# Patient Record
Sex: Male | Born: 1950
Health system: Southern US, Community
[De-identification: ages and names within clinical notes are randomized; demographics above are authoritative.]

## PROBLEM LIST (undated history)

## (undated) DIAGNOSIS — I10 Essential (primary) hypertension: Secondary | ICD-10-CM

## (undated) DIAGNOSIS — Z789 Other specified health status: Secondary | ICD-10-CM

## (undated) HISTORY — PX: EYE SURGERY: SHX253

---

## 2008-12-30 ENCOUNTER — Ambulatory Visit: Payer: Self-pay | Admitting: Ophthalmology

## 2009-07-27 DIAGNOSIS — D239 Other benign neoplasm of skin, unspecified: Secondary | ICD-10-CM

## 2009-07-27 HISTORY — DX: Other benign neoplasm of skin, unspecified: D23.9

## 2013-09-02 ENCOUNTER — Ambulatory Visit (INDEPENDENT_AMBULATORY_CARE_PROVIDER_SITE_OTHER): Payer: BC Managed Care – PPO | Admitting: Sports Medicine

## 2013-09-02 ENCOUNTER — Ambulatory Visit: Payer: Self-pay

## 2013-09-02 ENCOUNTER — Encounter: Payer: Self-pay | Admitting: Sports Medicine

## 2013-09-02 VITALS — BP 161/98 | HR 67 | Ht 79.0 in | Wt 240.0 lb

## 2013-09-02 DIAGNOSIS — M25551 Pain in right hip: Secondary | ICD-10-CM

## 2013-09-02 DIAGNOSIS — M1611 Unilateral primary osteoarthritis, right hip: Secondary | ICD-10-CM | POA: Insufficient documentation

## 2013-09-02 DIAGNOSIS — M25579 Pain in unspecified ankle and joints of unspecified foot: Secondary | ICD-10-CM

## 2013-09-02 DIAGNOSIS — M25559 Pain in unspecified hip: Secondary | ICD-10-CM

## 2013-09-02 DIAGNOSIS — M25571 Pain in right ankle and joints of right foot: Secondary | ICD-10-CM | POA: Insufficient documentation

## 2013-09-02 NOTE — Assessment & Plan Note (Signed)
-  Possibly related to altered biomechanics due to right hip pathology. -He was fitted for a body helix compression sleeve for the right ankle and reported good relief of symptoms. -We will follow-up his hip radiographs.

## 2013-09-02 NOTE — Progress Notes (Signed)
   Subjective:    Patient ID: Paul Acosta, male    DOB: February 20, 1950, 63 y.o.   MRN: [id number]  HPI Paul Acosta is a 63 year old male new patient who presents with right hip and some previous right ankle pain. Onset of symptoms was approximately 3 months ago. He denies any known acute injury. His pain has progressively worsened and will occasionally wake him up at night. Location of pain is primarily across the anterior hip and into his groin. He does have a history of some low back pain, but he denies any present symptoms. In addition, he reports a remote history of repetitive ankle inversion injuries, and had recently been having some right ankle pain that has since resolved. He notes that he previously saw a podiatrist, who fitted him for rather bulky ankle braces that he no longer wears. Pain is usually located over the lateral portion of his right ankle. He denies any ankle swelling. He denies any fevers, chills, malaise, or body aches. His symptoms are relieved with rest and aggravated with activity. He has not tried any interventions at this time. Severity of pain in the right hip has moderate.  History reviewed. No pertinent past medical history. History   Social History  . Marital Status: Married    Spouse Name: N/A    Number of Children: N/A  . Years of Education: N/A   Occupational History  . Not on file.   Social History Main Topics  . Smoking status: Never Smoker   . Smokeless tobacco: Not on file  . Alcohol Use: 1.2 oz/week    2 Cans of beer per week  . Drug Use: Not on file  . Sexual Activity: Not on file   Other Topics Concern  . Not on file   Social History Narrative  . No narrative on file    Review of Systems As per history of present illness. 11 point review systems was performed is otherwise negative.    Objective:   Physical Exam BP 161/98  Pulse 67  Ht 6\' 7"  (2.007 m)  Wt 240 lb (108.863 kg)  BMI 27.03 kg/m2 GEN: The patient is well-developed  well-nourished male and in no acute distress.  He is awake alert and oriented x3. SKIN: warm and well-perfused, no rash  EXTR: No lower extremity edema or calf tenderness Neuro: Strength 5/5 globally. Sensation intact throughout. DTRs 2/4 bilaterally. No focal deficits. Vasc: +2 bilateral distal pulses. No edema.  MSK:  Gen. inspection reveals a right foot which is held in eversion. Leg lengths equal at the medial malleoli. No obvious ankle swelling. Full range of motion without pain. +1 anterior drawer at the right ankle. Positive talar tilt test. Internal rotation at the right hip is approximately 20 with pain, 40 of the left hip without pain. No tenderness over the right greater trochanter. Negative pelvic compression test.     Assessment & Plan:  Please see problem based assessment and plan in the problem list.

## 2013-09-02 NOTE — Assessment & Plan Note (Signed)
Given the patient's significant pain, especially with internal rotation of the right hip, we will obtain x-rays of the right hip to evaluate for possible osteoarthritis. Less likely septic arthritis, as clinical history and evaluation today does not support this. -Followup x-rays right hip. -I will call the patient once I review these x-rays. If they are suggestive of osteoarthritis of the underlying etiology of the patient's pain, we will further discuss options. We did discuss the potential benefit of a trial of corticosteroid injection to the right hip today, which the patient may consider in the future. -Followup pending results of x-rays.

## 2013-09-03 ENCOUNTER — Telehealth: Payer: Self-pay | Admitting: Sports Medicine

## 2013-09-03 ENCOUNTER — Ambulatory Visit
Admission: RE | Admit: 2013-09-03 | Discharge: 2013-09-03 | Disposition: A | Discharge status: Home / Self Care | Payer: BC Managed Care – PPO | Source: Ambulatory Visit | Attending: Sports Medicine | Admitting: Sports Medicine

## 2013-09-03 DIAGNOSIS — M25551 Pain in right hip: Secondary | ICD-10-CM

## 2013-09-04 NOTE — Telephone Encounter (Signed)
I spoke with the patient on the phone today after reviewing x-rays of his right hip. He has moderately advanced DJD. I agree with Dr. Nolene EbbsArdis Hughs that we should schedule the patient for an ultrasound guided intra-articular hip injection. We will schedule this for sometime within the next couple of weeks. Patient is in agreement with this plan.

## 2013-09-04 NOTE — Telephone Encounter (Signed)
Message copied by Thurman Coyer on Thu Sep 04, 2013 11:01 AM ------      Message from: Babette Relic      Created: Thu Sep 04, 2013 10:47 AM      Contact: [phone]       [phone] ------

## 2013-09-08 ENCOUNTER — Ambulatory Visit: Payer: Self-pay | Admitting: Family Medicine

## 2013-09-09 ENCOUNTER — Encounter: Payer: Self-pay | Admitting: Sports Medicine

## 2013-09-09 ENCOUNTER — Ambulatory Visit (INDEPENDENT_AMBULATORY_CARE_PROVIDER_SITE_OTHER): Payer: BC Managed Care – PPO | Admitting: Sports Medicine

## 2013-09-09 VITALS — BP 159/99 | Ht 79.0 in | Wt 240.0 lb

## 2013-09-09 DIAGNOSIS — M25559 Pain in unspecified hip: Secondary | ICD-10-CM | POA: Diagnosis not present

## 2013-09-09 DIAGNOSIS — M25551 Pain in right hip: Secondary | ICD-10-CM

## 2013-09-09 MED ORDER — METHYLPREDNISOLONE ACETATE 40 MG/ML IJ SUSP
40.0000 mg | Freq: Once | INTRAMUSCULAR | Status: AC
Start: 2013-09-09 — End: 2013-09-09
  Administered 2013-09-09: 40 mg via INTRA_ARTICULAR

## 2013-09-09 NOTE — Assessment & Plan Note (Signed)
-  Intra-articular right hip CST inj performed under US guidance on 09/09/13. -Post-procedure precautions discussed. -Follow-up as needed.

## 2013-09-09 NOTE — Progress Notes (Signed)
Subjective: Paul Acosta is a 63yo male who presents with right hip OA, here today for an US guided CST injection of the right hip. He had X-rays performed since our last visit which we discussed over the phone as being suggestive of moderate OA of the right hip. His symptoms of deep groin pain persist and he wishes to try CST injection today.  Objective: BP 159/99  Ht 6\' 7"  (2.007 m)  Wt 240 lb (108.863 kg)  BMI 27.03 kg/m2 GEN: The patient is well-developed well-nourished male and in no acute distress.  He is awake alert and oriented x3. SKIN: warm and well-perfused, no rash  EXTR: No lower extremity edema or calf tenderness Neuro: Strength 5/5 globally. Sensation intact throughout. DTRs 2/4 bilaterally. No focal deficits. Vasc: +2 bilateral distal pulses. No edema.  MSK: Right hip pain with internal rotation to 15 degrees, with firm barrier.  Procedure: The procedure was explained to the patient in detail, all questions were answered.  After obtaining informed verbal consent, the right hip was sterilely prepped with alcohol. Doppler ultrasound was used to visualize the femoral vessels. 3cc's of 1% plain lidocaine were used to anesthetize a tract towards the right hip joint. Using strict sterile technique a 25 gauge, 1  inch needle was inserted under ultrasound guidance into the right femoral-acetabular joint capsule. A mixture of 40mg  of methylprednisolone and 3 cc's 1% lidocaine without epinephrine was injected without resistance. The needle was removed, and a sterile bandage was applied. The patient tolerated the procedure well and was observed for 5-10 minutes.  Post injection instructions, including signs and symptoms of complications were discussed.  Assessment: Right hip moderate OA, s/p CST inj today.  Plan: -Post-procedure cares discussed -Follow-up as needed

## 2013-09-30 ENCOUNTER — Ambulatory Visit (INDEPENDENT_AMBULATORY_CARE_PROVIDER_SITE_OTHER): Payer: BC Managed Care – PPO | Admitting: Sports Medicine

## 2013-09-30 ENCOUNTER — Encounter: Payer: Self-pay | Admitting: Sports Medicine

## 2013-09-30 VITALS — BP 148/80 | HR 70 | Ht 79.0 in | Wt 240.0 lb

## 2013-09-30 DIAGNOSIS — M1610 Unilateral primary osteoarthritis, unspecified hip: Secondary | ICD-10-CM

## 2013-09-30 DIAGNOSIS — M1611 Unilateral primary osteoarthritis, right hip: Secondary | ICD-10-CM

## 2013-09-30 MED ORDER — MELOXICAM 15 MG PO TABS: 15.0000 mg | ORAL_TABLET | Freq: Every day | ORAL | Status: DC

## 2013-09-30 MED ORDER — PREDNISONE 10 MG PO TABS: ORAL_TABLET | ORAL | Status: DC

## 2013-09-30 NOTE — Assessment & Plan Note (Signed)
Persistent right hip joint pain with suboptimal relief following corticosteroid injection in the right hip under ultrasound guidance. -We will try him on a Medrol Dosepak, followed by meloxicam as needed. I instructed him to discontinue taking Aleve. -We will arrange for him to see Dr. Mayer Camel from orthopedic surgery for a consultation regarding surgical intervention for the right hip osteoarthritis. -Paul Acosta will followup as needed with [place].

## 2013-09-30 NOTE — Progress Notes (Signed)
   Subjective:    Patient ID: Paul Acosta, male    DOB: 08/26/1950, 63 y.o.   MRN: [id number]  HPI Mr. Baines is a 63 year old male who presents for follow-up of right hip pain. We trialed a CST injection under US guidance on 09/09/13. He says provided about 3-4 days of relief with the injection. Onset of right hip pain was approximately 4 months ago. Location of pain is primarily the right deep groin. He has also tried Aleve with little relief. This pain will occasionally wake him up at night. He enjoys golfing, and his hip pain is now affecting his golf game. He denies any associated fever, chills, numbness, weakness, or significant low back pain.  X-rays of the right hip were reviewed which were significant for moderate osteoarthritis of the right hip.  Past medical, social, medications, and allergies were reviewed today and are up-to-date in the chart.  Review of Systems 6 point review of systems as pertains to the patient's chief complaint was performed and is otherwise negative unless noted in history of present illness.    Objective:   Physical Exam BP 148/80  Pulse 70  Ht 6\' 7"  (2.007 m)  Wt 240 lb (108.863 kg)  BMI 27.03 kg/m2 GEN: The patient is well-developed well-nourished male and in no acute distress.  He is awake alert and oriented x3. SKIN: warm and well-perfused, no rash  EXTR: No lower extremity edema or calf tenderness Neuro: Strength 5/5 globally. Sensation intact throughout. DTRs 2/4 bilaterally. No focal deficits. Vasc: +2 bilateral distal pulses. No edema.  MSK: Examination of the right hip reveals significant limitation of range of motion to just about 15-20 of internal rotation with pain. He has positive impingement signs of the hip. He has no tenderness over the greater trochanter or iliotibial band. He has no tenderness in the lumbosacral spine and exhibits good range of motion with lumbar flexion/extension, rotation, and side bending. He has no leg length  discrepancy.     Assessment & Plan:  Please see problem based assessment and plan in the problem list.

## 2013-09-30 NOTE — Patient Instructions (Signed)
[facility]OW[facility] TUES 10/07/13 @ [place] [phone]

## 2013-10-14 ENCOUNTER — Other Ambulatory Visit: Payer: Self-pay | Admitting: Orthopedic Surgery

## 2013-10-21 ENCOUNTER — Other Ambulatory Visit: Payer: Self-pay | Admitting: Orthopedic Surgery

## 2013-10-24 ENCOUNTER — Encounter (HOSPITAL_COMMUNITY): Payer: Self-pay | Admitting: Pharmacy Technician

## 2013-10-29 NOTE — Pre-Procedure Instructions (Signed)
JAMALL STROHMEIER  10/29/2013   Your procedure is scheduled on:  Friday, November 07, 2013  Report to [facility] Entrance "[facility] [place] at 10:30 AM.  Call this number if you have problems the morning of surgery: [phone]   Remember:   Do not eat food or drink liquids after midnight.   Take these medicines the morning of surgery with A SIP OF WATER: None  STOP taking Mobic (Meloxicam), Aspirin, Goody's, BC's, Aleve (Naproxen), Ibuprofen (Advil or Motrin), Fish Oil, Vitamins, Herbal Supplements or any substance that could thin your blood starting Friday October 31, 2013 or atleast 5 days prior to surgery.   Do not wear jewelry.  Do not wear lotions, powders, or colognes. You may wear deodorant.  Do not shave 48 hours prior to surgery. Men may shave face and neck.  Do not bring valuables to the hospital.  [facility] is not responsible                  for any belongings or valuables.               Contacts, dentures or bridgework may not be worn into surgery.  Leave suitcase in the car. After surgery it may be brought to your room.  For patients admitted to the hospital, discharge time is determined by your                treatment team.                Special Instructions: Please use CHG soap the night before surgery and the day of surgery. CHG soap should be used atleast twice.   Please read over the following fact sheets that you were given: Pain Booklet, Coughing and Deep Breathing, Blood Transfusion Information, Total Joint Packet, MRSA Information and Surgical Site Infection Prevention.

## 2013-10-30 ENCOUNTER — Encounter (HOSPITAL_COMMUNITY)
Admission: RE | Admit: 2013-10-30 | Discharge: 2013-10-30 | Disposition: A | Discharge status: Home / Self Care | Payer: BC Managed Care – PPO | Source: Ambulatory Visit | Attending: Orthopedic Surgery | Admitting: Orthopedic Surgery

## 2013-10-30 ENCOUNTER — Encounter (HOSPITAL_COMMUNITY): Payer: Self-pay

## 2013-10-30 DIAGNOSIS — Z0000 Encounter for general adult medical examination without abnormal findings: Secondary | ICD-10-CM | POA: Insufficient documentation

## 2013-10-30 DIAGNOSIS — M1611 Unilateral primary osteoarthritis, right hip: Secondary | ICD-10-CM | POA: Diagnosis not present

## 2013-10-30 DIAGNOSIS — Z01818 Encounter for other preprocedural examination: Secondary | ICD-10-CM

## 2013-10-30 HISTORY — DX: Other specified health status: Z78.9

## 2013-10-30 LAB — TYPE AND SCREEN
ABO/RH(D): A POS
ANTIBODY SCREEN: NEGATIVE

## 2013-10-30 LAB — ABO/RH: ABO/RH(D): A POS

## 2013-10-30 LAB — SURGICAL PCR SCREEN
MRSA, PCR: NEGATIVE
STAPHYLOCOCCUS AUREUS: NEGATIVE

## 2013-10-31 LAB — URINE CULTURE
CULTURE: NO GROWTH
Colony Count: NO GROWTH

## 2013-11-06 MED ORDER — DEXTROSE 5 % IV SOLN
3.0000 g | INTRAVENOUS | Status: AC
  Administered 2013-11-07: 3 g via INTRAVENOUS
  Filled 2013-11-06: qty 3000

## 2013-11-06 NOTE — H&P (Signed)
TOTAL HIP ADMISSION H&P  Patient is admitted for right total hip arthroplasty.  Subjective:  Chief Complaint: right hip pain  HPI: Paul Acosta, 63 y.o. male, has a history of pain and functional disability in the right hip(s) due to arthritis and patient has failed non-surgical conservative treatments for greater than 12 weeks to include NSAID's and/or analgesics, corticosteriod injections, use of assistive devices, weight reduction as appropriate and activity modification.  Onset of symptoms was gradual starting 5 years ago with gradually worsening course since that time.The patient noted no past surgery on the right hip(s).  Patient currently rates pain in the right hip at 10 out of 10 with activity. Patient has night pain, pain that interfers with activities of daily living and pain with passive range of motion. Patient has evidence of periarticular osteophytes, joint subluxation and joint space narrowing by imaging studies. This condition presents safety issues increasing the risk of falls.  There is no current active infection.  Patient Active Problem List   Diagnosis Date Noted  . Osteoarthritis of right hip 09/02/2013  . Right ankle pain 09/02/2013   Past Medical History  Diagnosis Date  . Medical history non-contributory     Past Surgical History  Procedure Laterality Date  . Eye surgery      No prescriptions prior to admission   No Known Allergies  History  Substance Use Topics  . Smoking status: Never Smoker   . Smokeless tobacco: Not on file  . Alcohol Use: 1.2 oz/week    2 Cans of beer per week    No family history on file.   Review of Systems  Constitutional: Negative.   HENT: Negative.   Eyes: Negative.   Respiratory: Negative.   Cardiovascular: Negative.   Gastrointestinal: Negative.   Genitourinary: Negative.   Musculoskeletal: Positive for joint pain.  Skin: Negative.   Neurological: Negative.   Endo/Heme/Allergies: Negative.   Psychiatric/Behavioral:  Negative.     Objective:  Physical Exam  Constitutional: He is oriented to person, place, and time. He appears well-developed and well-nourished.  HENT:  Head: Normocephalic and atraumatic.  Eyes: Pupils are equal, round, and reactive to light.  Neck: Normal range of motion. Neck supple.  Cardiovascular: Intact distal pulses.   Respiratory: Effort normal.  Musculoskeletal: He exhibits tenderness.  Severe pain with internal rotation of the right hip external rotation is to 20, foot tap is negative.  He does have a significant right-sided limp.    Neurological: He is alert and oriented to person, place, and time.  Skin: Skin is warm and dry.  Psychiatric: He has a normal mood and affect. His behavior is normal. Judgment and thought content normal.    Vital signs in last 24 hours:    Labs:   Estimated body mass index is 27.03 kg/(m^2) as calculated from the following:   Height as of 09/30/13: 6\' 7"  (2.007 m).   Weight as of 09/30/13: 108.863 kg (240 lb).   Imaging Review Plain radiographs demonstrate near bone-on-bone arthritis to the weightbearing area of the right hip as well as lateral osteophytes and lateral subluxation of the femoral head out of the joint 1 cm.    Assessment/Plan:  End stage arthritis, right hip(s)  The patient history, physical examination, clinical judgement of the provider and imaging studies are consistent with end stage degenerative joint disease of the right hip(s) and total hip arthroplasty is deemed medically necessary. The treatment options including medical management, injection therapy, arthroscopy and arthroplasty were discussed  at length. The risks and benefits of total hip arthroplasty were presented and reviewed. The risks due to aseptic loosening, infection, stiffness, dislocation/subluxation,  thromboembolic complications and other imponderables were discussed.  The patient acknowledged the explanation, agreed to proceed with the plan and  consent was signed. Patient is being admitted for inpatient treatment for surgery, pain control, PT, OT, prophylactic antibiotics, VTE prophylaxis, progressive ambulation and ADL's and discharge planning.The patient is planning to be discharged home with home health services

## 2013-11-07 ENCOUNTER — Encounter (HOSPITAL_COMMUNITY)
Admission: RE | Disposition: A | Discharge status: Home Health | Payer: Self-pay | Source: Ambulatory Visit | Attending: Orthopedic Surgery

## 2013-11-07 ENCOUNTER — Inpatient Hospital Stay (HOSPITAL_COMMUNITY): Payer: BC Managed Care – PPO | Admitting: Certified Registered Nurse Anesthetist

## 2013-11-07 ENCOUNTER — Inpatient Hospital Stay (HOSPITAL_COMMUNITY): Payer: BC Managed Care – PPO

## 2013-11-07 ENCOUNTER — Encounter (HOSPITAL_COMMUNITY): Payer: BC Managed Care – PPO | Admitting: Certified Registered Nurse Anesthetist

## 2013-11-07 ENCOUNTER — Inpatient Hospital Stay (HOSPITAL_COMMUNITY)
Admission: RE | Admit: 2013-11-07 | Discharge: 2013-11-09 | DRG: 470 | Disposition: A | Discharge status: Home Health | Payer: BC Managed Care – PPO | Source: Ambulatory Visit | Attending: Orthopedic Surgery | Admitting: Orthopedic Surgery

## 2013-11-07 ENCOUNTER — Encounter (HOSPITAL_COMMUNITY): Payer: Self-pay | Admitting: *Deleted

## 2013-11-07 DIAGNOSIS — M1611 Unilateral primary osteoarthritis, right hip: Principal | ICD-10-CM | POA: Diagnosis present

## 2013-11-07 DIAGNOSIS — M1610 Unilateral primary osteoarthritis, unspecified hip: Secondary | ICD-10-CM | POA: Diagnosis present

## 2013-11-07 DIAGNOSIS — M25551 Pain in right hip: Secondary | ICD-10-CM | POA: Diagnosis present

## 2013-11-07 DIAGNOSIS — Z96649 Presence of unspecified artificial hip joint: Secondary | ICD-10-CM

## 2013-11-07 HISTORY — PX: TOTAL HIP ARTHROPLASTY: SHX124

## 2013-11-07 LAB — BASIC METABOLIC PANEL
Anion gap: 13 (ref 5–15)
BUN: 15 mg/dL (ref 6–23)
CHLORIDE: 102 meq/L (ref 96–112)
CO2: 25 meq/L (ref 19–32)
Calcium: 9.1 mg/dL (ref 8.4–10.5)
Creatinine, Ser: 0.91 mg/dL (ref 0.50–1.35)
GFR calc Af Amer: >90 mL/min (ref >90)
GFR calc non Af Amer: 89 mL/min — ABNORMAL LOW (ref >90)
Glucose, Bld: 90 mg/dL (ref 70–99)
POTASSIUM: 4.9 meq/L (ref 3.7–5.3)
Sodium: 140 mEq/L (ref 137–147)

## 2013-11-07 LAB — CBC
HCT: 42.7 % (ref 39.0–52.0)
Hemoglobin: 15.1 g/dL (ref 13.0–17.0)
MCH: 30 pg (ref 26.0–34.0)
MCHC: 35.4 g/dL (ref 30.0–36.0)
MCV: 84.9 fL (ref 78.0–100.0)
PLATELETS: 200 10*3/uL (ref 150–400)
RBC: 5.03 MIL/uL (ref 4.22–5.81)
RDW: 13.3 % (ref 11.5–15.5)
WBC: 4.1 10*3/uL (ref 4.0–10.5)

## 2013-11-07 LAB — APTT: aPTT: 29 seconds (ref 24–37)

## 2013-11-07 LAB — PROTIME-INR
INR: 0.96 (ref 0.00–1.49)
Prothrombin Time: 12.9 seconds (ref 11.6–15.2)

## 2013-11-07 SURGERY — ARTHROPLASTY, HIP, TOTAL,POSTERIOR APPROACH
Anesthesia: Spinal | Site: Hip | Laterality: Right

## 2013-11-07 MED ORDER — ONDANSETRON HCL 4 MG/2ML IJ SOLN
INTRAMUSCULAR | Status: AC
  Filled 2013-11-07: qty 2

## 2013-11-07 MED ORDER — SENNOSIDES-DOCUSATE SODIUM 8.6-50 MG PO TABS: 1.0000 | ORAL_TABLET | Freq: Every evening | ORAL | Status: DC | PRN

## 2013-11-07 MED ORDER — FLEET ENEMA 7-19 GM/118ML RE ENEM
1.0000 | ENEMA | Freq: Once | RECTAL | Status: AC | PRN
Start: 2013-11-07 — End: 2013-11-07

## 2013-11-07 MED ORDER — ONDANSETRON HCL 4 MG/2ML IJ SOLN
4.0000 mg | Freq: Four times a day (QID) | INTRAMUSCULAR | Status: DC | PRN
  Administered 2013-11-08: 4 mg via INTRAVENOUS
  Filled 2013-11-07 (×2): qty 2

## 2013-11-07 MED ORDER — SODIUM CHLORIDE 0.9 % IR SOLN
Status: DC | PRN
  Administered 2013-11-07: 1000 mL

## 2013-11-07 MED ORDER — KCL IN DEXTROSE-NACL 20-5-0.45 MEQ/L-%-% IV SOLN
INTRAVENOUS | Status: DC
  Administered 2013-11-07: 125 mL/h via INTRAVENOUS
  Administered 2013-11-08: 03:00:00 via INTRAVENOUS
  Filled 2013-11-07 (×7): qty 1000

## 2013-11-07 MED ORDER — ACETAMINOPHEN 160 MG/5ML PO SOLN: 325.0000 mg | ORAL | Status: DC | PRN

## 2013-11-07 MED ORDER — CEFUROXIME SODIUM 1.5 G IJ SOLR
INTRAMUSCULAR | Status: AC
  Filled 2013-11-07: qty 1.5

## 2013-11-07 MED ORDER — PHENYLEPHRINE HCL 10 MG/ML IJ SOLN
INTRAMUSCULAR | Status: DC | PRN
  Administered 2013-11-07 (×3): 40 ug via INTRAVENOUS
  Administered 2013-11-07: 80 ug via INTRAVENOUS

## 2013-11-07 MED ORDER — CEFAZOLIN SODIUM-DEXTROSE 2-3 GM-% IV SOLR: 2.0000 g | INTRAVENOUS | Status: DC

## 2013-11-07 MED ORDER — TRANEXAMIC ACID 100 MG/ML IV SOLN
1000.0000 mg | INTRAVENOUS | Status: AC
  Administered 2013-11-07: 1000 mg via INTRAVENOUS
  Filled 2013-11-07: qty 10

## 2013-11-07 MED ORDER — DOCUSATE SODIUM 100 MG PO CAPS
100.0000 mg | ORAL_CAPSULE | Freq: Two times a day (BID) | ORAL | Status: DC
  Administered 2013-11-07 – 2013-11-09 (×4): 100 mg via ORAL
  Filled 2013-11-07 (×5): qty 1

## 2013-11-07 MED ORDER — BISACODYL 5 MG PO TBEC: 5.0000 mg | DELAYED_RELEASE_TABLET | Freq: Every day | ORAL | Status: DC | PRN

## 2013-11-07 MED ORDER — PHENOL 1.4 % MT LIQD: 1.0000 | OROMUCOSAL | Status: DC | PRN

## 2013-11-07 MED ORDER — METOCLOPRAMIDE HCL 10 MG PO TABS: 5.0000 mg | ORAL_TABLET | Freq: Three times a day (TID) | ORAL | Status: DC | PRN

## 2013-11-07 MED ORDER — ACETAMINOPHEN 325 MG PO TABS: 325.0000 mg | ORAL_TABLET | ORAL | Status: DC | PRN

## 2013-11-07 MED ORDER — FENTANYL CITRATE 0.05 MG/ML IJ SOLN
INTRAMUSCULAR | Status: DC | PRN
  Administered 2013-11-07: 50 ug via INTRAVENOUS
  Administered 2013-11-07: 100 ug via INTRAVENOUS
  Administered 2013-11-07: 50 ug via INTRAVENOUS

## 2013-11-07 MED ORDER — METOCLOPRAMIDE HCL 5 MG/ML IJ SOLN: 5.0000 mg | Freq: Three times a day (TID) | INTRAMUSCULAR | Status: DC | PRN

## 2013-11-07 MED ORDER — PROPOFOL 10 MG/ML IV BOLUS
INTRAVENOUS | Status: AC
  Filled 2013-11-07: qty 20

## 2013-11-07 MED ORDER — MENTHOL 3 MG MT LOZG: 1.0000 | LOZENGE | OROMUCOSAL | Status: DC | PRN

## 2013-11-07 MED ORDER — KCL IN DEXTROSE-NACL 20-5-0.2 MEQ/L-%-% IV SOLN: INTRAVENOUS | Status: DC

## 2013-11-07 MED ORDER — BUPIVACAINE-EPINEPHRINE 0.5% -1:200000 IJ SOLN
INTRAMUSCULAR | Status: DC | PRN
  Administered 2013-11-07: 20 mL

## 2013-11-07 MED ORDER — ACETAMINOPHEN 325 MG PO TABS: 650.0000 mg | ORAL_TABLET | Freq: Four times a day (QID) | ORAL | Status: DC | PRN

## 2013-11-07 MED ORDER — LIDOCAINE HCL (CARDIAC) 20 MG/ML IV SOLN
INTRAVENOUS | Status: AC
  Filled 2013-11-07: qty 5

## 2013-11-07 MED ORDER — OXYCODONE HCL 5 MG PO TABS
5.0000 mg | ORAL_TABLET | ORAL | Status: DC | PRN
  Administered 2013-11-07 – 2013-11-08 (×2): 5 mg via ORAL
  Filled 2013-11-07 (×2): qty 1

## 2013-11-07 MED ORDER — BUPIVACAINE-EPINEPHRINE (PF) 0.5% -1:200000 IJ SOLN
INTRAMUSCULAR | Status: AC
  Filled 2013-11-07: qty 30

## 2013-11-07 MED ORDER — ASPIRIN EC 325 MG PO TBEC: 325.0000 mg | DELAYED_RELEASE_TABLET | Freq: Two times a day (BID) | ORAL | Status: DC

## 2013-11-07 MED ORDER — MIDAZOLAM HCL 2 MG/2ML IJ SOLN
INTRAMUSCULAR | Status: AC
  Filled 2013-11-07: qty 2

## 2013-11-07 MED ORDER — ASPIRIN EC 325 MG PO TBEC
325.0000 mg | DELAYED_RELEASE_TABLET | Freq: Every day | ORAL | Status: DC
  Administered 2013-11-08 – 2013-11-09 (×2): 325 mg via ORAL
  Filled 2013-11-07 (×3): qty 1

## 2013-11-07 MED ORDER — METHOCARBAMOL 500 MG PO TABS: 500.0000 mg | ORAL_TABLET | Freq: Two times a day (BID) | ORAL | Status: DC

## 2013-11-07 MED ORDER — OXYCODONE HCL 5 MG/5ML PO SOLN: 5.0000 mg | Freq: Once | ORAL | Status: DC | PRN

## 2013-11-07 MED ORDER — FENTANYL CITRATE 0.05 MG/ML IJ SOLN
INTRAMUSCULAR | Status: AC
Start: 2013-11-07 — End: 2013-11-07
  Filled 2013-11-07: qty 5

## 2013-11-07 MED ORDER — OXYCODONE HCL 5 MG PO TABS: 5.0000 mg | ORAL_TABLET | Freq: Once | ORAL | Status: DC | PRN

## 2013-11-07 MED ORDER — ONDANSETRON HCL 4 MG/2ML IJ SOLN
INTRAMUSCULAR | Status: DC | PRN
  Administered 2013-11-07: 4 mg via INTRAVENOUS

## 2013-11-07 MED ORDER — PROPOFOL INFUSION 10 MG/ML OPTIME
INTRAVENOUS | Status: DC | PRN
  Administered 2013-11-07: 50 ug/kg/min via INTRAVENOUS

## 2013-11-07 MED ORDER — METHOCARBAMOL 1000 MG/10ML IJ SOLN
500.0000 mg | Freq: Four times a day (QID) | INTRAVENOUS | Status: DC | PRN
  Filled 2013-11-07: qty 5

## 2013-11-07 MED ORDER — LACTATED RINGERS IV SOLN
INTRAVENOUS | Status: DC
  Administered 2013-11-07 (×3): via INTRAVENOUS

## 2013-11-07 MED ORDER — ONDANSETRON HCL 4 MG PO TABS: 4.0000 mg | ORAL_TABLET | Freq: Four times a day (QID) | ORAL | Status: DC | PRN

## 2013-11-07 MED ORDER — HYDROMORPHONE HCL 1 MG/ML IJ SOLN
0.5000 mg | INTRAMUSCULAR | Status: DC | PRN
  Administered 2013-11-07 – 2013-11-08 (×5): 1 mg via INTRAVENOUS
  Filled 2013-11-07 (×5): qty 1

## 2013-11-07 MED ORDER — HYDROMORPHONE HCL 1 MG/ML IJ SOLN: 0.2500 mg | INTRAMUSCULAR | Status: DC | PRN

## 2013-11-07 MED ORDER — ROCURONIUM BROMIDE 50 MG/5ML IV SOLN
INTRAVENOUS | Status: AC
  Filled 2013-11-07: qty 1

## 2013-11-07 MED ORDER — BUPIVACAINE HCL (PF) 0.5 % IJ SOLN
INTRAMUSCULAR | Status: DC | PRN
  Administered 2013-11-07: .5 mL

## 2013-11-07 MED ORDER — ARTIFICIAL TEARS OP OINT
TOPICAL_OINTMENT | OPHTHALMIC | Status: AC
  Filled 2013-11-07: qty 3.5

## 2013-11-07 MED ORDER — ACETAMINOPHEN 650 MG RE SUPP
650.0000 mg | Freq: Four times a day (QID) | RECTAL | Status: DC | PRN
Start: 2013-11-07 — End: 2013-11-09

## 2013-11-07 MED ORDER — MIDAZOLAM HCL 5 MG/5ML IJ SOLN
INTRAMUSCULAR | Status: DC | PRN
  Administered 2013-11-07 (×2): 2 mg via INTRAVENOUS

## 2013-11-07 MED ORDER — ALUMINUM HYDROXIDE GEL 320 MG/5ML PO SUSP
15.0000 mL | ORAL | Status: DC | PRN
  Filled 2013-11-07: qty 30

## 2013-11-07 MED ORDER — DIPHENHYDRAMINE HCL 12.5 MG/5ML PO ELIX: 12.5000 mg | ORAL_SOLUTION | ORAL | Status: DC | PRN

## 2013-11-07 MED ORDER — OXYCODONE-ACETAMINOPHEN 5-325 MG PO TABS
1.0000 | ORAL_TABLET | ORAL | Status: DC | PRN
Start: 2013-11-07 — End: 2013-11-09

## 2013-11-07 MED ORDER — METHOCARBAMOL 500 MG PO TABS
500.0000 mg | ORAL_TABLET | Freq: Four times a day (QID) | ORAL | Status: DC | PRN
  Administered 2013-11-08: 500 mg via ORAL
  Filled 2013-11-07 (×3): qty 1

## 2013-11-07 SURGICAL SUPPLY — 58 items
BLADE SAW SGTL 18X1.27X75 (BLADE) ×2 IMPLANT
BLADE SAW SGTL 18X1.27X75MM (BLADE) ×1
BRUSH FEMORAL CANAL (MISCELLANEOUS) IMPLANT
CAPT HIP PF COP ×3 IMPLANT
COVER BACK TABLE 24X17X13 BIG (DRAPES) IMPLANT
COVER SURGICAL LIGHT HANDLE (MISCELLANEOUS) ×6 IMPLANT
DRAPE ORTHO SPLIT 77X108 STRL (DRAPES) ×2
DRAPE PROXIMA HALF (DRAPES) ×3 IMPLANT
DRAPE SURG ORHT 6 SPLT 77X108 (DRAPES) ×1 IMPLANT
DRAPE U-SHAPE 47X51 STRL (DRAPES) ×3 IMPLANT
DRILL BIT 7/64X5 (BIT) ×3 IMPLANT
DRSG AQUACEL AG ADV 3.5X10 (GAUZE/BANDAGES/DRESSINGS) ×3 IMPLANT
DURAPREP 26ML APPLICATOR (WOUND CARE) ×3 IMPLANT
ELECT BLADE 4.0 EZ CLEAN MEGAD (MISCELLANEOUS)
ELECT REM PT RETURN 9FT ADLT (ELECTROSURGICAL) ×3
ELECTRODE BLDE 4.0 EZ CLN MEGD (MISCELLANEOUS) IMPLANT
ELECTRODE REM PT RTRN 9FT ADLT (ELECTROSURGICAL) ×1 IMPLANT
GAUZE XEROFORM 1X8 LF (GAUZE/BANDAGES/DRESSINGS) ×3 IMPLANT
GLOVE BIO SURGEON STRL SZ 6.5 (GLOVE) ×2 IMPLANT
GLOVE BIO SURGEON STRL SZ7.5 (GLOVE) ×3 IMPLANT
GLOVE BIO SURGEON STRL SZ8.5 (GLOVE) ×6 IMPLANT
GLOVE BIO SURGEONS STRL SZ 6.5 (GLOVE) ×1
GLOVE BIOGEL PI IND STRL 6.5 (GLOVE) ×1 IMPLANT
GLOVE BIOGEL PI IND STRL 8 (GLOVE) ×2 IMPLANT
GLOVE BIOGEL PI IND STRL 9 (GLOVE) ×1 IMPLANT
GLOVE BIOGEL PI INDICATOR 6.5 (GLOVE) ×2
GLOVE BIOGEL PI INDICATOR 8 (GLOVE) ×4
GLOVE BIOGEL PI INDICATOR 9 (GLOVE) ×2
GLOVE SURG SS PI 6.5 STRL IVOR (GLOVE) ×3 IMPLANT
GOWN STRL REUS W/ TWL LRG LVL3 (GOWN DISPOSABLE) ×2 IMPLANT
GOWN STRL REUS W/ TWL XL LVL3 (GOWN DISPOSABLE) ×3 IMPLANT
GOWN STRL REUS W/TWL LRG LVL3 (GOWN DISPOSABLE) ×4
GOWN STRL REUS W/TWL XL LVL3 (GOWN DISPOSABLE) ×6
HANDPIECE INTERPULSE COAX TIP (DISPOSABLE)
HOOD PEEL AWAY FACE SHEILD DIS (HOOD) ×6 IMPLANT
KIT BASIN OR (CUSTOM PROCEDURE TRAY) ×3 IMPLANT
KIT ROOM TURNOVER OR (KITS) ×3 IMPLANT
MANIFOLD NEPTUNE II (INSTRUMENTS) ×3 IMPLANT
NEEDLE 22X1 1/2 (OR ONLY) (NEEDLE) ×3 IMPLANT
NS IRRIG 1000ML POUR BTL (IV SOLUTION) ×3 IMPLANT
PACK TOTAL JOINT (CUSTOM PROCEDURE TRAY) ×3 IMPLANT
PAD ARMBOARD 7.5X6 YLW CONV (MISCELLANEOUS) ×6 IMPLANT
PASSER SUT SWANSON 36MM LOOP (INSTRUMENTS) ×3 IMPLANT
PRESSURIZER FEMORAL UNIV (MISCELLANEOUS) IMPLANT
SET HNDPC FAN SPRY TIP SCT (DISPOSABLE) IMPLANT
SUT ETHIBOND 2 V 37 (SUTURE) ×3 IMPLANT
SUT VIC AB 0 CTB1 27 (SUTURE) ×3 IMPLANT
SUT VIC AB 1 CTX 36 (SUTURE) ×2
SUT VIC AB 1 CTX36XBRD ANBCTR (SUTURE) ×1 IMPLANT
SUT VIC AB 2-0 CTB1 (SUTURE) ×3 IMPLANT
SUT VIC AB 3-0 SH 27 (SUTURE) ×2
SUT VIC AB 3-0 SH 27X BRD (SUTURE) ×1 IMPLANT
SYR CONTROL 10ML LL (SYRINGE) ×3 IMPLANT
TOWEL OR 17X24 6PK STRL BLUE (TOWEL DISPOSABLE) ×3 IMPLANT
TOWEL OR 17X26 10 PK STRL BLUE (TOWEL DISPOSABLE) ×3 IMPLANT
TOWER CARTRIDGE SMART MIX (DISPOSABLE) IMPLANT
TRAY FOLEY CATH 14FR (SET/KITS/TRAYS/PACK) IMPLANT
WATER STERILE IRR 1000ML POUR (IV SOLUTION) ×12 IMPLANT

## 2013-11-07 NOTE — Op Note (Signed)
OPERATIVE REPORT    DATE OF PROCEDURE:  11/07/2013       PREOPERATIVE DIAGNOSIS:  right hip arthritis                                                          POSTOPERATIVE DIAGNOSIS:  right hip arthritis                                                           PROCEDURE:  R total hip arthroplasty using a 56 mm DePuy Pinnacle  Cup, [employer], 10-degree polyethylene liner index superior  and posterior, a +3 36 mm ceramic head, a 20x15x42x150 SROM stem, 20FXXL Sleeve   SURGEON: Deshana Rominger J    ASSISTANT:   Eric K. Barton Dubois  (present throughout entire procedure and necessary for timely completion of the procedure)   ANESTHESIA: General BLOOD LOSS: 400 FLUID REPLACEMENT: 2000 crystalloid DRAINS: Foley Catheter URINE OUTPUT: 588FO COMPLICATIONS: none    INDICATIONS FOR PROCEDURE: A 63 y.o. year-old With  right hip arthritis   for 4 years, x-rays show bone-on-bone arthritic changes. Despite conservative measures with observation, anti-inflammatory medicine, narcotics, use of a cane, has severe unremitting pain and can ambulate only a few blocks before resting.  Patient desires elective R total hip arthroplasty to decrease pain and increase function. The risks, benefits, and alternatives were discussed at length including but not limited to the risks of infection, bleeding, nerve injury, stiffness, blood clots, the need for revision surgery, cardiopulmonary complications, among others, and they were willing to proceed. Questions answered     PROCEDURE IN DETAIL: The patient was identified by armband,  received preoperative IV antibiotics in the holding area at [facility], taken to the operating room , appropriate anesthetic monitors  were attached and general endotracheal anesthesia induced. Foley catheter was inserted. Pt was rolled into the L lateral decubitus position and fixed there with a Stulberg Mark II pelvic clamp.  The R lower extremity was then prepped  and draped  in the usual sterile fashion from the ankle to the hemipelvis. A time-out  procedure was performed. The skin along the lateral hip and thigh  infiltrated with 10 mL of 0.5% Marcaine and epinephrine solution. We  then made a posterolateral approach to the hip. With a #10 blade, a 16 cm  incision was made through the skin and subcutaneous tissue down to the level of the  IT band. Small bleeders were identified and cauterized. The IT band was cut in  line with skin incision exposing the greater trochanter. A Cobra retractor was placed between the gluteus minimus and the superior hip joint capsule, and a spiked Cobra between the quadratus femoris and the inferior hip joint capsule. This isolated the short  external rotators and piriformis tendons. These were tagged with a #2 Ethibond  suture and cut off their insertion on the intertrochanteric crest. The posterior  capsule was then developed into an acetabular-based flap from Posterior Superior off of the acetabulum out over the femoral neck and back posterior inferior to the acetabular rim. This flap was tagged with two #2 Ethibond  sutures and retracted protecting the sciatic nerve. This exposed the arthritic femoral head and osteophytes. The hip was then flexed and internally rotated, dislocating the femoral head and a standard neck cut performed 1 fingerbreadth above the lesser trochanter.  A spiked Cobra was placed in the cotyloid notch and a Hohmann retractor was then used to lever the femur anteriorly off of the anterior pelvic column. A posterior-inferior wing retractor was placed at the junction of the acetabulum and the ischium completing the acetabular exposure.We then removed the peripheral osteophytes and labrum from the acetabulum. We then reamed the acetabulum up to 55 mm with basket reamers obtaining good coverage in all quadrants. We then irrigated with normal  saline solution and hammered into place a 56 mm pinnacle cup in 45   degrees of abduction and about 20 degrees of anteversion. More  peripheral osteophytes removed and a trial 10-degree liner placed with the  index superior-posterior. The hip was then flexed and internally rotated exposing the  proximal femur, which was entered with the initiating reamer followed by  the axial reamers up to a 15.5 mm full depth and 62mm partial depth. We then conically reamed to 64F to the correct depth for a 42 base neck. The calcar was milled to 64FXXL. A trial cone and stem was inserted in the 25 degrees anteversion, with a +0 71mm trial head. Trial reduction was then performed and excellent stability was noted with at 90 of flexion with 75 of internal rotation and then full extension with maximal external rotation. The hip could not be dislocated in full extension. The knee could easily flex  to about 130 degrees. We also stretched the abductors at this point,  because of the preexisting adductor contractures. All trial components  were then removed. The acetabulum was irrigated out with normal saline  solution. A titanium [facility] was then screwed into place  followed by a 10-degree polyethylene liner index superior-posterior. On  the femoral side a 64FXXL ZTT1 sleeve was hammered into place, followed by a 20x25x42x150 SROM stem in 25 degrees of anteversion. At this point, a +3 36 mm ceramic head was  hammered on the stem. The hip was reduced. We checked our stability  one more time and found it to be excellent. The wound was once again  thoroughly irrigated out with normal saline solution pulse lavage. The  capsular flap and short external rotators were repaired back to the  intertrochanteric crest through drill holes with a #2 Ethibond suture.  The IT band was closed with running 1 Vicryl suture. The subcutaneous  tissue with 0 and 2-0 undyed Vicryl suture and the skin with running  interlocking 3-0 nylon suture. Dressing of Xeroform and Mepilex was  then  applied. The patient was then unclamped, rolled supine, awaken extubated and taken to recovery room without difficulty in stable condition.   Frederik Pear J 11/07/2013, 2:12 PM

## 2013-11-07 NOTE — Anesthesia Preprocedure Evaluation (Addendum)
Anesthesia Evaluation  Patient identified by MRN, date of birth, ID band Patient awake    Reviewed: Allergy & Precautions, H&P , NPO status , Patient's Chart, lab work & pertinent test results  History of Anesthesia Complications Negative for: history of anesthetic complications  Airway Mallampati: II TM Distance: >3 FB Neck ROM: Full    Dental  (+) Teeth Intact   Pulmonary neg pulmonary ROS,  breath sounds clear to auscultation        Cardiovascular negative cardio ROS  Rhythm:Regular     Neuro/Psych negative neurological ROS  negative psych ROS   GI/Hepatic negative GI ROS, Neg liver ROS,   Endo/Other  negative endocrine ROS  Renal/GU negative Renal ROS     Musculoskeletal  (+) Arthritis -, Osteoarthritis,    Abdominal   Peds  Hematology negative hematology ROS (+)   Anesthesia Other Findings   Reproductive/Obstetrics                          Anesthesia Physical Anesthesia Plan  ASA: I  Anesthesia Plan: Spinal   Post-op Pain Management:    Induction: Intravenous  Airway Management Planned: Natural Airway and Simple Face Mask  Additional Equipment: None  Intra-op Plan:   Post-operative Plan:   Informed Consent: I have reviewed the patients History and Physical, chart, labs and discussed the procedure including the risks, benefits and alternatives for the proposed anesthesia with the patient or authorized representative who has indicated his/her understanding and acceptance.   Dental advisory given  Plan Discussed with: CRNA and Surgeon  Anesthesia Plan Comments:         Anesthesia Quick Evaluation

## 2013-11-07 NOTE — Transfer of Care (Signed)
Immediate Anesthesia Transfer of Care Note  Patient: Paul Acosta  Procedure(s) Performed: Procedure(s): RIGHT TOTAL HIP ARTHROPLASTY (Right)  Patient Location: PACU  Anesthesia Type:Spinal  Level of Consciousness: awake, alert  and oriented  Airway & Oxygen Therapy: Patient Spontanous Breathing  Post-op Assessment: Report given to PACU RN and Post -op Vital signs reviewed and stable  Post vital signs: Reviewed and stable  Complications: No apparent anesthesia complications

## 2013-11-07 NOTE — Anesthesia Procedure Notes (Signed)
Spinal  Patient location during procedure: pre-op Start time: 11/07/2013 12:34 PM End time: 11/07/2013 12:39 PM Staffing Anesthesiologist: Rithika Seel, CHRIS Preanesthetic Checklist Completed: patient identified, surgical consent, pre-op evaluation, timeout performed, IV checked, risks and benefits discussed and monitors and equipment checked Spinal Block Patient position: sitting Prep: site prepped and draped and DuraPrep Patient monitoring: heart rate, cardiac monitor, continuous pulse ox and blood pressure Approach: midline Location: L3-4 Injection technique: single-shot Needle Needle type: Pencan  Needle gauge: 24 G Needle length: 10 cm Assessment Sensory level: T8

## 2013-11-07 NOTE — Anesthesia Postprocedure Evaluation (Signed)
  Anesthesia Post-op Note  Patient: Paul Acosta  Procedure(s) Performed: Procedure(s): RIGHT TOTAL HIP ARTHROPLASTY (Right)  Patient Location: PACU  Anesthesia Type:MAC and Spinal  Level of Consciousness: awake and alert   Airway and Oxygen Therapy: Patient Spontanous Breathing  Post-op Pain: none  Post-op Assessment: Post-op Vital signs reviewed, Patient's Cardiovascular Status Stable, Respiratory Function Stable, Patent Airway, No signs of Nausea or vomiting and Pain level controlled  Post-op Vital Signs: Reviewed and stable  Last Vitals:  Filed Vitals:   11/07/13 1652  BP: 146/74  Pulse: 64  Temp: 36.4 C  Resp: 16    Complications: No apparent anesthesia complications

## 2013-11-07 NOTE — Interval H&P Note (Signed)
History and Physical Interval Note:  11/07/2013 11:52 AM  Paul Acosta  has presented today for surgery, with the diagnosis of right hip arthritis  The various methods of treatment have been discussed with the patient and family. After consideration of risks, benefits and other options for treatment, the patient has consented to  Procedure(s): RIGHT TOTAL HIP ARTHROPLASTY (Right) as a surgical intervention .  The patient's history has been reviewed, patient examined, no change in status, stable for surgery.  I have reviewed the patient's chart and labs.  Questions were answered to the patient's satisfaction.     Kerin Salen

## 2013-11-08 LAB — BASIC METABOLIC PANEL
Anion gap: 9 (ref 5–15)
BUN: 12 mg/dL (ref 6–23)
CHLORIDE: 102 meq/L (ref 96–112)
CO2: 26 mEq/L (ref 19–32)
Calcium: 8.2 mg/dL — ABNORMAL LOW (ref 8.4–10.5)
Creatinine, Ser: 0.86 mg/dL (ref 0.50–1.35)
GFR calc Af Amer: >90 mL/min (ref >90)
GFR calc non Af Amer: >90 mL/min (ref >90)
Glucose, Bld: 123 mg/dL — ABNORMAL HIGH (ref 70–99)
POTASSIUM: 3.9 meq/L (ref 3.7–5.3)
Sodium: 137 mEq/L (ref 137–147)

## 2013-11-08 LAB — CBC
HEMATOCRIT: 36.4 % — AB (ref 39.0–52.0)
Hemoglobin: 12.5 g/dL — ABNORMAL LOW (ref 13.0–17.0)
MCH: 30.1 pg (ref 26.0–34.0)
MCHC: 34.3 g/dL (ref 30.0–36.0)
MCV: 87.7 fL (ref 78.0–100.0)
Platelets: 164 10*3/uL (ref 150–400)
RBC: 4.15 MIL/uL — ABNORMAL LOW (ref 4.22–5.81)
RDW: 13.3 % (ref 11.5–15.5)
WBC: 6.5 10*3/uL (ref 4.0–10.5)

## 2013-11-08 MED ORDER — INFLUENZA VAC SPLIT QUAD 0.5 ML IM SUSY
0.5000 mL | PREFILLED_SYRINGE | INTRAMUSCULAR | Status: AC
  Administered 2013-11-09: 0.5 mL via INTRAMUSCULAR
  Filled 2013-11-08: qty 0.5

## 2013-11-08 MED ORDER — HYDROMORPHONE HCL 2 MG PO TABS
2.0000 mg | ORAL_TABLET | ORAL | Status: DC | PRN
  Administered 2013-11-08 (×2): 2 mg via ORAL
  Filled 2013-11-08: qty 1
  Filled 2013-11-08: qty 2

## 2013-11-08 NOTE — Evaluation (Signed)
Occupational Therapy Evaluation Patient Details Name: Paul Acosta MRN: [id number] DOB: 11/24/1950 Today's Date: 11/08/2013    History of Present Illness pt is a very active 63 yo male s/p Rt THA (posterior).   Clinical Impression   Pt s/p above. Pt independent with ADLs, PTA. Feel pt will benefit from acute OT to increase independence prior to d/c. Plan to practice shower transfer and LB ADLs with AE.    Follow Up Recommendations  No OT follow up;Supervision - Intermittent    Equipment Recommendations  3 in 1 bedside comode;Other (comment) (AE)    Recommendations for Other Services       Precautions / Restrictions Precautions Precautions: Posterior Hip Precaution Comments: educated on precautions Restrictions Weight Bearing Restrictions: Yes RLE Weight Bearing: Weight bearing as tolerated      Mobility Bed Mobility               General bed mobility comments: not assessed  Transfers Overall transfer level: Needs assistance Equipment used: Rolling walker (2 wheeled) Transfers: Sit to/from Stand Sit to Stand: Min guard         General transfer comment: cues for technique and to control descent to chair    Balance                                            ADL Overall ADL's : Needs assistance/impaired     Grooming: Standing;Wash/dry face;Oral care;Min guard               Lower Body Dressing: Sit to/from stand;Moderate assistance   Toilet Transfer: Min guard;Minimal assistance;RW;Ambulation (chair)           Functional mobility during ADLs: Min guard;Minimal assistance;Rolling walker General ADL Comments: Educated on dressing technique. Educated on shower transfer technique. Discussed 3 in 1. Educated on AE and pt practiced. Educated on safety such as- rugs, use of bag on walker, pet.     Vision                     Perception     Praxis      Pertinent Vitals/Pain Pain Assessment: 0-10 Pain Score: 7   Pain Location: right hip Pain Descriptors / Indicators: Aching Pain Intervention(s): Repositioned;Monitored during session     Hand Dominance Left   Extremity/Trunk Assessment Upper Extremity Assessment Upper Extremity Assessment: Overall WFL for tasks assessed   Lower Extremity Assessment Lower Extremity Assessment: Defer to PT evaluation       Communication Communication Communication: No difficulties   Cognition Arousal/Alertness: Awake/alert Behavior During Therapy: WFL for tasks assessed/performed Overall Cognitive Status: Within Functional Limits for tasks assessed                     General Comments       Exercises       Shoulder Instructions      Home Living Family/patient expects to be discharged to:: Private residence Living Arrangements: Spouse/significant other;Children Available Help at Discharge: Family;Friend(s);Available 24 hours/day Type of Home: House Home Access: Stairs to enter [employer] of Steps: 2 Entrance Stairs-Rails: None Home Layout: One level     Bathroom Shower/Tub: Tub/shower unit;Walk-in shower   Bathroom Toilet: Standard     Home Equipment: None          Prior Functioning/Environment Level of Independence: Independent  OT Diagnosis: Acute pain   OT Problem List: Decreased strength;Decreased range of motion;Decreased activity tolerance;Decreased knowledge of use of DME or AE;Decreased knowledge of precautions;Pain   OT Treatment/Interventions: Self-care/ADL training;DME and/or AE instruction;Therapeutic activities;Patient/family education;Balance training    OT Goals(Current goals can be found in the care plan section) Acute Rehab OT Goals Patient Stated Goal: not stated OT Goal Formulation: With patient Time For Goal Achievement: 11/15/13 Potential to Achieve Goals: Good ADL Goals Pt Will Perform Lower Body Dressing: with modified independence;sit to/from stand;with adaptive  equipment Pt Will Transfer to Toilet: with modified independence;ambulating (3 in 1 over commode) Pt Will Perform Toileting - Clothing Manipulation and hygiene: with modified independence;sit to/from stand Pt Will Perform Tub/Shower Transfer: Shower transfer;with supervision;ambulating;rolling walker;3 in 1  OT Frequency: Min 2X/week   Barriers to D/C:            Co-evaluation              End of Session Equipment Utilized During Treatment: Gait belt;Rolling walker  Activity Tolerance: Patient limited by pain Patient left: in chair;with call bell/phone within reach;with family/visitor present   Time: 0370-4888 OT Time Calculation (min): 29 min Charges:  OT General Charges $OT Visit: 1 Procedure OT Evaluation $Initial OT Evaluation Tier I: 1 Procedure OT Treatments $Self Care/Home Management : 8-22 mins G-CodesBenito Mccreedy OTR/L [phone] 11/08/2013, 12:53 PM

## 2013-11-08 NOTE — Progress Notes (Signed)
Patient ID: Paul Acosta, male   DOB: 01/10/1951, 63 y.o.   MRN: [id number] PATIENT ID: Paul Acosta  MRN: [id number]  DOB/AGE:  Oct 08, 1950 / 63 y.o.  1 Day Post-Op Procedure(s) (LRB): RIGHT TOTAL HIP ARTHROPLASTY (Right)    PROGRESS NOTE Subjective: Patient is alert, oriented, 1+ Nausea, no Vomiting, yes passing gas, no Bowel Movement. Taking PO well. Denies SOB, Chest or Calf Pain. Using Incentive Spirometer, PAS in place. Ambulate WBAT Patient reports pain as 4 on 0-10 scale  .    Objective: Vital signs in last 24 hours: Filed Vitals:   11/07/13 1652 11/07/13 2206 11/08/13 0122 11/08/13 0518  BP: 146/74 133/70 135/67 132/71  Pulse: 64 68 75 72  Temp: 97.5 F (36.4 C) 98.5 F (36.9 C) 97.6 F (36.4 C) 98.7 F (37.1 C)  TempSrc: Oral     Resp: 16 16 16 16   Height:      Weight:      SpO2: 100% 96% 95% 96%      Intake/Output from previous day: I/O last 3 completed shifts: In: 2500 [I.V.:2500] Out: 3350 [Urine:3150; Blood:200]   Intake/Output this shift:     LABORATORY DATA:  Recent Labs  11/07/13 1123 11/08/13 0449  WBC 4.1 6.5  HGB 15.1 12.5*  HCT 42.7 36.4*  PLT 200 164  NA 140 137  K 4.9 3.9  CL 102 102  CO2 25 26  BUN 15 12  CREATININE 0.91 0.86  GLUCOSE 90 123*  INR 0.96  --   CALCIUM 9.1 8.2*    Examination: Neurologically intact ABD soft Neurovascular intact Sensation intact distally Intact pulses distally Dorsiflexion/Plantar flexion intact Incision: dressing C/D/I No cellulitis present Compartment soft} XR AP&Lat of hip shows well placed\fixed THA  Assessment:   1 Day Post-Op Procedure(s) (LRB): RIGHT TOTAL HIP ARTHROPLASTY (Right) ADDITIONAL DIAGNOSIS:  Expected Acute Blood Loss Anemia,   Plan: PT/OT WBAT, THA  posterior precautions  DVT Prophylaxis: SCDx72 hrs, ASA 325 mg BID x 2 weeks, D/C oxy codone switch to dilaudid PO  DISCHARGE PLAN: Home, when passes PT  DISCHARGE NEEDS: HHPT, HHRN, CPM, Walker and 3-in-1 comode  seat

## 2013-11-08 NOTE — Evaluation (Signed)
Physical Therapy Evaluation Patient Details Name: Paul Acosta MRN: [id number] DOB: 1950/02/09 Today's Date: 11/08/2013   History of Present Illness  pt is a very active 63 yo male s/p Rt THA (posterior).  Clinical Impression  Pt is s/p Rt THA POD#1 resulting in the deficits listed below (see PT Problem List). Pt will benefit from skilled PT to increase their independence and safety with mobility to allow discharge to the venue listed below. Pt limited in mobility today due to pain and nausea. Pt would benefit from additional day for pain control and additional therapies. Pt not safe from mobility standpoint to D/C home today.      Follow Up Recommendations Home health PT;Supervision/Assistance - 24 hour    Equipment Recommendations  Rolling walker with 5" wheels;3in1 (PT)    Recommendations for Other Services OT consult     Precautions / Restrictions Precautions Precautions: Posterior Hip Precaution Comments: pt educated on precautions throughout session Restrictions Weight Bearing Restrictions: No RLE Weight Bearing: Weight bearing as tolerated      Mobility  Bed Mobility Overal bed mobility: Needs Assistance Bed Mobility: Supine to Sit     Supine to sit: Min assist;HOB elevated     General bed mobility comments: (A) to advance Rt LE off EOB; effortful for pt to mobilize to EOB; incr time due to pain   Transfers Overall transfer level: Needs assistance Equipment used: Rolling walker (2 wheeled) Transfers: Sit to/from Stand Sit to Stand: Min assist         General transfer comment: (A) to power up and balance; pt limited by pain ; cues for sequencin   Ambulation/Gait Ambulation/Gait assistance: Min guard Ambulation Distance (Feet): 40 Feet Assistive device: Rolling walker (2 wheeled) Gait Pattern/deviations: Step-to pattern;Decreased step length - left;Decreased stance time - right;Antalgic;Trunk flexed Gait velocity: decr due to pain  Gait velocity  interpretation: Below normal speed for age/gender General Gait Details: cues for gt sequencing; pt limited due to c/o nausea ; cues to not IR with directional changes   Stairs            Wheelchair Mobility    Modified Rankin (Stroke Patients Only)       Balance Overall balance assessment: Needs assistance Sitting-balance support: Feet supported Sitting balance-Leahy Scale: Fair Sitting balance - Comments: pt guarded sitting EOB due to pain; no LOB noted ; sat EOB for 10 min; no c/o dizziness    Standing balance support: During functional activity;Bilateral upper extremity supported Standing balance-Leahy Scale: Poor Standing balance comment: relying on RW for balance                              Pertinent Vitals/Pain Pain Assessment: 0-10 Pain Score: 10-Worst pain ever Pain Location: Rt hip Pain Descriptors / Indicators: Aching;Sore Pain Intervention(s): Limited activity within patient's tolerance;Monitored during session;Premedicated before session;Repositioned    Home Living Family/patient expects to be discharged to:: Private residence Living Arrangements: Spouse/significant other;Children Available Help at Discharge: Family;Friend(s);Available 24 hours/day Type of Home: House Home Access: Stairs to enter Entrance Stairs-Rails: None Entrance Stairs-Number of Steps: 2 Home Layout: One level Home Equipment: None Additional Comments: pt has walk in shower    Prior Function Level of Independence: Independent               Hand Dominance   Dominant Hand: Left    Extremity/Trunk Assessment   Upper Extremity Assessment: Defer to OT evaluation  Lower Extremity Assessment: RLE deficits/detail RLE Deficits / Details: quad 3+/5; hip 2+/5    Cervical / Trunk Assessment: Normal  Communication   Communication: No difficulties  Cognition Arousal/Alertness: Awake/alert Behavior During Therapy: WFL for tasks  assessed/performed Overall Cognitive Status: Within Functional Limits for tasks assessed                      General Comments      Exercises Total Joint Exercises Ankle Circles/Pumps: AROM;Both;15 reps;Supine Quad Sets: AROM;Right;10 reps;Seated Heel Slides: AAROM;Right;5 reps;Other (comment) (limited by pain ) Long Arc Quad: AROM;Strengthening;Right;10 reps;Seated      Assessment/Plan    PT Assessment Patient needs continued PT services  PT Diagnosis Difficulty walking;Generalized weakness;Acute pain   PT Problem List Decreased strength;Decreased range of motion;Decreased activity tolerance;Decreased balance;Decreased mobility;Decreased knowledge of use of DME;Decreased knowledge of precautions;Pain  PT Treatment Interventions DME instruction;Gait training;Stair training;Therapeutic activities;Therapeutic exercise;Functional mobility training;Balance training;Neuromuscular re-education;Patient/family education   PT Goals (Current goals can be found in the Care Plan section) Acute Rehab PT Goals Patient Stated Goal: to go home tomorrow when im ready PT Goal Formulation: With patient Time For Goal Achievement: 11/10/13 Potential to Achieve Goals: Good    Frequency 7X/week   Barriers to discharge        Co-evaluation               End of Session Equipment Utilized During Treatment: Gait belt Activity Tolerance: Patient limited by pain;Other (comment) (c/o nausea ) Patient left: in chair;with call bell/phone within reach;with family/visitor present Nurse Communication: Mobility status;Weight bearing status;Precautions         Time: 0832-0908 PT Time Calculation (min): 36 min   Charges:   PT Evaluation $Initial PT Evaluation Tier I: 1 Procedure PT Treatments $Gait Training: 8-22 mins $Therapeutic Exercise: 8-22 mins   PT G CodesGustavus Bryant , [place]  11/08/2013, 11:09 AM

## 2013-11-08 NOTE — Care Management Note (Addendum)
CARE MANAGEMENT NOTE 11/08/2013  Patient:  Paul Acosta, Paul Acosta   Account Number:  [id number]  Date Initiated:  11/08/2013  Documentation initiated by:  Ricki Miller  Subjective/Objective Assessment:   63 yr old male admitted with severe arthritis of the right hip. Patient underwent a right hip arthroplasty.     Action/Plan:   Case manager spoke with patient concerning home health and DME needs. Patient was preoperatively setup with [facility], no changes. He has family support at discharge.   Anticipated DC Date:  11/09/2013   Anticipated DC Plan:  [place]  In-house referral  NA      DC Planning Services  CM consult      PAC Choice  [place]   Choice offered to / List presented to:  C-1 Patient   DME arranged  [place]  3-N-1      DME agency  [place] arranged  HH-2 PT      [facility] agency  [place]   Status of service:  Completed, signed off Medicare Important Message given?   (If response is "NO", the following Medicare IM given date fields will be blank) Date Medicare IM given:   Medicare IM given by:   Date Additional Medicare IM given:   Additional Medicare IM given by:    Discharge Disposition:  [place]  Per UR Regulation:  Reviewed for med. necessity/level of care/duration of stay  If discussed at [place] of Stay Meetings, dates discussed:    Comments:  11/08/13  2:16pm Case manager faxed orders, demographics and pertinent information to Harriet Pho @ [facility] [phone]).

## 2013-11-09 LAB — CBC
HCT: 36 % — ABNORMAL LOW (ref 39.0–52.0)
Hemoglobin: 12.5 g/dL — ABNORMAL LOW (ref 13.0–17.0)
MCH: 29.7 pg (ref 26.0–34.0)
MCHC: 34.7 g/dL (ref 30.0–36.0)
MCV: 85.5 fL (ref 78.0–100.0)
PLATELETS: 160 10*3/uL (ref 150–400)
RBC: 4.21 MIL/uL — AB (ref 4.22–5.81)
RDW: 13.3 % (ref 11.5–15.5)
WBC: 6.1 10*3/uL (ref 4.0–10.5)

## 2013-11-09 MED ORDER — HYDROMORPHONE HCL 2 MG PO TABS: 2.0000 mg | ORAL_TABLET | Freq: Four times a day (QID) | ORAL | Status: DC | PRN

## 2013-11-09 NOTE — Care Management Note (Signed)
    Page 1 of 2   11/09/2013     2:52:44 PM CARE MANAGEMENT NOTE 11/09/2013  Patient:  Paul Acosta, Paul Acosta   Account Number:  [id number]  Date Initiated:  11/08/2013  Documentation initiated by:  Ricki Miller  Subjective/Objective Assessment:   63 yr old male admitted with severe arthritis of the right hip. Patient underwent a right hip arthroplasty.     Action/Plan:   Case manager spoke with patient concerning home health and DME needs. Patient was preoperatively setup with [facility], no changes. He has family support at discharge.   Anticipated DC Date:  11/09/2013   Anticipated DC Plan:  [place]  In-house referral  NA      DC Planning Services  CM consult      PAC Choice  [place]   Choice offered to / List presented to:  C-1 Patient   DME arranged  [place]  3-N-1      DME agency  [place] arranged  HH-2 PT      [facility] agency  [place]   Status of service:  Completed, signed off Medicare Important Message given?   (If response is "NO", the following Medicare IM given date fields will be blank) Date Medicare IM given:   Medicare IM given by:   Date Additional Medicare IM given:   Additional Medicare IM given by:    Discharge Disposition:  [place]  Per UR Regulation:  Reviewed for med. necessity/level of care/duration of stay  If discussed at [place] of Stay Meetings, dates discussed:    Comments:  11/09/13 14:00 CM called DME rep, james to please deliver 3n1 and rolling walker to room prior to discharge.  Mariane Masters, BSN, [place] [phone]. 11/08/13 2:16p Ricki Miller, RN BSN Case Manager CM faxed orders, demographics and pertinent information to Harriet Pho at [facility] [phone]).

## 2013-11-09 NOTE — Progress Notes (Signed)
PATIENT ID: Paul Acosta  MRN: [id number]  DOB/AGE:  62/17/1952 / 63 y.o.  2 Days Post-Op Procedure(s) (LRB): RIGHT TOTAL HIP ARTHROPLASTY (Right)    PROGRESS NOTE Subjective: Patient is alert, oriented, no Nausea, no Vomiting, yes passing gas, no Bowel Movement. Taking PO well. Denies SOB, Chest or Calf Pain. Using Incentive Spirometer, PAS in place. Ambulate WBAT Patient reports pain as mild  .    Objective: Vital signs in last 24 hours: Filed Vitals:   11/08/13 2008 11/09/13 0000 11/09/13 0400 11/09/13 0552  BP: 156/87   137/77  Pulse: 77   81  Temp: 98.1 F (36.7 C)   98.6 F (37 C)  TempSrc:    Oral  Resp: 16 16 16 16   Height:      Weight:      SpO2: 98% 98% 98% 92%      Intake/Output from previous day: I/O last 3 completed shifts: In: -  Out: 2950 [Urine:2950]   Intake/Output this shift:     LABORATORY DATA:  Recent Labs  11/07/13 1123 11/08/13 0449 11/09/13 0400  WBC 4.1 6.5 6.1  HGB 15.1 12.5* 12.5*  HCT 42.7 36.4* 36.0*  PLT 200 164 160  NA 140 137  --   K 4.9 3.9  --   CL 102 102  --   CO2 25 26  --   BUN 15 12  --   CREATININE 0.91 0.86  --   GLUCOSE 90 123*  --   INR 0.96  --   --   CALCIUM 9.1 8.2*  --     Examination: Neurologically intact Neurovascular intact Sensation intact distally Intact pulses distally Dorsiflexion/Plantar flexion intact Incision: moderate drainage No cellulitis present Compartment soft} XR AP&Lat of hip shows well placed\fixed THA  Assessment:   2 Days Post-Op Procedure(s) (LRB): RIGHT TOTAL HIP ARTHROPLASTY (Right) ADDITIONAL DIAGNOSIS:  Expected Acute Blood Loss Anemia,   Plan: PT/OT WBAT, THA  posterior precautions  DVT Prophylaxis: SCDx72 hrs, ASA 325 mg BID x 2 weeks  DISCHARGE PLAN: Home today after therapy  DISCHARGE NEEDS: HHPT, HHRN, Walker and 3-in-1 comode seat

## 2013-11-09 NOTE — Discharge Summary (Signed)
Patient ID: Paul Acosta MRN: [id number] DOB/AGE: [birth year] 63 y.o.  Admit date: 11/07/2013 Discharge date: 11/09/2013  Admission Diagnoses:  Active Problems:   Arthritis, hip   Discharge Diagnoses:  Same  Past Medical History  Diagnosis Date  . Medical history non-contributory     Surgeries: Procedure(s): RIGHT TOTAL HIP ARTHROPLASTY on 11/07/2013   Consultants:    Discharged Condition: Improved  Hospital Course: ALICIA ACKERT is an 63 y.o. male who was admitted 11/07/2013 for operative treatment of<principal problem not specified>. Patient has severe unremitting pain that affects sleep, daily activities, and work/hobbies. After pre-op clearance the patient was taken to the operating room on 11/07/2013 and underwent  Procedure(s): RIGHT TOTAL HIP ARTHROPLASTY.    Patient was given perioperative antibiotics: Anti-infectives   Start     Dose/Rate Route Frequency Ordered Stop   11/07/13 1056  ceFAZolin (ANCEF) IVPB 2 g/50 mL premix  Status:  Discontinued     2 g 100 mL/hr over 30 Minutes Intravenous On call to O.R. 11/07/13 1056 11/07/13 1643   11/07/13 0600  ceFAZolin (ANCEF) 3 g in dextrose 5 % 50 mL IVPB     3 g 160 mL/hr over 30 Minutes Intravenous On call to O.R. 11/06/13 1433 11/07/13 1254       Patient was given sequential compression devices, early ambulation, and chemoprophylaxis to prevent DVT.  Patient benefited maximally from hospital stay and there were no complications.    Recent vital signs: Patient Vitals for the past 24 hrs:  BP Temp Temp src Pulse Resp SpO2  11/09/13 0552 137/77 mmHg 98.6 F (37 C) Oral 81 16 92 %  11/09/13 0400 - - - - 16 98 %  11/09/13 0000 - - - - 16 98 %  11/08/13 2008 156/87 mmHg 98.1 F (36.7 C) - 77 16 98 %  11/08/13 2000 - - - - 18 98 %  11/08/13 1517 138/76 mmHg 98.1 F (36.7 C) Oral 78 16 100 %     Recent laboratory studies:  Recent Labs  11/07/13 1123 11/08/13 0449 11/09/13 0400  WBC 4.1 6.5 6.1  HGB 15.1  12.5* 12.5*  HCT 42.7 36.4* 36.0*  PLT 200 164 160  NA 140 137  --   K 4.9 3.9  --   CL 102 102  --   CO2 25 26  --   BUN 15 12  --   CREATININE 0.91 0.86  --   GLUCOSE 90 123*  --   INR 0.96  --   --   CALCIUM 9.1 8.2*  --      Discharge Medications:     Medication List    STOP taking these medications       meloxicam 15 MG tablet  Commonly known as:  MOBIC      TAKE these medications       aspirin EC 325 MG tablet  Take 1 tablet (325 mg total) by mouth 2 (two) times daily.     HYDROmorphone 2 MG tablet  Commonly known as:  DILAUDID  Take 1 tablet (2 mg total) by mouth every 6 (six) hours as needed for severe pain.     methocarbamol 500 MG tablet  Commonly known as:  ROBAXIN  Take 1 tablet (500 mg total) by mouth 2 (two) times daily with a meal.        Diagnostic Studies: Dg Chest 2 View  10/30/2013   CLINICAL DATA:  Preop right total hip arthroplasty  EXAM: CHEST  2 VIEW  COMPARISON:  None.  FINDINGS: The heart size and mediastinal contours are within normal limits. Both lungs are clear. The visualized skeletal structures are unremarkable.  IMPRESSION: No active cardiopulmonary disease.   Electronically Signed   By: Kathreen Devoid   On: 10/30/2013 08:56   Dg Pelvis Portable  11/07/2013   CLINICAL DATA:  Status post right total hip arthroplasty  EXAM: PORTABLE PELVIS 1-2 VIEWS  COMPARISON:  Preoperative study of September 03, 2013  FINDINGS: The patient has undergone placement of right total hip arthroplasty. The visualized portions of the interface with the native bone are normal.  IMPRESSION: There is no immediate postprocedure complication following placement of the right hip prosthesis.   Electronically Signed   By: David  Martinique   On: 11/07/2013 15:34    Disposition: Final discharge disposition not confirmed      Discharge Instructions   Call MD / Call 911    Complete by:  As directed   If you experience chest pain or shortness of breath, CALL 911 and be  transported to the hospital emergency room.  If you develope a fever above 101 F, pus (white drainage) or increased drainage or redness at the wound, or calf pain, call your surgeon's office.     Change dressing    Complete by:  As directed   You may change your dressing on day 5, then change the dressing daily with sterile 4 x 4 inch gauze dressing and paper tape.  You may clean the incision with alcohol prior to redressing     Constipation Prevention    Complete by:  As directed   Drink plenty of fluids.  Prune juice may be helpful.  You may use a stool softener, such as Colace (over the counter) 100 mg twice a day.  Use MiraLax (over the counter) for constipation as needed.     Diet - low sodium heart healthy    Complete by:  As directed      Discharge instructions    Complete by:  As directed   Follow up in office with Dr. Mayer Camel in 2 weeks.     Driving restrictions    Complete by:  As directed   No driving for 2 weeks     Follow the hip precautions as taught in Physical Therapy    Complete by:  As directed      Increase activity slowly as tolerated    Complete by:  As directed      Patient may shower    Complete by:  As directed   You may shower without a dressing once there is no drainage.  Do not wash over the wound.  If drainage remains, cover wound with plastic wrap and then shower.           Follow-up Information   Follow up with Kerin Salen, MD In 2 weeks.   Specialty:  Orthopedic Surgery   Contact information:   [place] [address] [phone]       Follow up with [place]. (Someone from [facility] will contact you concerning start date and time for therapy.)    Specialty:  Home Health Services   Contact information:   [address] [place] [address] [phone]        Signed: Theodosia Quay 11/09/2013, 8:33 AM

## 2013-11-09 NOTE — Progress Notes (Addendum)
OT Cancellation Note  Patient Details Name: Paul Acosta MRN: [id number] DOB: 05-25-1950   Cancelled Treatment:     Spoke with pt for approximately 2 minutes (asked about sleeping position) and states he feels good about shower transfer and AE. Signing off.  Benito Mccreedy OTR/L [phone] 11/09/2013, 11:25 AM

## 2013-11-09 NOTE — Progress Notes (Signed)
Physical Therapy Treatment Patient Details Name: Paul Acosta MRN: [id number] DOB: 02-06-1950 Today's Date: 11/09/2013    History of Present Illness pt is a very active 63 yo male s/p Rt THA (posterior).    PT Comments    Patient did well with mobility today.  Progressing nicely with mobility and independence.  Feel patient is ready for d/c home from PT standpoint with HHPT to progress gait and independence.  Goals essentially met.    Follow Up Recommendations  Home health PT     Equipment Recommendations  Rolling walker with 5" wheels    Recommendations for Other Services       Precautions / Restrictions Precautions Precautions: Posterior Hip Precaution Comments: reviewed precautions - able to recall 3/3 Restrictions RLE Weight Bearing: Weight bearing as tolerated    Mobility  Bed Mobility Overal bed mobility: Needs Assistance Bed Mobility: Supine to Sit     Supine to sit: Min assist     General bed mobility comments: HOB flat, assist for right LE  Transfers Overall transfer level: Modified independent Equipment used: Rolling walker (2 wheeled) Transfers: Sit to/from Stand Sit to Stand: Modified independent (Device/Increase time)         General transfer comment: cueing to control descent to chair  Ambulation/Gait Ambulation/Gait assistance: Modified independent (Device/Increase time) Ambulation Distance (Feet): 100 Feet Assistive device: Rolling walker (2 wheeled) Gait Pattern/deviations: Step-to pattern;Trunk flexed;Antalgic;Decreased step length - left;Decreased stance time - right   Gait velocity interpretation: Below normal speed for age/gender     Stairs Stairs: Yes Stairs assistance: Min guard Stair Management: One rail Left;Forwards;With walker Number of Stairs: 2 General stair comments: min guard for safety  Wheelchair Mobility    Modified Rankin (Stroke Patients Only)       Balance                                     Cognition Arousal/Alertness: Awake/alert Behavior During Therapy: WFL for tasks assessed/performed Overall Cognitive Status: Within Functional Limits for tasks assessed                      Exercises Total Joint Exercises Ankle Circles/Pumps: AROM;Both;15 reps;Seated Quad Sets: AROM;Right;10 reps;Seated Long Arc Quad: AROM;Right;10 reps;Seated    General Comments        Pertinent Vitals/Pain Pain Score: 3  Pain Location: right hip Pain Descriptors / Indicators: Discomfort Pain Intervention(s): Monitored during session    Home Living                      Prior Function            PT Goals (current goals can now be found in the care plan section) Progress towards PT goals: Progressing toward goals    Frequency  7X/week    PT Plan Current plan remains appropriate    Co-evaluation             End of Session Equipment Utilized During Treatment: Gait belt Activity Tolerance: Patient tolerated treatment well Patient left: in chair;with call bell/phone within reach;with family/visitor present     Time: 9292-4462 PT Time Calculation (min): 20 min  Charges:  $Gait Training: 8-22 mins                    G CodesShanna Cisco, [place] 11/09/2013, 9:42 AM

## 2013-11-10 NOTE — Progress Notes (Signed)
Utilization review completed.  

## 2013-11-11 ENCOUNTER — Encounter (HOSPITAL_COMMUNITY): Payer: Self-pay | Admitting: Orthopedic Surgery

## 2014-06-10 ENCOUNTER — Other Ambulatory Visit: Payer: Self-pay | Admitting: Family Medicine

## 2014-06-10 DIAGNOSIS — G459 Transient cerebral ischemic attack, unspecified: Secondary | ICD-10-CM

## 2014-06-10 DIAGNOSIS — R42 Dizziness and giddiness: Secondary | ICD-10-CM

## 2014-06-10 DIAGNOSIS — R29898 Other symptoms and signs involving the musculoskeletal system: Secondary | ICD-10-CM

## 2014-06-18 ENCOUNTER — Other Ambulatory Visit: Payer: Self-pay

## 2015-01-28 DIAGNOSIS — C4491 Basal cell carcinoma of skin, unspecified: Secondary | ICD-10-CM

## 2015-01-28 HISTORY — DX: Basal cell carcinoma of skin, unspecified: C44.91

## 2016-06-13 ENCOUNTER — Ambulatory Visit: Payer: Self-pay

## 2016-06-13 ENCOUNTER — Encounter: Payer: Self-pay | Admitting: Sports Medicine

## 2016-06-13 ENCOUNTER — Ambulatory Visit (INDEPENDENT_AMBULATORY_CARE_PROVIDER_SITE_OTHER): Payer: BLUE CROSS/BLUE SHIELD | Admitting: Sports Medicine

## 2016-06-13 VITALS — BP 138/84 | Ht 79.0 in | Wt 242.0 lb

## 2016-06-13 DIAGNOSIS — M25552 Pain in left hip: Secondary | ICD-10-CM

## 2016-06-13 DIAGNOSIS — M25571 Pain in right ankle and joints of right foot: Secondary | ICD-10-CM

## 2016-06-13 DIAGNOSIS — M2170 Unequal limb length (acquired), unspecified site: Secondary | ICD-10-CM

## 2016-06-13 DIAGNOSIS — M67952 Unspecified disorder of synovium and tendon, left thigh: Secondary | ICD-10-CM | POA: Diagnosis not present

## 2016-06-13 DIAGNOSIS — G8929 Other chronic pain: Secondary | ICD-10-CM

## 2016-06-13 NOTE — Assessment & Plan Note (Signed)
Subluxation of RT subtalar joint with mid foot collapse and loss of longitudinal arch  Needs orthotic support

## 2016-06-13 NOTE — Progress Notes (Signed)
Subjective:    Patient ID: Paul Acosta, male    DOB: [birth year], 66 y.o.   MRN: [id number]  HPI Paul Acosta is a 66 year old male who presents today with complaints of left hip pain and swelling. He was previously seen by Dr. Linnell Fulling in 2015 for right hip pain. X-rays at that time demonstrated right hip OA. He subsequently underwent right hip CST injection under US guidance with relief for only 3-4 days. Underwent right total hip arthroplasty 10/2013.   Today, he reports that back in August 2017 he was doing squats and noticed acute pain in his left hip that radiated down his lateral thigh to his knee. Pain is not similar to his previous OA pain. He reports he ignored the pain and continued his normal regimen of stationary bike x 1 hour daily and weight lifting exercises x 3 days/week. The pain persisted and he was unable to play golf due to the pain. He followed up with his orthopedic surgeon (DR Mayer Camel) who checked an X-ray (not in our system) and told him the joint space looked good with no evidence of OA. He stopped the stationary bike & squats and started running on the treadmill with some benefit but still was having pain and started using the elliptical. Since switching to the elliptical he has not had any more pain for the past 2-3 months. He has however noticed swelling in his left hip for the past 2 months. Reports having repeat XR at orthopedics which were negative for any bony pathology. His trainer [facility]) recommended following up here for ultrasound.   Review of Systems No fevers, chills, malaise, body aches, erythema or warmth    Objective:   Physical Exam  Constitutional: He is oriented to person, place, and time. He appears well-developed and well-nourished.  Cardiovascular: Intact distal pulses.   Musculoskeletal:  General inspection reveals no abnormalities. Leg lengths with 2 cm difference (105 cm right vs 103 cm left). No obvious swelling over left hip. Full range  of motion without pain. Internal and external range of motion of left hip intact. No tenderness to palpation over left greater trochanter. Left hip abduction strength 3/5 on left, 5/5 on right. +Left Trendelenburg gait.   Right ankle with severe fixed valgus deformity.   Neurological: He is alert and oriented to person, place, and time.  Skin: Skin is warm and dry.  Psychiatric: He has a normal mood and affect. Judgment normal.  Vitals reviewed.  Ultrasound Left lateral hip:  POC [place] was performed. Demonstrated chronic edema with hypoechoic change on the superior facet of the greater left trochanter.  This is at insertion of and junction of the gluteus medius and gluteus minimus.  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 Fields, MD     Assessment & Plan:   Left Hip Pain and Swelling: Greater trochanteric pain syndrome secondary to gait abnormality.  -Provided temporary left shoe insert; reported immediate improvement in symptoms. Will trial for 6 weeks and return for custom orthotics. Will need bilateral orthotics and correct height difference.  -Provided abduction strengthening exercises for left hip; limit deep stretching of IT band (squats, bike) as compression of his greater trochanteric bursa with his chronic edema is likely contributing to his pain.  -If develops recurrent pain, can consider greater trochanteric bursa injection -Return to clinic  in 6 weeks for follow up  I observed and examined the patient with the resident and agree with assessment and plan.  Note reviewed and modified by me. Stefanie Libel, MD

## 2016-06-13 NOTE — Assessment & Plan Note (Signed)
We added a lift today and this reduced the trendelenburg gait  Felt much better  Prob. Needs an orthotic

## 2016-06-13 NOTE — Assessment & Plan Note (Signed)
See HEP  PRN NSAIDS  Modify exercises with hip/knee flexion  Reck 2 mos

## 2016-07-27 ENCOUNTER — Ambulatory Visit (INDEPENDENT_AMBULATORY_CARE_PROVIDER_SITE_OTHER): Payer: BLUE CROSS/BLUE SHIELD | Admitting: Sports Medicine

## 2016-07-27 ENCOUNTER — Encounter: Payer: Self-pay | Admitting: Sports Medicine

## 2016-07-27 VITALS — BP 130/70 | Ht 79.0 in | Wt 238.0 lb

## 2016-07-27 DIAGNOSIS — M67952 Unspecified disorder of synovium and tendon, left thigh: Secondary | ICD-10-CM | POA: Diagnosis not present

## 2016-07-27 NOTE — Patient Instructions (Addendum)
   It was great seeing you today!  Add lift to left shoe and see if this makes walking more comfortable for you.  Please add an ankle weight to your exercises to help strengthen gluteus medius.   Ask your trainer to work gluteus maximus on left. We would expect in 6 weeks for your strength in these muscles to be improved.   Do some hip extension exercises.  You may work out on the bike for cardio.  Lucila Maine, DO PGY-2, [facility] 07/27/2016 9:37 AM

## 2016-07-27 NOTE — Progress Notes (Signed)
   Subjective:    Patient ID: Paul Acosta, male    DOB: 1950/11/10, 66 y.o.   MRN: [id number]  HPI cc- follow up for left hip pain  Doing very well, doing exercises consistently with improvement. He wants to know if he can ride bike at this time instead of doing elliptical. He lost the heel lift and has not used this at all. Reports gait is "wonky still". Denies pain in hip but reports some persistent swelling. Working with Physiological scientist.   PCP wanted to start statin- patient has not done so yet, he would like to see if he can manage cholesterol with diet/exercise instead.  Meds- mobic for low back pain, aspirin, multivitamin PSxHx- right hip replacement  Review of Systems- no low back pain, no numbness/tingling or weakness in lower extremities      Objective:   Physical Exam  Well nourished in NAD BP 130/70   Ht 6\' 7"  (2.007 m)   Wt 238 lb (108 kg)   BMI 26.81 kg/m    Left hip- non-tender to palpation over great trochanter. Full ROM. Decreased strength in hip abduction, 5/5 strength in hip flexion. Neurovascularly in tact.   Extreme pronation of right foot.with ankle foot subluxation  Walking gait with less trendelenburg/ Still rotates pelvis but much improved over last visit and no antalgia     Assessment & Plan:   Tendinopathy of left gluteus medius- improving but weakness persistent. -continue abduction exercises, add ankle weights -continue to work with personal trainer and add exercises to strengthen gluteus maximus muscle -heel lift given for left shoe to help normalize gait -patient advised he can ride stationary bike for cardiovascular work out -follow up as needed

## 2016-09-26 DIAGNOSIS — H52221 Regular astigmatism, right eye: Secondary | ICD-10-CM | POA: Diagnosis not present

## 2016-09-26 DIAGNOSIS — H2511 Age-related nuclear cataract, right eye: Secondary | ICD-10-CM | POA: Diagnosis not present

## 2016-09-26 DIAGNOSIS — Z961 Presence of intraocular lens: Secondary | ICD-10-CM | POA: Diagnosis not present

## 2016-09-26 DIAGNOSIS — H1851 Endothelial corneal dystrophy: Secondary | ICD-10-CM | POA: Diagnosis not present

## 2016-10-19 DIAGNOSIS — H2511 Age-related nuclear cataract, right eye: Secondary | ICD-10-CM | POA: Diagnosis not present

## 2016-11-23 DIAGNOSIS — M76892 Other specified enthesopathies of left lower limb, excluding foot: Secondary | ICD-10-CM | POA: Diagnosis not present

## 2016-11-23 DIAGNOSIS — R269 Unspecified abnormalities of gait and mobility: Secondary | ICD-10-CM | POA: Diagnosis not present

## 2016-11-23 DIAGNOSIS — M2141 Flat foot [pes planus] (acquired), right foot: Secondary | ICD-10-CM | POA: Diagnosis not present

## 2016-11-23 DIAGNOSIS — M1612 Unilateral primary osteoarthritis, left hip: Secondary | ICD-10-CM | POA: Diagnosis not present

## 2016-11-23 DIAGNOSIS — M25552 Pain in left hip: Secondary | ICD-10-CM | POA: Diagnosis not present

## 2016-11-30 DIAGNOSIS — Z800 Family history of malignant neoplasm of digestive organs: Secondary | ICD-10-CM | POA: Diagnosis not present

## 2016-11-30 DIAGNOSIS — K640 First degree hemorrhoids: Secondary | ICD-10-CM | POA: Diagnosis not present

## 2016-11-30 DIAGNOSIS — Z86010 Personal history of colonic polyps: Secondary | ICD-10-CM | POA: Diagnosis not present

## 2016-11-30 DIAGNOSIS — K635 Polyp of colon: Secondary | ICD-10-CM | POA: Diagnosis not present

## 2016-12-05 DIAGNOSIS — K635 Polyp of colon: Secondary | ICD-10-CM | POA: Diagnosis not present

## 2016-12-18 DIAGNOSIS — M76892 Other specified enthesopathies of left lower limb, excluding foot: Secondary | ICD-10-CM | POA: Diagnosis not present

## 2017-06-13 DIAGNOSIS — Z85828 Personal history of other malignant neoplasm of skin: Secondary | ICD-10-CM | POA: Diagnosis not present

## 2017-06-13 DIAGNOSIS — Z1283 Encounter for screening for malignant neoplasm of skin: Secondary | ICD-10-CM | POA: Diagnosis not present

## 2017-06-13 DIAGNOSIS — L570 Actinic keratosis: Secondary | ICD-10-CM | POA: Diagnosis not present

## 2017-06-13 DIAGNOSIS — D485 Neoplasm of uncertain behavior of skin: Secondary | ICD-10-CM | POA: Diagnosis not present

## 2017-06-13 DIAGNOSIS — L578 Other skin changes due to chronic exposure to nonionizing radiation: Secondary | ICD-10-CM | POA: Diagnosis not present

## 2017-06-13 DIAGNOSIS — L821 Other seborrheic keratosis: Secondary | ICD-10-CM | POA: Diagnosis not present

## 2017-07-11 DIAGNOSIS — M25571 Pain in right ankle and joints of right foot: Secondary | ICD-10-CM | POA: Diagnosis not present

## 2017-07-27 DIAGNOSIS — E7800 Pure hypercholesterolemia, unspecified: Secondary | ICD-10-CM | POA: Diagnosis not present

## 2017-07-30 DIAGNOSIS — I10 Essential (primary) hypertension: Secondary | ICD-10-CM | POA: Diagnosis not present

## 2017-07-30 DIAGNOSIS — Z125 Encounter for screening for malignant neoplasm of prostate: Secondary | ICD-10-CM | POA: Diagnosis not present

## 2017-07-30 DIAGNOSIS — E7800 Pure hypercholesterolemia, unspecified: Secondary | ICD-10-CM | POA: Diagnosis not present

## 2017-07-30 DIAGNOSIS — Z0000 Encounter for general adult medical examination without abnormal findings: Secondary | ICD-10-CM | POA: Diagnosis not present

## 2017-07-30 DIAGNOSIS — Z23 Encounter for immunization: Secondary | ICD-10-CM | POA: Diagnosis not present

## 2017-07-30 DIAGNOSIS — Z6830 Body mass index (BMI) 30.0-30.9, adult: Secondary | ICD-10-CM | POA: Diagnosis not present

## 2017-07-30 DIAGNOSIS — M545 Low back pain: Secondary | ICD-10-CM | POA: Diagnosis not present

## 2017-07-30 DIAGNOSIS — Z1389 Encounter for screening for other disorder: Secondary | ICD-10-CM | POA: Diagnosis not present

## 2017-07-30 DIAGNOSIS — N529 Male erectile dysfunction, unspecified: Secondary | ICD-10-CM | POA: Diagnosis not present

## 2017-08-13 DIAGNOSIS — M2141 Flat foot [pes planus] (acquired), right foot: Secondary | ICD-10-CM | POA: Diagnosis not present

## 2017-08-13 DIAGNOSIS — M25571 Pain in right ankle and joints of right foot: Secondary | ICD-10-CM | POA: Diagnosis not present

## 2017-08-13 DIAGNOSIS — M19071 Primary osteoarthritis, right ankle and foot: Secondary | ICD-10-CM | POA: Diagnosis not present

## 2017-09-14 DIAGNOSIS — J069 Acute upper respiratory infection, unspecified: Secondary | ICD-10-CM | POA: Diagnosis not present

## 2017-09-25 DIAGNOSIS — M25771 Osteophyte, right ankle: Secondary | ICD-10-CM | POA: Diagnosis not present

## 2017-09-25 DIAGNOSIS — M25371 Other instability, right ankle: Secondary | ICD-10-CM | POA: Diagnosis not present

## 2017-09-25 DIAGNOSIS — G8918 Other acute postprocedural pain: Secondary | ICD-10-CM | POA: Diagnosis not present

## 2017-09-25 DIAGNOSIS — S86111A Strain of other muscle(s) and tendon(s) of posterior muscle group at lower leg level, right leg, initial encounter: Secondary | ICD-10-CM | POA: Diagnosis not present

## 2017-09-25 DIAGNOSIS — I10 Essential (primary) hypertension: Secondary | ICD-10-CM | POA: Diagnosis not present

## 2017-09-25 DIAGNOSIS — M24571 Contracture, right ankle: Secondary | ICD-10-CM | POA: Diagnosis not present

## 2017-09-25 DIAGNOSIS — Z79899 Other long term (current) drug therapy: Secondary | ICD-10-CM | POA: Diagnosis not present

## 2017-09-25 DIAGNOSIS — M66371 Spontaneous rupture of flexor tendons, right ankle and foot: Secondary | ICD-10-CM | POA: Diagnosis not present

## 2017-09-25 DIAGNOSIS — Z791 Long term (current) use of non-steroidal anti-inflammatories (NSAID): Secondary | ICD-10-CM | POA: Diagnosis not present

## 2017-09-25 DIAGNOSIS — M62461 Contracture of muscle, right lower leg: Secondary | ICD-10-CM | POA: Diagnosis not present

## 2017-09-25 DIAGNOSIS — Z96641 Presence of right artificial hip joint: Secondary | ICD-10-CM | POA: Diagnosis not present

## 2017-09-25 DIAGNOSIS — M25871 Other specified joint disorders, right ankle and foot: Secondary | ICD-10-CM | POA: Diagnosis not present

## 2017-09-25 DIAGNOSIS — M2141 Flat foot [pes planus] (acquired), right foot: Secondary | ICD-10-CM | POA: Diagnosis not present

## 2017-09-26 DIAGNOSIS — M2141 Flat foot [pes planus] (acquired), right foot: Secondary | ICD-10-CM | POA: Diagnosis not present

## 2017-09-26 DIAGNOSIS — S86111A Strain of other muscle(s) and tendon(s) of posterior muscle group at lower leg level, right leg, initial encounter: Secondary | ICD-10-CM | POA: Diagnosis not present

## 2017-09-26 DIAGNOSIS — M25371 Other instability, right ankle: Secondary | ICD-10-CM | POA: Diagnosis not present

## 2017-09-26 DIAGNOSIS — I10 Essential (primary) hypertension: Secondary | ICD-10-CM | POA: Diagnosis not present

## 2017-09-26 DIAGNOSIS — M62461 Contracture of muscle, right lower leg: Secondary | ICD-10-CM | POA: Diagnosis not present

## 2017-09-26 DIAGNOSIS — M25871 Other specified joint disorders, right ankle and foot: Secondary | ICD-10-CM | POA: Diagnosis not present

## 2017-10-08 DIAGNOSIS — M79671 Pain in right foot: Secondary | ICD-10-CM | POA: Diagnosis not present

## 2017-10-08 DIAGNOSIS — M25571 Pain in right ankle and joints of right foot: Secondary | ICD-10-CM | POA: Diagnosis not present

## 2017-11-05 DIAGNOSIS — M79671 Pain in right foot: Secondary | ICD-10-CM | POA: Diagnosis not present

## 2017-12-24 DIAGNOSIS — S8251XA Displaced fracture of medial malleolus of right tibia, initial encounter for closed fracture: Secondary | ICD-10-CM | POA: Diagnosis not present

## 2017-12-24 DIAGNOSIS — M19071 Primary osteoarthritis, right ankle and foot: Secondary | ICD-10-CM | POA: Diagnosis not present

## 2017-12-24 DIAGNOSIS — M21071 Valgus deformity, not elsewhere classified, right ankle: Secondary | ICD-10-CM | POA: Diagnosis not present

## 2017-12-24 DIAGNOSIS — M79671 Pain in right foot: Secondary | ICD-10-CM | POA: Diagnosis not present

## 2017-12-27 DIAGNOSIS — M25571 Pain in right ankle and joints of right foot: Secondary | ICD-10-CM | POA: Diagnosis not present

## 2017-12-27 DIAGNOSIS — M25671 Stiffness of right ankle, not elsewhere classified: Secondary | ICD-10-CM | POA: Diagnosis not present

## 2017-12-27 DIAGNOSIS — S93491D Sprain of other ligament of right ankle, subsequent encounter: Secondary | ICD-10-CM | POA: Diagnosis not present

## 2018-01-03 DIAGNOSIS — M25571 Pain in right ankle and joints of right foot: Secondary | ICD-10-CM | POA: Diagnosis not present

## 2018-01-03 DIAGNOSIS — M25671 Stiffness of right ankle, not elsewhere classified: Secondary | ICD-10-CM | POA: Diagnosis not present

## 2018-01-03 DIAGNOSIS — S93491D Sprain of other ligament of right ankle, subsequent encounter: Secondary | ICD-10-CM | POA: Diagnosis not present

## 2018-01-14 DIAGNOSIS — M25571 Pain in right ankle and joints of right foot: Secondary | ICD-10-CM | POA: Diagnosis not present

## 2018-01-14 DIAGNOSIS — M25671 Stiffness of right ankle, not elsewhere classified: Secondary | ICD-10-CM | POA: Diagnosis not present

## 2018-01-14 DIAGNOSIS — S93491D Sprain of other ligament of right ankle, subsequent encounter: Secondary | ICD-10-CM | POA: Diagnosis not present

## 2018-01-15 DIAGNOSIS — M25571 Pain in right ankle and joints of right foot: Secondary | ICD-10-CM | POA: Diagnosis not present

## 2018-01-15 DIAGNOSIS — S93491D Sprain of other ligament of right ankle, subsequent encounter: Secondary | ICD-10-CM | POA: Diagnosis not present

## 2018-01-15 DIAGNOSIS — M25671 Stiffness of right ankle, not elsewhere classified: Secondary | ICD-10-CM | POA: Diagnosis not present

## 2018-01-17 DIAGNOSIS — M25671 Stiffness of right ankle, not elsewhere classified: Secondary | ICD-10-CM | POA: Diagnosis not present

## 2018-01-17 DIAGNOSIS — S93491D Sprain of other ligament of right ankle, subsequent encounter: Secondary | ICD-10-CM | POA: Diagnosis not present

## 2018-01-17 DIAGNOSIS — M25571 Pain in right ankle and joints of right foot: Secondary | ICD-10-CM | POA: Diagnosis not present

## 2018-02-08 DIAGNOSIS — H6990 Unspecified Eustachian tube disorder, unspecified ear: Secondary | ICD-10-CM | POA: Diagnosis not present

## 2018-02-08 DIAGNOSIS — H9319 Tinnitus, unspecified ear: Secondary | ICD-10-CM | POA: Diagnosis not present

## 2018-03-18 DIAGNOSIS — M25571 Pain in right ankle and joints of right foot: Secondary | ICD-10-CM | POA: Diagnosis not present

## 2018-03-18 DIAGNOSIS — M79671 Pain in right foot: Secondary | ICD-10-CM | POA: Diagnosis not present

## 2018-03-27 DIAGNOSIS — J309 Allergic rhinitis, unspecified: Secondary | ICD-10-CM | POA: Diagnosis not present

## 2018-03-27 DIAGNOSIS — E7800 Pure hypercholesterolemia, unspecified: Secondary | ICD-10-CM | POA: Diagnosis not present

## 2018-03-27 DIAGNOSIS — Z6830 Body mass index (BMI) 30.0-30.9, adult: Secondary | ICD-10-CM | POA: Diagnosis not present

## 2018-03-27 DIAGNOSIS — I10 Essential (primary) hypertension: Secondary | ICD-10-CM | POA: Diagnosis not present

## 2018-03-27 DIAGNOSIS — N529 Male erectile dysfunction, unspecified: Secondary | ICD-10-CM | POA: Diagnosis not present

## 2018-03-27 DIAGNOSIS — M545 Low back pain: Secondary | ICD-10-CM | POA: Diagnosis not present

## 2018-06-07 DIAGNOSIS — M25671 Stiffness of right ankle, not elsewhere classified: Secondary | ICD-10-CM | POA: Diagnosis not present

## 2018-06-07 DIAGNOSIS — M25571 Pain in right ankle and joints of right foot: Secondary | ICD-10-CM | POA: Diagnosis not present

## 2018-06-07 DIAGNOSIS — S93491D Sprain of other ligament of right ankle, subsequent encounter: Secondary | ICD-10-CM | POA: Diagnosis not present

## 2018-06-11 DIAGNOSIS — M25671 Stiffness of right ankle, not elsewhere classified: Secondary | ICD-10-CM | POA: Diagnosis not present

## 2018-06-11 DIAGNOSIS — M25571 Pain in right ankle and joints of right foot: Secondary | ICD-10-CM | POA: Diagnosis not present

## 2018-06-11 DIAGNOSIS — S93491D Sprain of other ligament of right ankle, subsequent encounter: Secondary | ICD-10-CM | POA: Diagnosis not present

## 2018-06-18 DIAGNOSIS — M25671 Stiffness of right ankle, not elsewhere classified: Secondary | ICD-10-CM | POA: Diagnosis not present

## 2018-06-18 DIAGNOSIS — M25571 Pain in right ankle and joints of right foot: Secondary | ICD-10-CM | POA: Diagnosis not present

## 2018-06-18 DIAGNOSIS — S93491D Sprain of other ligament of right ankle, subsequent encounter: Secondary | ICD-10-CM | POA: Diagnosis not present

## 2018-06-20 DIAGNOSIS — M25571 Pain in right ankle and joints of right foot: Secondary | ICD-10-CM | POA: Diagnosis not present

## 2018-06-20 DIAGNOSIS — S93491D Sprain of other ligament of right ankle, subsequent encounter: Secondary | ICD-10-CM | POA: Diagnosis not present

## 2018-06-20 DIAGNOSIS — M25671 Stiffness of right ankle, not elsewhere classified: Secondary | ICD-10-CM | POA: Diagnosis not present

## 2018-06-24 DIAGNOSIS — M79671 Pain in right foot: Secondary | ICD-10-CM | POA: Diagnosis not present

## 2018-06-26 DIAGNOSIS — M25671 Stiffness of right ankle, not elsewhere classified: Secondary | ICD-10-CM | POA: Diagnosis not present

## 2018-06-26 DIAGNOSIS — S93491D Sprain of other ligament of right ankle, subsequent encounter: Secondary | ICD-10-CM | POA: Diagnosis not present

## 2018-06-26 DIAGNOSIS — M25571 Pain in right ankle and joints of right foot: Secondary | ICD-10-CM | POA: Diagnosis not present

## 2018-07-02 DIAGNOSIS — M25671 Stiffness of right ankle, not elsewhere classified: Secondary | ICD-10-CM | POA: Diagnosis not present

## 2018-07-02 DIAGNOSIS — M25571 Pain in right ankle and joints of right foot: Secondary | ICD-10-CM | POA: Diagnosis not present

## 2018-07-02 DIAGNOSIS — S93491D Sprain of other ligament of right ankle, subsequent encounter: Secondary | ICD-10-CM | POA: Diagnosis not present

## 2018-08-01 DIAGNOSIS — E7800 Pure hypercholesterolemia, unspecified: Secondary | ICD-10-CM | POA: Diagnosis not present

## 2018-08-01 DIAGNOSIS — I10 Essential (primary) hypertension: Secondary | ICD-10-CM | POA: Diagnosis not present

## 2018-08-01 DIAGNOSIS — Z6830 Body mass index (BMI) 30.0-30.9, adult: Secondary | ICD-10-CM | POA: Diagnosis not present

## 2018-08-01 DIAGNOSIS — M25552 Pain in left hip: Secondary | ICD-10-CM | POA: Diagnosis not present

## 2018-08-01 DIAGNOSIS — Z1389 Encounter for screening for other disorder: Secondary | ICD-10-CM | POA: Diagnosis not present

## 2018-08-01 DIAGNOSIS — Z125 Encounter for screening for malignant neoplasm of prostate: Secondary | ICD-10-CM | POA: Diagnosis not present

## 2018-08-01 DIAGNOSIS — J309 Allergic rhinitis, unspecified: Secondary | ICD-10-CM | POA: Diagnosis not present

## 2018-08-01 DIAGNOSIS — Z0000 Encounter for general adult medical examination without abnormal findings: Secondary | ICD-10-CM | POA: Diagnosis not present

## 2018-08-01 DIAGNOSIS — N529 Male erectile dysfunction, unspecified: Secondary | ICD-10-CM | POA: Diagnosis not present

## 2018-08-16 DIAGNOSIS — E7800 Pure hypercholesterolemia, unspecified: Secondary | ICD-10-CM | POA: Diagnosis not present

## 2018-08-16 DIAGNOSIS — I10 Essential (primary) hypertension: Secondary | ICD-10-CM | POA: Diagnosis not present

## 2018-08-16 DIAGNOSIS — Z125 Encounter for screening for malignant neoplasm of prostate: Secondary | ICD-10-CM | POA: Diagnosis not present

## 2018-09-17 ENCOUNTER — Ambulatory Visit (INDEPENDENT_AMBULATORY_CARE_PROVIDER_SITE_OTHER): Payer: Medicare Other | Admitting: Sports Medicine

## 2018-09-17 ENCOUNTER — Other Ambulatory Visit: Payer: Self-pay

## 2018-09-17 ENCOUNTER — Encounter: Payer: Self-pay | Admitting: Sports Medicine

## 2018-09-17 VITALS — BP 146/81 | Ht 78.0 in | Wt 250.0 lb

## 2018-09-17 DIAGNOSIS — M25552 Pain in left hip: Secondary | ICD-10-CM

## 2018-09-17 NOTE — Progress Notes (Signed)
[facility] [address] [place], [place] [address] Phone: [phone] Fax: [phone]   Patient Name: Paul Acosta Date of Birth: 12/21/1950 Medical Record Number: [id number] Gender: male Date of Encounter: 09/17/2018  SUBJECTIVE:      Chief Complaint:  Left hip pain   HPI:  [name] is a 68 year old male presenting with chronic left hip pain.  He was last year seen in our office 2 years ago and diagnosed with gluteus medius tendinopathy confirmed with diagnostic ultrasound.  Since that time he has been working with a physical therapist and trainer to strengthen the hip girdle.  Yesterday he was doing a workout with his trainer, and felt shooting pain in the lateral hip and today feels like the hip is swollen.  Monday he was able to walk 3 miles with minimal pain.  He is unable to golf regularly because after walking more than 5 holes, the lateral hip pain persist.  Intermittently it will wake him up at night if he rolls onto that side.  Describes radiating pain down the outside of his left leg that rarely goes below the knee.  He denies groin pain.  Denies bruising, numbness, erythema.  In regards to history, he had a right ankle reconstruction 1 year ago after being diagnosed as a chronic foot pronator.  He feels that his left hip is so weak because he has been walking unbalanced for so many years.  He had his right hip replaced in 2015.  He has been seen by 2 different orthopedic offices and been told the left hip joint is healthy and well preserved.  He feels he is not making as much progress as he should be with the pain.  He has had both intra-articular hip and greater trochanteric bursa injections in the past, never receiving more than 1 month of benefit.  I reviewed prior records from his orthopedic office and reviewed plain film imaging of his left hip with interpretation with patient today.     ROS:     See HPI.   PERTINENT  PMH / PSH / FH / SH:   Past Medical, Surgical, Social, and Family History Reviewed & Updated in the EMR.  Pertinent findings include:  See HPI   OBJECTIVE:  BP (!) 146/81   Ht 6\' 6"  (1.981 m)   Wt 250 lb (113.4 kg)   BMI 28.89 kg/m  Physical Exam:  Vital signs are reviewed.   GEN: Alert and oriented, NAD Pulm: Breathing unlabored PSY: normal mood, congruent affect  MSK: Left hip: ROM IR: 40 Deg, ER: 45 Deg, Flexion: 120 Deg, Extension: 100 Deg, Abduction: 45 Deg, Adduction: 45 Deg Strength IR: 5/5, ER: 4/5, Flexion: 4/5 with knee flexed, 3+/5 with knee extended, Extension: 5/5, Abduction: 3+/5, Adduction: 5/5 Pelvic alignment unremarkable to inspection Negative pelvic compression test Positive left Trendelenburg sign Greater trochanter without tenderness to palpation. No tenderness over piriformis. No pain with FABER  Minimal pain with FADIR No SI joint tenderness and normal minimal SI movement. Negative straight leg raise NVI   ASSESSMENT & PLAN:   1. Chronic left hip pain  Given that patient has been treated conservatively for greater than 2 years with minimal improvement and extensive weakness in hip muscles today on exam, we will go forward with a MRI of the left hip to further evaluate possible muscle tears in the area.  He is to continue working with therapy in the interim, and we will follow-up after the MRI is  complete.  Patient's goal is to be able to golf and walk pain-free.   Lanier Clam, DO, [facility] Sports Medicine Fellow  Patient seen and evaluated with the sports medicine fellow.  I agree with the above plan of care.  Patient has had extensive conservative treatment over the past 2 years including home physical therapy and cortisone injections.  He has made minimal progress over this time.  We will proceed with an MRI of the left hip specifically to rule out gluteus medius tendon tearing.  Follow-up with [place] in the office after that study to review the results and delineate further  work-up and treatment.  Depending on those results, I may also need to consider an EMG/nerve conduction study given his weakness on exam.

## 2018-10-11 ENCOUNTER — Ambulatory Visit
Admission: RE | Admit: 2018-10-11 | Discharge: 2018-10-11 | Disposition: A | Discharge status: Home / Self Care | Payer: Medicare Other | Source: Ambulatory Visit | Attending: Sports Medicine | Admitting: Sports Medicine

## 2018-10-11 ENCOUNTER — Other Ambulatory Visit: Payer: Self-pay

## 2018-10-11 DIAGNOSIS — M25552 Pain in left hip: Secondary | ICD-10-CM

## 2018-10-11 DIAGNOSIS — Z23 Encounter for immunization: Secondary | ICD-10-CM | POA: Diagnosis not present

## 2018-10-11 DIAGNOSIS — M1612 Unilateral primary osteoarthritis, left hip: Secondary | ICD-10-CM | POA: Diagnosis not present

## 2018-10-15 ENCOUNTER — Telehealth: Payer: Self-pay | Admitting: Sports Medicine

## 2018-10-15 NOTE — Telephone Encounter (Signed)
  I spoke with Paul Acosta today after reviewing the MRI of his left hip.  MRI shows that the left gluteus medius tendon is torn from its attachment site onto the greater trochanter.  There is associated edema and fatty atrophy of the muscle.  These findings fit his clinical picture.  I recommended consultation with Dr. Mayer Camel and the patient agrees.  Dr. Mayer Camel did his previous right hip arthroplasty.  I will defer further work-up and treatment to the discretion of Dr. Mayer Camel the patient will follow-up with me as needed.

## 2018-10-25 DIAGNOSIS — M1612 Unilateral primary osteoarthritis, left hip: Secondary | ICD-10-CM | POA: Diagnosis not present

## 2018-10-25 DIAGNOSIS — M25562 Pain in left knee: Secondary | ICD-10-CM | POA: Diagnosis not present

## 2018-11-12 DIAGNOSIS — S76012A Strain of muscle, fascia and tendon of left hip, initial encounter: Secondary | ICD-10-CM | POA: Diagnosis not present

## 2018-11-12 DIAGNOSIS — Z96641 Presence of right artificial hip joint: Secondary | ICD-10-CM | POA: Diagnosis not present

## 2018-11-12 DIAGNOSIS — X58XXXA Exposure to other specified factors, initial encounter: Secondary | ICD-10-CM | POA: Diagnosis not present

## 2018-11-12 DIAGNOSIS — M76892 Other specified enthesopathies of left lower limb, excluding foot: Secondary | ICD-10-CM | POA: Diagnosis not present

## 2018-11-12 DIAGNOSIS — M1612 Unilateral primary osteoarthritis, left hip: Secondary | ICD-10-CM | POA: Diagnosis not present

## 2018-12-05 DIAGNOSIS — I10 Essential (primary) hypertension: Secondary | ICD-10-CM | POA: Diagnosis not present

## 2018-12-05 DIAGNOSIS — E7800 Pure hypercholesterolemia, unspecified: Secondary | ICD-10-CM | POA: Diagnosis not present

## 2018-12-05 DIAGNOSIS — G459 Transient cerebral ischemic attack, unspecified: Secondary | ICD-10-CM | POA: Diagnosis not present

## 2019-02-11 DIAGNOSIS — M76892 Other specified enthesopathies of left lower limb, excluding foot: Secondary | ICD-10-CM | POA: Diagnosis not present

## 2019-02-11 DIAGNOSIS — X500XXD Overexertion from strenuous movement or load, subsequent encounter: Secondary | ICD-10-CM | POA: Diagnosis not present

## 2019-02-11 DIAGNOSIS — S76012D Strain of muscle, fascia and tendon of left hip, subsequent encounter: Secondary | ICD-10-CM | POA: Diagnosis not present

## 2019-03-13 DIAGNOSIS — G459 Transient cerebral ischemic attack, unspecified: Secondary | ICD-10-CM | POA: Diagnosis not present

## 2019-03-13 DIAGNOSIS — I10 Essential (primary) hypertension: Secondary | ICD-10-CM | POA: Diagnosis not present

## 2019-03-13 DIAGNOSIS — E7800 Pure hypercholesterolemia, unspecified: Secondary | ICD-10-CM | POA: Diagnosis not present

## 2019-03-20 DIAGNOSIS — Z23 Encounter for immunization: Secondary | ICD-10-CM | POA: Diagnosis not present

## 2019-04-16 DIAGNOSIS — G459 Transient cerebral ischemic attack, unspecified: Secondary | ICD-10-CM | POA: Diagnosis not present

## 2019-04-16 DIAGNOSIS — I10 Essential (primary) hypertension: Secondary | ICD-10-CM | POA: Diagnosis not present

## 2019-04-16 DIAGNOSIS — E7800 Pure hypercholesterolemia, unspecified: Secondary | ICD-10-CM | POA: Diagnosis not present

## 2019-04-16 DIAGNOSIS — I209 Angina pectoris, unspecified: Secondary | ICD-10-CM | POA: Diagnosis not present

## 2019-04-17 ENCOUNTER — Other Ambulatory Visit: Payer: Self-pay

## 2019-04-17 ENCOUNTER — Ambulatory Visit (INDEPENDENT_AMBULATORY_CARE_PROVIDER_SITE_OTHER): Payer: Medicare Other | Admitting: Pediatrics

## 2019-04-17 VITALS — BP 118/82 | Ht 78.0 in | Wt 235.0 lb

## 2019-04-17 DIAGNOSIS — Z23 Encounter for immunization: Secondary | ICD-10-CM | POA: Diagnosis not present

## 2019-04-17 DIAGNOSIS — G5621 Lesion of ulnar nerve, right upper limb: Secondary | ICD-10-CM | POA: Diagnosis present

## 2019-04-17 NOTE — Progress Notes (Addendum)
  Paul Acosta - 70 y.o. male MRN [id number]  Date of birth: [birth year]  SUBJECTIVE:   CC: right elbow pain   69 yo gentleman presenting for right elbow pain for the past year. He reports that he was lifting his 120 lb dog last year to get into a car and he had pain in his medial right elbow with a pain that ran down to his 5th finger. Since that time, he has the same pain in his medial elbow when he does anything with extreme flexion of his elbows. He feels it mostly when he is lifting weights, doing bicep curls or rowing. He lifts 3x a week. He has tried ice, heat, using a TENS unit over the area without much improvement. He take 15 mg meloxicam daily and does not feel like it makes a difference. No weakness noted in hand. He rarely gets shooting pain down to his hands or numbness in his fingers.    ROS: No unexpected weight loss, fever, chills, swelling, instability, muscle pain, numbness/tingling, redness, otherwise see HPI   PMHx - Updated and reviewed.  Contributory factors include: Negative PSHx - Updated and reviewed.  Contributory factors include:  Negative FHx - Updated and reviewed.  Contributory factors include:  Negative Social Hx - Updated and reviewed. Contributory factors include: Negative Medications - reviewed   DATA REVIEWED: none  PHYSICAL EXAM:  VS: BP:118/82  HR: bpm  TEMP: ( )  RESP:   HT:6\' 6"  (198.1 cm)   WT:235 lb (106.6 kg)  BMI:27.16 PHYSICAL EXAM: Gen: NAD, alert, cooperative with exam, well-appearing HEENT: clear conjunctiva,  CV:  no edema, capillary refill brisk, normal rate Resp: non-labored Skin: no rashes, normal turgor  Neuro: no gross deficits.  Psych:  alert and oriented  Right Elbow: - Inspection: no obvious deformity. No swelling, erythema or bruising - Palpation: TTP over cubital tunnel - ROM: full active ROM in flexion and extension. No crepitus - Strength: 5/5 strength in wrist flexion and extension without pain. 5/5 strength in  biceps, triceps. - Neuro: NV intact distally - Special testing: no laxity with varus/valgus stress, negative milking maneuver. No pain with resisted ECRB or supination. Postiive tinel's at cubital tunnel. No subluxation noted of ulnar nerve  No weakness in hands. No numbness over hands or forearm.  Ultrasound, right elbow Ulnar nerve visualized in the cubital tunnel, no surrounding fluid. Does not appear to sublux with supination.   Impression: normal appearing ulnar nerve in cubital tunnel   ASSESSMENT & PLAN:   69 yo male presenting with medial elbow pain with symptoms reproducible with tinel's over cubital tunnel and with extreme flexion of arm, suggesting that he is has cubital tunnel syndrome with impingement of nerve with flexion that is causing pain. Will start with brace on arm to prevent flexion as well as ulnar nerve flossing exercises. Discussed option of prednisone dose pack vs gabpentin for nerve pain vs injection and patient would like injection of area. However, he is getting his second COVID vaccine today so we will wait 2 weeks after immunization. He will return in 2 weeks for 3:1 corticosteroid injection of cubital tunnel.  Addendum:  I was the preceptor for this visit and available for immediate consultation.  Karlton Lemon MD Kirt Boys

## 2019-04-17 NOTE — Patient Instructions (Signed)
   You essentially want a brace that prevents you from fully flexing your elbow.  Return in 2 weeks for injection of cubital tunnel.

## 2019-04-19 ENCOUNTER — Other Ambulatory Visit: Payer: Self-pay

## 2019-04-19 ENCOUNTER — Emergency Department (HOSPITAL_COMMUNITY): Payer: Medicare Other

## 2019-04-19 ENCOUNTER — Inpatient Hospital Stay (HOSPITAL_COMMUNITY)
Admission: EM | Admit: 2019-04-19 | Discharge: 2019-04-22 | DRG: 247 | Disposition: A | Discharge status: Home / Self Care | Payer: Medicare Other | Attending: Internal Medicine | Admitting: Internal Medicine

## 2019-04-19 ENCOUNTER — Encounter (HOSPITAL_COMMUNITY): Payer: Self-pay | Admitting: Family Medicine

## 2019-04-19 DIAGNOSIS — I10 Essential (primary) hypertension: Secondary | ICD-10-CM

## 2019-04-19 DIAGNOSIS — I214 Non-ST elevation (NSTEMI) myocardial infarction: Secondary | ICD-10-CM

## 2019-04-19 DIAGNOSIS — Z20822 Contact with and (suspected) exposure to covid-19: Secondary | ICD-10-CM | POA: Diagnosis not present

## 2019-04-19 DIAGNOSIS — I440 Atrioventricular block, first degree: Secondary | ICD-10-CM | POA: Diagnosis present

## 2019-04-19 DIAGNOSIS — E782 Mixed hyperlipidemia: Secondary | ICD-10-CM | POA: Diagnosis not present

## 2019-04-19 DIAGNOSIS — I452 Bifascicular block: Secondary | ICD-10-CM | POA: Diagnosis not present

## 2019-04-19 DIAGNOSIS — I25119 Atherosclerotic heart disease of native coronary artery with unspecified angina pectoris: Secondary | ICD-10-CM | POA: Diagnosis present

## 2019-04-19 DIAGNOSIS — Z96641 Presence of right artificial hip joint: Secondary | ICD-10-CM | POA: Diagnosis present

## 2019-04-19 DIAGNOSIS — R079 Chest pain, unspecified: Secondary | ICD-10-CM | POA: Diagnosis not present

## 2019-04-19 DIAGNOSIS — Z955 Presence of coronary angioplasty implant and graft: Secondary | ICD-10-CM

## 2019-04-19 DIAGNOSIS — Z7982 Long term (current) use of aspirin: Secondary | ICD-10-CM

## 2019-04-19 DIAGNOSIS — I4510 Unspecified right bundle-branch block: Secondary | ICD-10-CM | POA: Diagnosis present

## 2019-04-19 DIAGNOSIS — Z8249 Family history of ischemic heart disease and other diseases of the circulatory system: Secondary | ICD-10-CM

## 2019-04-19 DIAGNOSIS — Z79899 Other long term (current) drug therapy: Secondary | ICD-10-CM

## 2019-04-19 HISTORY — DX: Essential (primary) hypertension: I10

## 2019-04-19 LAB — TROPONIN I (HIGH SENSITIVITY)
Troponin I (High Sensitivity): 29 ng/L — ABNORMAL HIGH (ref <18)
Troponin I (High Sensitivity): 78 ng/L — ABNORMAL HIGH (ref <18)

## 2019-04-19 LAB — CBC
HCT: 41.4 % (ref 39.0–52.0)
Hemoglobin: 13.8 g/dL (ref 13.0–17.0)
MCH: 30.1 pg (ref 26.0–34.0)
MCHC: 33.3 g/dL (ref 30.0–36.0)
MCV: 90.4 fL (ref 80.0–100.0)
Platelets: 171 10*3/uL (ref 150–400)
RBC: 4.58 MIL/uL (ref 4.22–5.81)
RDW: 14.1 % (ref 11.5–15.5)
WBC: 4.2 10*3/uL (ref 4.0–10.5)
nRBC: 0 % (ref 0.0–0.2)

## 2019-04-19 LAB — BASIC METABOLIC PANEL
Anion gap: 7 (ref 5–15)
BUN: 30 mg/dL — ABNORMAL HIGH (ref 8–23)
CO2: 26 mmol/L (ref 22–32)
Calcium: 9 mg/dL (ref 8.9–10.3)
Chloride: 108 mmol/L (ref 98–111)
Creatinine, Ser: 1.1 mg/dL (ref 0.61–1.24)
GFR calc Af Amer: >60 mL/min (ref >60)
GFR calc non Af Amer: >60 mL/min (ref >60)
Glucose, Bld: 92 mg/dL (ref 70–99)
Potassium: 4 mmol/L (ref 3.5–5.1)
Sodium: 141 mmol/L (ref 135–145)

## 2019-04-19 LAB — LIPID PANEL
Cholesterol: 170 mg/dL (ref 0–200)
HDL: 47 mg/dL (ref >40)
LDL Cholesterol: 90 mg/dL (ref 0–99)
Total CHOL/HDL Ratio: 3.6 RATIO
Triglycerides: 166 mg/dL — ABNORMAL HIGH (ref <150)
VLDL: 33 mg/dL (ref 0–40)

## 2019-04-19 LAB — PROTIME-INR
INR: 1 (ref 0.8–1.2)
Prothrombin Time: 13.2 seconds (ref 11.4–15.2)

## 2019-04-19 LAB — MAGNESIUM: Magnesium: 2.4 mg/dL (ref 1.7–2.4)

## 2019-04-19 LAB — APTT: aPTT: 32 seconds (ref 24–36)

## 2019-04-19 MED ORDER — ASPIRIN EC 81 MG PO TBEC
81.0000 mg | DELAYED_RELEASE_TABLET | Freq: Every day | ORAL | Status: DC
  Administered 2019-04-20 – 2019-04-22 (×2): 81 mg via ORAL
  Filled 2019-04-19 (×3): qty 1

## 2019-04-19 MED ORDER — ASPIRIN 81 MG PO CHEW
324.0000 mg | CHEWABLE_TABLET | Freq: Once | ORAL | Status: AC
  Administered 2019-04-19: 243 mg via ORAL
  Filled 2019-04-19: qty 4

## 2019-04-19 MED ORDER — ENOXAPARIN SODIUM 40 MG/0.4ML ~~LOC~~ SOLN
40.0000 mg | Freq: Every day | SUBCUTANEOUS | Status: DC
  Administered 2019-04-19: 23:00:00 40 mg via SUBCUTANEOUS
  Filled 2019-04-19: qty 0.4

## 2019-04-19 MED ORDER — ACETAMINOPHEN 325 MG PO TABS: 650.0000 mg | ORAL_TABLET | ORAL | Status: DC | PRN

## 2019-04-19 MED ORDER — ONDANSETRON HCL 4 MG/2ML IJ SOLN: 4.0000 mg | Freq: Four times a day (QID) | INTRAMUSCULAR | Status: DC | PRN

## 2019-04-19 MED ORDER — LOSARTAN POTASSIUM 50 MG PO TABS
50.0000 mg | ORAL_TABLET | Freq: Every day | ORAL | Status: DC
  Administered 2019-04-20 – 2019-04-22 (×3): 50 mg via ORAL
  Filled 2019-04-19 (×3): qty 1

## 2019-04-19 NOTE — H&P (Signed)
[facility] History and Physical  ALDRIC KOEPP [id number] DOB: 05-05-1950 DOA: 04/19/2019  Referring EDP: Francia Greaves, MD PCP: Maury Dus, MD   Chief Complaint: Chest Pain   HPI: Paul Acosta is a 69 y.o. male with PMH of HTN presented to ER for chest pain and admitted for ACS rule-out. Patient reports that for the past week he has noted progressive worsening of chest pain with exertion. First noted last week when exercising but was able to complete his bicycling at a slower pace. Chest pain is located centrally, sub-sternal and does not radiate. Denies diaphoresis, worsening with inspiration, burning-like pain. It is worsened by movement and relieved quickly with resting. He continued to notice this pain intermittently throughout the week and contacted PCP who ordered a stress test which is scheduled for next week as they are going to the beach this week. Today, he went to [employer] with his girlfriend and walked around and felt fine. They went to eat lunch and then he was helping unload a few groceries at home and again noted the pain only worse than prior and decided to come to the ER. No chest pain currently but he has felt an "odd sensation" when he walked to the bathroom which quickly went away when he sat back in bed. He has never smoked tobacco. In the summers he does drink > 12 beers weekly but otherwise only a few drinks monthly. Denies headache, dizziness, fever, chills, cough, SOB, abdominal pain, nausea, vomiting, diarrhea, constipation, dysuria, hematuria, hematochezia, melena, difficulty moving arms/legs, speech difficulty, trouble eating, confusion or any other complaints.  In the ED: Mild hypertension otherwise vitals stable. EKG with RBBB, LAFB and ST depression (only EKG from 2015 as comparison). Labs remarkable for CBC and BMP WNL. Troponin 29>78. CXR without acute cardiopulmonary abnormality. Patient given Aspirin and admission requested for ACS rule-out. EDP spoke with  on-call Cardiology fellow who reviewed chart and reported patient could stay at [facility] will follow.   Review of Systems:  All other systems negative unless noted above in HPI.   Past Medical History:  Diagnosis Date  . Medical history non-contributory    Past Surgical History:  Procedure Laterality Date  . EYE SURGERY    . TOTAL HIP ARTHROPLASTY Right 11/07/2013   Procedure: RIGHT TOTAL HIP ARTHROPLASTY;  Surgeon: Kerin Salen, MD;  Location: [facility];  Service: Orthopedics;  Laterality: Right;   Social History:  reports that he has never smoked. He has never used smokeless tobacco. He reports current alcohol use of about 2.0 standard drinks of alcohol per week. No history on file for drug.  No Known Allergies  No family history on file.  Patient reports that his dad had a heart attack in his late 32's or 19's. His mother had a pacemaker placed when she was older as well. His brother had a heart attack in late 36's.  Prior to Admission medications   Medication Sig Start Date End Date Taking? Authorizing Provider  aspirin EC 325 MG tablet Take 1 tablet (325 mg total) by mouth 2 (two) times daily. 11/07/13  Yes Joanell Rising K, PA-C  losartan (COZAAR) 50 MG tablet Take 50 mg by mouth daily. 06/30/18  Yes [provider]  meloxicam (MOBIC) 15 MG tablet  08/01/18  Yes [provider]   Physical Exam: Vitals:   04/19/19 1819 04/19/19 2000 04/19/19 2030 04/19/19 2100  BP: (!) 146/82 (!) 144/102 (!) 154/103 (!) 141/88  Pulse: 76 73 72 64  Resp: 18 19 12 11   Temp:      TempSrc:      SpO2: 100% 97% 98% 94%  Weight:      Height:        Wt Readings from Last 3 Encounters:  04/19/19 106.6 kg  04/17/19 106.6 kg  09/17/18 113.4 kg    . General:  Appears calm and comfortable. AAOx4. Pleasant.  . Eyes: EOMI, normal lids, irises & conjunctiva . ENT: grossly normal hearing, lips & tongue . Neck: normal ROM . Cardiovascular: RRR, no m/r/g. No LE  edema. Marland Kitchen Respiratory: CTA bilaterally, no w/r/r. Normal respiratory effort. . Abdomen: soft, ntnd . Skin: no rash or induration seen on limited exam . Musculoskeletal: grossly normal tone BUE/BLE . Psychiatric: grossly normal mood and affect, speech fluent and appropriate . Neurologic: grossly non-focal.          Labs on Admission:  Basic Metabolic Panel: Recent Labs  Lab 04/19/19 1708  NA 141  K 4.0  CL 108  CO2 26  GLUCOSE 92  BUN 30*  CREATININE 1.10  CALCIUM 9.0   Liver Function Tests: No results for input(s): AST, ALT, ALKPHOS, BILITOT, PROT, ALBUMIN in the last 168 hours. No results for input(s): LIPASE, AMYLASE in the last 168 hours. No results for input(s): AMMONIA in the last 168 hours. CBC: Recent Labs  Lab 04/19/19 1708  WBC 4.2  HGB 13.8  HCT 41.4  MCV 90.4  PLT 171   Cardiac Enzymes: No results for input(s): CKTOTAL, CKMB, CKMBINDEX, TROPONINI in the last 168 hours.  BNP (last 3 results) No results for input(s): BNP in the last 8760 hours.  ProBNP (last 3 results) No results for input(s): PROBNP in the last 8760 hours.  CBG: No results for input(s): GLUCAP in the last 168 hours.  Radiological Exams on Admission: DG Chest 2 View  Result Date: 04/19/2019 CLINICAL DATA:  Chest pain EXAM: CHEST - 2 VIEW COMPARISON:  October 30, 2013 FINDINGS: The lungs are clear. The heart size and pulmonary vascularity are normal. No adenopathy. No pneumothorax. No bone lesions. IMPRESSION: No abnormality noted. Electronically Signed   By: Lowella Grip III M.D.   On: 04/19/2019 16:54    EKG: Independently reviewed. HR 80. Sinus rhythm. QTc 499. RBBB and LAFB present, new when compared to EKG from 2015 along with ST depression. No STEMI.   Assessment/Plan Principal Problem:   Chest pain Active Problems:   HTN (hypertension)  69 y.o. male with PMH of HTN presented to ER for chest pain and admitted for ACS rule-out.  Chest Pain Angina ACS rule-out -  central, sub-sternal chest pain that occurs with exertion and relieved quickly with rest; no pain currently  - PMH significant for HTN only; some family history of CAD in relatives when > 56 yo - No tobacco use; intermittent excessive alcohol use  - HEART score 6 - Troponin 29>78 with EKG as above - [place] - Tele - NPO at midnight; avoid Beta blockers and caffeine - Daily Aspirin - Cardiology consult for stress testing - Lipid panel and A1c ordered on admission for risk stratification   HTN - cont PTA Losartan - patient reports he takes his blood pressure frequently at home and numbers are always 120's/70's  Code Status: Full Code  DVT Prophylaxis: Lovenox Family Communication: Girlfriend, Shirlean Mylar, at bedside  Disposition Plan: Admit to observation. Patient requiring further trending of labs as well as inpatient testing. May require intervention pending these tests. If stress test  unable to be done on Sunday, will need to change to inpatient status.   Time spent: 65 minutes  Chauncey Mann, MD [facility] Pager [phone]

## 2019-04-19 NOTE — ED Provider Notes (Addendum)
[facility] DEPT Provider Note   CSN: [id number] Arrival date & time: 04/19/19  1607     History Chief Complaint  Patient presents with  . Chest Pain    Paul Acosta is a 69 y.o. male.  17-year-old male with prior medical history as detailed below presents for evaluation of reported chest discomfort.  Patient reports that he had an episode of substernal chest pressure 1 week ago during exercise.  He saw his primary doctor about this.  He is being scheduled for cardiac stress test.  Patient reports that this morning he had another episode of substernal chest pressure associated with exertion while walking around a store.  His pain now is resolved.  He denies associated diaphoresis, nausea, vomiting, shortness of breath, or other specific complaint.  He denies prior history of CAD.  His discomfort this morning lasted approximately 2 hours.  The history is provided by the patient and medical records.  Chest Pain Pain location:  Substernal area Pain quality: aching and dull   Pain radiates to:  Does not radiate Pain severity:  Mild Onset quality:  Sudden Duration:  2 hours Timing:  Constant Progression:  Resolved Chronicity:  New Relieved by:  Nothing Worsened by:  Nothing      Past Medical History:  Diagnosis Date  . Medical history non-contributory     Patient Active Problem List   Diagnosis Date Noted  . Leg length inequality 06/13/2016  . Tendinopathy of left gluteus medius 06/13/2016  . Arthritis, hip 11/07/2013  . Osteoarthritis of right hip 09/02/2013  . Right ankle pain 09/02/2013    Past Surgical History:  Procedure Laterality Date  . EYE SURGERY    . TOTAL HIP ARTHROPLASTY Right 11/07/2013   Procedure: RIGHT TOTAL HIP ARTHROPLASTY;  Surgeon: Kerin Salen, MD;  Location: [facility];  Service: Orthopedics;  Laterality: Right;       No family history on file.  Social History   Tobacco Use  . Smoking status: Never Smoker  .  Smokeless tobacco: Never Used  Substance Use Topics  . Alcohol use: Yes    Alcohol/week: 2.0 standard drinks    Types: 2 Cans of beer per week  . Drug use: Not on file    Home Medications Prior to Admission medications   Medication Sig Start Date End Date Taking? Authorizing Provider  aspirin EC 325 MG tablet Take 1 tablet (325 mg total) by mouth 2 (two) times daily. Patient not taking: Reported on 04/17/2019 11/07/13   Leighton Parody, PA-C  losartan (COZAAR) 50 MG tablet Take 50 mg by mouth daily. 06/30/18   [provider]  meloxicam (MOBIC) 15 MG tablet  08/01/18   [provider]    Allergies    Patient has no known allergies.  Review of Systems   Review of Systems  Cardiovascular: Positive for chest pain.  All other systems reviewed and are negative.   Physical Exam Updated Vital Signs BP (!) 170/85 (BP Location: Left Arm)   Pulse 77   Temp 98.4 F (36.9 C) (Oral)   Resp 18   Ht 6\' 6"  (1.981 m)   Wt 106.6 kg   SpO2 100%   BMI 27.16 kg/m   Physical Exam Vitals and nursing note reviewed.  Constitutional:      General: He is not in acute distress.    Appearance: He is well-developed.  HENT:     Head: Normocephalic and atraumatic.  Eyes:  Conjunctiva/sclera: Conjunctivae normal.     Pupils: Pupils are equal, round, and reactive to light.  Cardiovascular:     Rate and Rhythm: Normal rate and regular rhythm.     Heart sounds: Normal heart sounds.  Pulmonary:     Effort: Pulmonary effort is normal. No respiratory distress.     Breath sounds: Normal breath sounds.  Abdominal:     General: There is no distension.     Palpations: Abdomen is soft.     Tenderness: There is no abdominal tenderness.  Musculoskeletal:        General: No deformity. Normal range of motion.     Cervical back: Normal range of motion and neck supple.  Skin:    General: Skin is warm and dry.  Neurological:     Mental Status: He is alert and oriented to person,  place, and time.     ED Results / Procedures / Treatments   Labs (all labs ordered are listed, but only abnormal results are displayed) Labs Reviewed  BASIC METABOLIC PANEL - Abnormal; Notable for the following components:      Result Value   BUN 30 (*)    All other components within normal limits  TROPONIN I (HIGH SENSITIVITY) - Abnormal; Notable for the following components:   Troponin I (High Sensitivity) 29 (*)    All other components within normal limits  TROPONIN I (HIGH SENSITIVITY) - Abnormal; Notable for the following components:   Troponin I (High Sensitivity) 78 (*)    All other components within normal limits  SARS CORONAVIRUS 2 (TAT 6-24 HRS)  CBC  LIPID PANEL  HEMOGLOBIN A1C    EKG EKG Interpretation  Date/Time:  Saturday April 19 2019 16:15:05 EDT Ventricular Rate:  80 PR Interval:    QRS Duration: 154 QT Interval:  432 QTC Calculation: 499 R Axis:   -75 Text Interpretation: Sinus rhythm RBBB and LAFB ST depression, consider ischemia, diffuse lds Confirmed by Dene Gentry [phone]) on 04/19/2019 4:28:05 PM   Radiology No results found.  Procedures Procedures (including critical care time)  Medications Ordered in ED Medications - No data to display  ED Course  I have reviewed the triage vital signs and the nursing notes.  Pertinent labs & imaging results that were available during my care of the patient were reviewed by me and considered in my medical decision making (see chart for details).    MDM Rules/Calculators/A&P                      MDM  Screen complete  Paul Acosta was evaluated in Emergency Department on 04/19/2019 for the symptoms described in the history of present illness. He was evaluated in the context of the global COVID-19 pandemic, which necessitated consideration that the patient might be at risk for infection with the SARS-CoV-2 virus that causes COVID-19. Institutional protocols and algorithms that pertain to the evaluation  of patients at risk for COVID-19 are in a state of rapid change based on information released by regulatory bodies including the CDC and federal and state organizations. These policies and algorithms were followed during the patient's care in the ED.  Patient is presenting for evaluation of reported chest discomfort.  Patient had an episode of chest discomfort last week associated with exertion.  Patient is reporting recurrent episode of chest discomfort earlier today with minimal exertion.  He is pain-free upon arrival to the ED.  Initial EKG is without evidence of acute ischemia.  Delta  troponin is significant.  Patient will require admission for further cardiac evaluation.  Cardiology service - Dr. Fayne Norrie - is aware of case and agrees with plan to admit patient to [facility] with cardiology consult in the morning.  Hospitalist service is aware of case and will evaluate for admission.     Final Clinical Impression(s) / ED Diagnoses Final diagnoses:  Chest pain, unspecified type    Rx / DC Orders ED Discharge Orders    None       Valarie Merino, MD 04/19/19 2024    Valarie Merino, MD 04/19/19 2025

## 2019-04-19 NOTE — ED Triage Notes (Signed)
Patient states last week he developed chest pain and pcp stated they would schedule a stress test. Patient states he was out eating lunch today and shopping and when he got home the chest pain had returned. Pain rated 5/10.

## 2019-04-20 ENCOUNTER — Encounter (HOSPITAL_COMMUNITY): Payer: Self-pay | Admitting: Family Medicine

## 2019-04-20 DIAGNOSIS — Z7982 Long term (current) use of aspirin: Secondary | ICD-10-CM | POA: Diagnosis not present

## 2019-04-20 DIAGNOSIS — I200 Unstable angina: Secondary | ICD-10-CM | POA: Diagnosis not present

## 2019-04-20 DIAGNOSIS — Z20822 Contact with and (suspected) exposure to covid-19: Secondary | ICD-10-CM | POA: Diagnosis present

## 2019-04-20 DIAGNOSIS — I4510 Unspecified right bundle-branch block: Secondary | ICD-10-CM | POA: Diagnosis present

## 2019-04-20 DIAGNOSIS — R079 Chest pain, unspecified: Secondary | ICD-10-CM | POA: Diagnosis not present

## 2019-04-20 DIAGNOSIS — Z96641 Presence of right artificial hip joint: Secondary | ICD-10-CM | POA: Diagnosis present

## 2019-04-20 DIAGNOSIS — Z8249 Family history of ischemic heart disease and other diseases of the circulatory system: Secondary | ICD-10-CM | POA: Diagnosis not present

## 2019-04-20 DIAGNOSIS — I25119 Atherosclerotic heart disease of native coronary artery with unspecified angina pectoris: Secondary | ICD-10-CM | POA: Diagnosis present

## 2019-04-20 DIAGNOSIS — I214 Non-ST elevation (NSTEMI) myocardial infarction: Secondary | ICD-10-CM | POA: Diagnosis present

## 2019-04-20 DIAGNOSIS — I25110 Atherosclerotic heart disease of native coronary artery with unstable angina pectoris: Secondary | ICD-10-CM | POA: Diagnosis not present

## 2019-04-20 DIAGNOSIS — I10 Essential (primary) hypertension: Secondary | ICD-10-CM | POA: Diagnosis present

## 2019-04-20 DIAGNOSIS — E785 Hyperlipidemia, unspecified: Secondary | ICD-10-CM | POA: Diagnosis not present

## 2019-04-20 DIAGNOSIS — I440 Atrioventricular block, first degree: Secondary | ICD-10-CM | POA: Diagnosis present

## 2019-04-20 DIAGNOSIS — I452 Bifascicular block: Secondary | ICD-10-CM | POA: Diagnosis present

## 2019-04-20 DIAGNOSIS — E782 Mixed hyperlipidemia: Secondary | ICD-10-CM | POA: Diagnosis present

## 2019-04-20 DIAGNOSIS — Z79899 Other long term (current) drug therapy: Secondary | ICD-10-CM | POA: Diagnosis not present

## 2019-04-20 DIAGNOSIS — I2102 ST elevation (STEMI) myocardial infarction involving left anterior descending coronary artery: Secondary | ICD-10-CM | POA: Diagnosis not present

## 2019-04-20 DIAGNOSIS — Z955 Presence of coronary angioplasty implant and graft: Secondary | ICD-10-CM | POA: Diagnosis not present

## 2019-04-20 LAB — PHOSPHORUS: Phosphorus: 3 mg/dL (ref 2.5–4.6)

## 2019-04-20 LAB — BASIC METABOLIC PANEL
Anion gap: 10 (ref 5–15)
Anion gap: 11 (ref 5–15)
BUN: 22 mg/dL (ref 8–23)
BUN: 17 mg/dL (ref 8–23)
CO2: 24 mmol/L (ref 22–32)
CO2: 23 mmol/L (ref 22–32)
Calcium: 8.7 mg/dL — ABNORMAL LOW (ref 8.9–10.3)
Calcium: 8.9 mg/dL (ref 8.9–10.3)
Chloride: 106 mmol/L (ref 98–111)
Chloride: 105 mmol/L (ref 98–111)
Creatinine, Ser: 0.91 mg/dL (ref 0.61–1.24)
Creatinine, Ser: 0.98 mg/dL (ref 0.61–1.24)
GFR calc Af Amer: >60 mL/min (ref >60)
GFR calc Af Amer: >60 mL/min (ref >60)
GFR calc non Af Amer: >60 mL/min (ref >60)
GFR calc non Af Amer: >60 mL/min (ref >60)
Glucose, Bld: 91 mg/dL (ref 70–99)
Glucose, Bld: 102 mg/dL — ABNORMAL HIGH (ref 70–99)
Potassium: 3.5 mmol/L (ref 3.5–5.1)
Potassium: 3.8 mmol/L (ref 3.5–5.1)
Sodium: 140 mmol/L (ref 135–145)
Sodium: 139 mmol/L (ref 135–145)

## 2019-04-20 LAB — CBC
HCT: 44 % (ref 39.0–52.0)
HCT: 41.9 % (ref 39.0–52.0)
Hemoglobin: 14.3 g/dL (ref 13.0–17.0)
Hemoglobin: 14.3 g/dL (ref 13.0–17.0)
MCH: 29.9 pg (ref 26.0–34.0)
MCH: 30.1 pg (ref 26.0–34.0)
MCHC: 32.5 g/dL (ref 30.0–36.0)
MCHC: 34.1 g/dL (ref 30.0–36.0)
MCV: 92.1 fL (ref 80.0–100.0)
MCV: 88.2 fL (ref 80.0–100.0)
Platelets: 188 10*3/uL (ref 150–400)
Platelets: 191 10*3/uL (ref 150–400)
RBC: 4.78 MIL/uL (ref 4.22–5.81)
RBC: 4.75 MIL/uL (ref 4.22–5.81)
RDW: 14.2 % (ref 11.5–15.5)
RDW: 14 % (ref 11.5–15.5)
WBC: 4.5 10*3/uL (ref 4.0–10.5)
WBC: 4.8 10*3/uL (ref 4.0–10.5)
nRBC: 0 % (ref 0.0–0.2)
nRBC: 0 % (ref 0.0–0.2)

## 2019-04-20 LAB — TROPONIN I (HIGH SENSITIVITY)
Troponin I (High Sensitivity): 4039 ng/L (ref <18)
Troponin I (High Sensitivity): 3019 ng/L (ref <18)

## 2019-04-20 LAB — HIV ANTIBODY (ROUTINE TESTING W REFLEX): HIV Screen 4th Generation wRfx: NONREACTIVE

## 2019-04-20 LAB — HEPARIN LEVEL (UNFRACTIONATED): Heparin Unfractionated: 0.69 IU/mL (ref 0.30–0.70)

## 2019-04-20 LAB — SARS CORONAVIRUS 2 (TAT 6-24 HRS): SARS Coronavirus 2: NEGATIVE

## 2019-04-20 LAB — MRSA PCR SCREENING: MRSA by PCR: NEGATIVE

## 2019-04-20 LAB — HEMOGLOBIN A1C
Hgb A1c MFr Bld: 5.2 % (ref 4.8–5.6)
Mean Plasma Glucose: 102.54 mg/dL

## 2019-04-20 LAB — MAGNESIUM: Magnesium: 2.2 mg/dL (ref 1.7–2.4)

## 2019-04-20 MED ORDER — ROSUVASTATIN CALCIUM 20 MG PO TABS
40.0000 mg | ORAL_TABLET | Freq: Every day | ORAL | Status: DC
  Administered 2019-04-21: 40 mg via ORAL
  Filled 2019-04-20 (×2): qty 2

## 2019-04-20 MED ORDER — SODIUM CHLORIDE 0.9 % IV SOLN: 250.0000 mL | INTRAVENOUS | Status: DC | PRN

## 2019-04-20 MED ORDER — NITROGLYCERIN 0.4 MG SL SUBL: 0.4000 mg | SUBLINGUAL_TABLET | SUBLINGUAL | Status: DC | PRN

## 2019-04-20 MED ORDER — ASPIRIN 81 MG PO CHEW
81.0000 mg | CHEWABLE_TABLET | ORAL | Status: AC
  Administered 2019-04-21: 81 mg via ORAL
  Filled 2019-04-20: qty 1

## 2019-04-20 MED ORDER — SODIUM CHLORIDE 0.9% FLUSH
3.0000 mL | Freq: Two times a day (BID) | INTRAVENOUS | Status: DC
  Administered 2019-04-20 – 2019-04-21 (×2): 3 mL via INTRAVENOUS

## 2019-04-20 MED ORDER — SODIUM CHLORIDE 0.9 % WEIGHT BASED INFUSION
3.0000 mL/kg/h | INTRAVENOUS | Status: AC
  Administered 2019-04-21: 3 mL/kg/h via INTRAVENOUS

## 2019-04-20 MED ORDER — SODIUM CHLORIDE 0.9 % WEIGHT BASED INFUSION
1.0000 mL/kg/h | INTRAVENOUS | Status: DC
  Administered 2019-04-21: 1 mL/kg/h via INTRAVENOUS

## 2019-04-20 MED ORDER — HEPARIN BOLUS VIA INFUSION
4000.0000 [IU] | Freq: Once | INTRAVENOUS | Status: AC
  Administered 2019-04-20: 4000 [IU] via INTRAVENOUS
  Filled 2019-04-20: qty 4000

## 2019-04-20 MED ORDER — HEPARIN (PORCINE) 25000 UT/250ML-% IV SOLN
1300.0000 [IU]/h | INTRAVENOUS | Status: DC
  Administered 2019-04-20: 1300 [IU]/h via INTRAVENOUS
  Filled 2019-04-20 (×2): qty 250

## 2019-04-20 MED ORDER — SODIUM CHLORIDE 0.9% FLUSH: 3.0000 mL | INTRAVENOUS | Status: DC | PRN

## 2019-04-20 NOTE — Progress Notes (Signed)
ANTICOAGULATION CONSULT NOTE - Follow Up Consult  Pharmacy Consult for Heparin Indication: chest pain/ACS  No Known Allergies  Patient Measurements: Height: 6\' 6"  (198.1 cm) Weight: 106.6 kg (235 lb) IBW/kg (Calculated) : 91.4 Heparin Dosing Weight: 106.6  Vital Signs: Temp: 98.1 F (36.7 C) (04/04 1237) Temp Source: Oral (04/04 1237) BP: 125/77 (04/04 1237) Pulse Rate: 75 (04/04 0700)  Labs: Recent Labs    04/19/19 1708 04/19/19 1708 04/19/19 1819 04/20/19 0125 04/20/19 0411 04/20/19 0905 04/20/19 1027  HGB 13.8   < >  --   --  14.3  --  14.3  HCT 41.4  --   --   --  44.0  --  41.9  PLT 171  --   --   --  188  --  191  APTT  --   --  32  --   --   --   --   LABPROT  --   --  13.2  --   --   --   --   INR  --   --  1.0  --   --   --   --   HEPARINUNFRC  --   --   --   --   --  0.69  --   CREATININE 1.10  --   --   --  0.91  --  0.98  TROPONINIHS 29*   < > 78* 4,039* 3,019*  --   --    < > = values in this interval not displayed.    Estimated Creatinine Clearance: 94.6 mL/min (by C-G formula based on SCr of 0.98 mg/dL).   Assessment:  Anticoag: ASA PTA, now IV heparin for ACS. HL 0.69 in goal. CBC WNL.  Goal of Therapy:  Heparin level 0.3-0.7 units/ml Monitor platelets by anticoagulation protocol: Yes   Plan:  Cath Monday Con't IV heparin at 1300 units/hr Daiy HL and CBC   Evah Rashid S. [place], PharmD, BCPS Clinical Staff Pharmacist [email or web address] Alford Highland, [place] 04/20/2019,2:21 PM

## 2019-04-20 NOTE — Significant Event (Addendum)
Notified of increased troponin 4039 previous 78. Patient is not having chest pain at this time. Will start Heparin gtt. Cardiology notified who request transfer to [facility] for further evaluation.

## 2019-04-20 NOTE — Progress Notes (Signed)
[facility] notified. Janett Billow, RN at [facility] will be taking pt. Report given to her.

## 2019-04-20 NOTE — Progress Notes (Addendum)
ANTICOAGULATION CONSULT NOTE - Initial Consult  Pharmacy Consult for IV heparin Indication: chest pain/ACS  No Known Allergies  Patient Measurements: Height: 6\' 6"  (198.1 cm) Weight: 106.6 kg (235 lb) IBW/kg (Calculated) : 91.4 Heparin Dosing Weight: actual  Vital Signs: Temp: 97.9 F (36.6 C) (04/04 0219) Temp Source: Oral (04/04 0219) BP: 136/86 (04/04 0219) Pulse Rate: 81 (04/04 0219)  Labs: Recent Labs    04/19/19 1708 04/19/19 1819 04/20/19 0125  HGB 13.8  --   --   HCT 41.4  --   --   PLT 171  --   --   APTT  --  32  --   LABPROT  --  13.2  --   INR  --  1.0  --   CREATININE 1.10  --   --   TROPONINIHS 29* 78* 4,039*    Estimated Creatinine Clearance: 84.2 mL/min (by C-G formula based on SCr of 1.1 mg/dL).   Medical History: Past Medical History:  Diagnosis Date  . Medical history non-contributory     Medications:  Scheduled:  . aspirin EC  81 mg Oral Daily  . heparin  4,000 Units Intravenous Once  . losartan  50 mg Oral Daily   Infusions:  . heparin      Assessment: 14 yoM with CP. Troponins [phone] Patient received Lovenox 40 mg x1 ~ 2300 4/3 Baseline labs: H/H, plts WNL, aptt = 32 and INR = 1  Goal of Therapy:  Heparin level 0.3-0.7 units/ml Monitor platelets by anticoagulation protocol: Yes   Plan:  Heparin 4000 unit IV bolus x1 now Start drip at 1300 units/hr Daily CBC/HL Check 1st HL in 6 hours  Lawana Pai R 04/20/2019,2:50 AM

## 2019-04-20 NOTE — Progress Notes (Signed)
Patient seen and examined.  He is currently chest pain-free.  He had multiple episodes of stuttering chest pain, then a more prolonged episode last night.  He has plans for right heart catheterization tomorrow.  The procedure was explained.  We Paul Acosta get an echo today.  Allegra Lai, MD

## 2019-04-20 NOTE — Progress Notes (Addendum)
PROGRESS NOTE  Paul Acosta [id number] DOB: 1950-07-15 DOA: 04/19/2019 PCP: Maury Dus, MD  Brief summary:  Present to [facility] ED due to chest pain, found to have non-STEMI with elevated troponin in 4000, he is transferred to [facility] for cardiac cath on Monday Cardiology following  HPI/Recap of past 24 hours:  He is on heparin drip, denies pain, vital signs are stable  Assessment/Plan: Principal Problem:   Chest pain Active Problems:   HTN (hypertension)  NSTEMI -Presented with chest pain -Troponin peak at 4000 -LDL 90, HDL 47, triglyceride 166 -Currently on heparin drip, aspirin, losartan, Crestor -echo pending  -N.p.o. after midnight, plan for cardiac cath on Monday -We will follow cardiology recommendation  Hypertension BP stable on home medication Cozaar     DVT Prophylaxis: On heparin drip  Code Status: Full  Family Communication: patient   Disposition Plan:    Patient came from:           Home                                                                                                Anticipated d/c place:  Home  Barriers to d/c OR conditions which need to be met to effect a safe d/c:  Not ready to discharge ,need cardiac cath on Monday, need cardiology clearance   Consultants:  Cardiology  Procedures:  Cardiac cath on Monday  Antibiotics:  None   Objective: BP 124/80 (BP Location: Right Arm)   Pulse 75   Temp 98.7 F (37.1 C) (Oral)   Resp 15   Ht _0  (1.981 m)   Wt 106.6 kg   SpO2 95%   BMI 27.16 kg/m   Intake/Output Summary (Last 24 hours) at 04/20/2019 1025 Last data filed at 04/20/2019 0800 Gross per 24 hour  Intake 343.08 ml  Output --  Net 343.08 ml   Filed Weights   04/19/19 1614  Weight: 106.6 kg    Exam: Patient is examined daily including today on 04/20/2019, exams remain the same as of yesterday except that has changed    General:  NAD  Cardiovascular: RRR  Respiratory: CTABL  Abdomen:  Soft/ND/NT, positive BS  Musculoskeletal: No Edema  Neuro: alert, oriented   Data Reviewed: Basic Metabolic Panel: Recent Labs  Lab 04/19/19 1708 04/19/19 1819 04/20/19 0411  NA 141  --  140  K 4.0  --  3.5  CL 108  --  106  CO2 26  --  24  GLUCOSE 92  --  91  BUN 30*  --  22  CREATININE 1.10  --  0.91  CALCIUM 9.0  --  8.7*  MG  --  2.4 2.2  PHOS  --   --  3.0   Liver Function Tests: No results for input(s): AST, ALT, ALKPHOS, BILITOT, PROT, ALBUMIN in the last 168 hours. No results for input(s): LIPASE, AMYLASE in the last 168 hours. No results for input(s): AMMONIA in the last 168 hours. CBC: Recent Labs  Lab 04/19/19 1708 04/20/19 0411  WBC 4.2 4.5  HGB 13.8 14.3  HCT 41.4 44.0  MCV 90.4 92.1  PLT 171 188   Cardiac Enzymes:   No results for input(s): CKTOTAL, CKMB, CKMBINDEX, TROPONINI in the last 168 hours. BNP (last 3 results) No results for input(s): BNP in the last 8760 hours.  ProBNP (last 3 results) No results for input(s): PROBNP in the last 8760 hours.  CBG: No results for input(s): GLUCAP in the last 168 hours.  Recent Results (from the past 240 hour(s))  SARS CORONAVIRUS 2 (TAT 6-24 HRS) Nasopharyngeal Nasopharyngeal Swab     Status: None   Collection Time: 04/19/19  8:17 PM   Specimen: Nasopharyngeal Swab  Result Value Ref Range Status   SARS Coronavirus 2 NEGATIVE NEGATIVE Final    Comment: (NOTE) SARS-CoV-2 target nucleic acids are NOT DETECTED. The SARS-CoV-2 RNA is generally detectable in upper and lower respiratory specimens during the acute phase of infection. Negative results do not preclude SARS-CoV-2 infection, do not rule out co-infections with other pathogens, and should not be used as the sole basis for treatment or other patient management decisions. Negative results must be combined with clinical observations, patient history, and epidemiological information. The expected result is Negative. Fact Sheet for  Patients: SugarRoll.be Fact Sheet for Healthcare Providers: https://www.woods-mathews.com/ This test is not yet approved or cleared by the [place] FDA and  has been authorized for detection and/or diagnosis of SARS-CoV-2 by FDA under an Emergency Use Authorization (EUA). This EUA will remain  in effect (meaning this test can be used) for the duration of the COVID-19 declaration under Section 56 4(b)(1) of the Act, 21 U.S.C. section 360bbb-3(b)(1), unless the authorization is terminated or revoked sooner. Performed at [facility], [place] [address] [place], [place] [address]   MRSA PCR Screening     Status: None   Collection Time: 04/20/19  4:48 AM   Specimen: Nasal Mucosa; Nasopharyngeal  Result Value Ref Range Status   MRSA by PCR NEGATIVE NEGATIVE Final    Comment:        The GeneXpert MRSA Assay (FDA approved for NASAL specimens only), is one component of a comprehensive MRSA colonization surveillance program. It is not intended to diagnose MRSA infection nor to guide or monitor treatment for MRSA infections. Performed at [facility], [place] [address] [place], [place] [address]      Studies: DG Chest 2 View  Result Date: 04/19/2019 CLINICAL DATA:  Chest pain EXAM: CHEST - 2 VIEW COMPARISON:  October 30, 2013 FINDINGS: The lungs are clear. The heart size and pulmonary vascularity are normal. No adenopathy. No pneumothorax. No bone lesions. IMPRESSION: No abnormality noted. Electronically Signed   By: Lowella Grip III M.D.   On: 04/19/2019 16:54    Scheduled Meds: . aspirin EC  81 mg Oral Daily  . losartan  50 mg Oral Daily  . rosuvastatin  40 mg Oral q1800    Continuous Infusions: . heparin 1,300 Units/hr (04/20/19 0800)     Time spent: 109mns I have personally reviewed and interpreted on  04/20/2019 daily labs, tele strips, imagings as discussed above under date review session and assessment and  plans.  I reviewed all nursing notes, pharmacy notes, consultant notes,  vitals, pertinent old records  I have discussed plan of care as described above with RN , patient and family on 04/20/2019   FFlorencia ReasonsMD, PhD, FACP  [facility]  Available via Epic secure chat 7am-7pm for nonurgent issues Please page for urgent issues, pager number available through  [email or web address] .   04/20/2019, 10:25 AM  LOS: 0 days

## 2019-04-20 NOTE — Progress Notes (Addendum)
Provider notified troponin increased to 4039. MD notified. New order place. Nurse went in to assess pt. Stated he was fine No chest pain, Vitals sign taken, new EKG taken, heparin orders place. Second PIV started. Heparin stated. Staff Nurse and charge nurse present at the time.

## 2019-04-20 NOTE — Consult Note (Signed)
Cardiology Consultation:   Patient ID: Paul Acosta MRN: [id number]; DOB: 1950-07-15  Admit date: 04/19/2019 Date of Consult: 04/20/2019  Primary Care Provider: Maury Dus, MD Primary Cardiologist: No primary care provider on file.  Primary Electrophysiologist:  None    Patient Profile:   Paul Acosta is a 69 y.o. male with a hx of HTN who is being seen today for the evaluation of chest pain.  History of Present Illness:   Paul Acosta was in his usual state of health until last Monday (3/29) when he noticed substernal chest pressure while biking. He slowed down on the bike and was able to complete his full workout. He developed the pain again while biking on 3/30 but it again resolved when he slowed down. On 3/31, he developed more pain while walking around [employer]. He saw his PCP who checked his lab work and ECG and advised him to have a stress test. Paul Acosta took the rest of the week off from working out and continued to feel that something was "off" from time to time but was able to carry on his normal activities. On 4/3, he was walking around [place] with his wife and felt well but upon returning home he noticed more severe chest pain that he radiated a 5/10. He took an extra aspirin and waiting a few minutes and the pain resolved. He presented to [facility] given the recurrence of his pain and was admitted there for monitoring. His troponin rose significantly and he was transferred to [facility] for further evaluation. He states that he was sleeping comfortably and hadn't been having chest pain while at [facility] until they woke up him and told him he had to come to [facility]. He is not sure if this was due to anxiety. He denies any current chest pain since starting the heparin drip. He denies any associated shortness of breath, lightheadedness, dizziness, lower leg swelling, palpitations, orthopnea or PND. He regularly works out 5 times a week. He denies any history of tobacco or drug  use and is a social drinker. His father had a heart attack in his 80s and his brother an MI in his 53s.    Past Medical History:  Diagnosis Date  . Hypertension   . Medical history non-contributory     Past Surgical History:  Procedure Laterality Date  . EYE SURGERY    . TOTAL HIP ARTHROPLASTY Right 11/07/2013   Procedure: RIGHT TOTAL HIP ARTHROPLASTY;  Surgeon: Kerin Salen, MD;  Location: [place];  Service: Orthopedics;  Laterality: Right;     Home Medications:  Prior to Admission medications   Medication Sig Start Date End Date Taking? Authorizing Provider  aspirin EC 81 MG tablet Take 81 mg by mouth daily.   Yes [provider]  Cyanocobalamin (B-12 PO) Take 1 tablet by mouth daily.   Yes [provider]  fluticasone (FLONASE) 50 MCG/ACT nasal spray Place 1 spray into both nostrils daily as needed for allergies or rhinitis.   Yes [provider]  Ginseng 100 MG CAPS Take 100 mg by mouth daily.   Yes [provider]  losartan (COZAAR) 50 MG tablet Take 50 mg by mouth daily. 06/30/18  Yes [provider]  meloxicam (MOBIC) 15 MG tablet  08/01/18  Yes [provider]  Multiple Vitamins-Minerals (ZINC PO) Take 1 tablet by mouth daily.   Yes [provider]  aspirin EC 325 MG tablet Take 1 tablet (325 mg total) by  mouth 2 (two) times daily. Patient not taking: Reported on 04/19/2019 11/07/13   Leighton Parody, PA-C    Inpatient Medications: Scheduled Meds: . aspirin EC  81 mg Oral Daily  . losartan  50 mg Oral Daily  . rosuvastatin  40 mg Oral q1800   Continuous Infusions: . heparin 1,300 Units/hr (04/20/19 0311)   PRN Meds: acetaminophen, nitroGLYCERIN, ondansetron (ZOFRAN) IV  Allergies:   No Known Allergies  Social History:   Social History   Socioeconomic History  . Marital status: Married    Spouse name: Not on file  . Number of children: Not on file  . Years of education: Not on file  . Highest  education level: Not on file  Occupational History  . Not on file  Tobacco Use  . Smoking status: Never Smoker  . Smokeless tobacco: Never Used  Substance and Sexual Activity  . Alcohol use: Yes    Alcohol/week: 2.0 standard drinks    Types: 2 Cans of beer per week  . Drug use: Not on file  . Sexual activity: Not on file  Other Topics Concern  . Not on file  Social History Narrative  . Not on file   Social Determinants of Health   Financial Resource Strain:   . Difficulty of Paying Living Expenses:   Food Insecurity:   . Worried About Charity fundraiser in the Last Year:   . Arboriculturist in the Last Year:   Transportation Needs:   . Film/video editor (Medical):   Marland Kitchen Lack of Transportation (Non-Medical):   Physical Activity:   . Days of Exercise per Week:   . Minutes of Exercise per Session:   Stress:   . Feeling of Stress :   Social Connections:   . Frequency of Communication with Friends and Family:   . Frequency of Social Gatherings with Friends and Family:   . Attends Religious Services:   . Active Member of Clubs or Organizations:   . Attends Archivist Meetings:   Marland Kitchen Marital Status:   Intimate Partner Violence:   . Fear of Current or Ex-Partner:   . Emotionally Abused:   Marland Kitchen Physically Abused:   . Sexually Abused:     Family History:   Father- MI 81s Brother- MI at 57   ROS:  Please see the history of present illness.  All other ROS reviewed and negative.     Physical Exam/Data:   Vitals:   04/19/19 2204 04/20/19 0219 04/20/19 0303 04/20/19 0443  BP: (!) 137/93 136/86 (!) 146/92 139/81  Pulse: 66 81 90 81  Resp: 20 18 18 16   Temp: 97.8 F (36.6 C) 97.9 F (36.6 C)  97.8 F (36.6 C)  TempSrc: Oral Oral  Oral  SpO2: 98% 98% 100% 93%  Weight:      Height:        Intake/Output Summary (Last 24 hours) at 04/20/2019 0538 Last data filed at 04/20/2019 0500 Gross per 24 hour  Intake 64.08 ml  Output --  Net 64.08 ml   Last 3 Weights  04/19/2019 04/17/2019 09/17/2018  Weight (lbs) 235 lb 235 lb 250 lb  Weight (kg) 106.595 kg 106.595 kg 113.399 kg     Body mass index is 27.16 kg/m.  General:  Well nourished, well developed, in no acute distress HEENT: normal Neck: no JVD Vascular: No carotid bruits; FA pulses 2+ bilaterally without bruits  Cardiac:  normal S1, S2; RRR; no murmurrs Lungs:  clear  to auscultation bilaterally, no wheezing, rhonchi or rales  Abd: soft, nontender, no hepatomegaly  Ext: no edema Musculoskeletal:  No deformities, BUE and BLE strength normal and equal Skin: warm and dry  Neuro:  CNs 2-12 intact, no focal abnormalities noted Psych:  Normal affect   EKG:  The EKG was personally reviewed and demonstrates:  LAFB and RBBB. Normal sinus rhythm. ST segment depression Telemetry:  Telemetry was personally reviewed and demonstrates:  Normal sinus rhythm. Rare PVCs  Relevant CV Studies: None  Laboratory Data:  High Sensitivity Troponin:   Recent Labs  Lab 04/19/19 1708 04/19/19 1819 04/20/19 0125 04/20/19 0411  TROPONINIHS 29* 78* 4,039* 3,019*     Chemistry Recent Labs  Lab 04/19/19 1708 04/20/19 0411  NA 141 140  K 4.0 3.5  CL 108 106  CO2 26 24  GLUCOSE 92 91  BUN 30* 22  CREATININE 1.10 0.91  CALCIUM 9.0 8.7*  GFRNONAA >60 >60  GFRAA >60 >60  ANIONGAP 7 10    No results for input(s): PROT, ALBUMIN, AST, ALT, ALKPHOS, BILITOT in the last 168 hours. Hematology Recent Labs  Lab 04/19/19 1708 04/20/19 0411  WBC 4.2 4.5  RBC 4.58 4.78  HGB 13.8 14.3  HCT 41.4 44.0  MCV 90.4 92.1  MCH 30.1 29.9  MCHC 33.3 32.5  RDW 14.1 14.2  PLT 171 188   BNPNo results for input(s): BNP, PROBNP in the last 168 hours.  DDimer No results for input(s): DDIMER in the last 168 hours.   Radiology/Studies:  DG Chest 2 View  Result Date: 04/19/2019 CLINICAL DATA:  Chest pain EXAM: CHEST - 2 VIEW COMPARISON:  October 30, 2013 FINDINGS: The lungs are clear. The heart size and pulmonary  vascularity are normal. No adenopathy. No pneumothorax. No bone lesions. IMPRESSION: No abnormality noted. Electronically Signed   By: Lowella Grip III M.D.   On: 04/19/2019 16:54   TIMI Risk Score for Unstable Angina or Non-ST Elevation MI:   The patient's TIMI risk score is 5, which indicates a 26% risk of all cause mortality, new or recurrent myocardial infarction or need for urgent revascularization in the next 14 days.   Assessment and Plan:   1. NSTEMI- ST depression on ECG with significant rise in troponin. Asymptomatic now. Agree with starting heparin and continuing home aspirin therapy. Will also start him on crestor to improve his cholesterol. Will plan for LHC on 4/5 unless he develops worsening chest pain and we need to activate the lab sooner.  - Heparin ACS nomogram - S/p aspirin 325mg   - Continue aspirin 81mg  - Start crestor 40mg   - NPO at midnight for LHC on 4/5 - Daily ECG - Continue home losartan       For questions or updates, please contact [facility] Please consult [email or web address] for contact info under     Signed, Princella Pellegrini, MD  04/20/2019 5:38 AM

## 2019-04-21 ENCOUNTER — Inpatient Hospital Stay (HOSPITAL_COMMUNITY): Payer: Medicare Other

## 2019-04-21 ENCOUNTER — Inpatient Hospital Stay (HOSPITAL_COMMUNITY)
Admission: EM | Disposition: A | Discharge status: Home / Self Care | Payer: Self-pay | Source: Home / Self Care | Attending: Internal Medicine

## 2019-04-21 DIAGNOSIS — I200 Unstable angina: Secondary | ICD-10-CM

## 2019-04-21 DIAGNOSIS — I2102 ST elevation (STEMI) myocardial infarction involving left anterior descending coronary artery: Secondary | ICD-10-CM

## 2019-04-21 DIAGNOSIS — I10 Essential (primary) hypertension: Secondary | ICD-10-CM

## 2019-04-21 DIAGNOSIS — I25110 Atherosclerotic heart disease of native coronary artery with unstable angina pectoris: Secondary | ICD-10-CM

## 2019-04-21 DIAGNOSIS — I214 Non-ST elevation (NSTEMI) myocardial infarction: Secondary | ICD-10-CM

## 2019-04-21 DIAGNOSIS — E785 Hyperlipidemia, unspecified: Secondary | ICD-10-CM

## 2019-04-21 HISTORY — PX: LEFT HEART CATH AND CORONARY ANGIOGRAPHY: CATH118249

## 2019-04-21 LAB — POCT ACTIVATED CLOTTING TIME
Activated Clotting Time: 312 seconds
Activated Clotting Time: 235 seconds
Activated Clotting Time: 384 seconds

## 2019-04-21 LAB — BASIC METABOLIC PANEL
Anion gap: 8 (ref 5–15)
BUN: 16 mg/dL (ref 8–23)
CO2: 24 mmol/L (ref 22–32)
Calcium: 8.7 mg/dL — ABNORMAL LOW (ref 8.9–10.3)
Chloride: 108 mmol/L (ref 98–111)
Creatinine, Ser: 0.96 mg/dL (ref 0.61–1.24)
GFR calc Af Amer: >60 mL/min (ref >60)
GFR calc non Af Amer: >60 mL/min (ref >60)
Glucose, Bld: 95 mg/dL (ref 70–99)
Potassium: 3.8 mmol/L (ref 3.5–5.1)
Sodium: 140 mmol/L (ref 135–145)

## 2019-04-21 LAB — CBC
HCT: 41.2 % (ref 39.0–52.0)
Hemoglobin: 13.9 g/dL (ref 13.0–17.0)
MCH: 30.3 pg (ref 26.0–34.0)
MCHC: 33.7 g/dL (ref 30.0–36.0)
MCV: 90 fL (ref 80.0–100.0)
Platelets: 184 10*3/uL (ref 150–400)
RBC: 4.58 MIL/uL (ref 4.22–5.81)
RDW: 14.3 % (ref 11.5–15.5)
WBC: 4.7 10*3/uL (ref 4.0–10.5)
nRBC: 0 % (ref 0.0–0.2)

## 2019-04-21 LAB — HEPARIN LEVEL (UNFRACTIONATED): Heparin Unfractionated: 0.54 IU/mL (ref 0.30–0.70)

## 2019-04-21 SURGERY — LEFT HEART CATH AND CORONARY ANGIOGRAPHY
Anesthesia: LOCAL

## 2019-04-21 MED ORDER — NITROGLYCERIN 1 MG/10 ML FOR IR/CATH LAB
INTRA_ARTERIAL | Status: DC | PRN
  Administered 2019-04-21 (×3): 200 ug via INTRACORONARY

## 2019-04-21 MED ORDER — HEPARIN SODIUM (PORCINE) 1000 UNIT/ML IJ SOLN
INTRAMUSCULAR | Status: DC | PRN
  Administered 2019-04-21 (×2): 2000 [IU] via INTRAVENOUS
  Administered 2019-04-21: 6000 [IU] via INTRAVENOUS
  Administered 2019-04-21: 5000 [IU] via INTRAVENOUS

## 2019-04-21 MED ORDER — FENTANYL CITRATE (PF) 100 MCG/2ML IJ SOLN
INTRAMUSCULAR | Status: DC | PRN
  Administered 2019-04-21: 25 ug via INTRAVENOUS
  Administered 2019-04-21: 50 ug via INTRAVENOUS

## 2019-04-21 MED ORDER — VERAPAMIL HCL 2.5 MG/ML IV SOLN
INTRAVENOUS | Status: DC | PRN
  Administered 2019-04-21: 10 mL via INTRA_ARTERIAL

## 2019-04-21 MED ORDER — IOHEXOL 350 MG/ML SOLN
INTRAVENOUS | Status: DC | PRN
  Administered 2019-04-21: 15:00:00 200 mL

## 2019-04-21 MED ORDER — LIDOCAINE HCL (PF) 1 % IJ SOLN
INTRAMUSCULAR | Status: AC
  Filled 2019-04-21: qty 30

## 2019-04-21 MED ORDER — NITROGLYCERIN 1 MG/10 ML FOR IR/CATH LAB
INTRA_ARTERIAL | Status: AC
  Filled 2019-04-21: qty 10

## 2019-04-21 MED ORDER — MIDAZOLAM HCL 2 MG/2ML IJ SOLN
INTRAMUSCULAR | Status: AC
  Filled 2019-04-21: qty 2

## 2019-04-21 MED ORDER — FENTANYL CITRATE (PF) 100 MCG/2ML IJ SOLN
INTRAMUSCULAR | Status: AC
  Filled 2019-04-21: qty 2

## 2019-04-21 MED ORDER — MIDAZOLAM HCL 2 MG/2ML IJ SOLN
INTRAMUSCULAR | Status: DC | PRN
  Administered 2019-04-21: 2 mg via INTRAVENOUS
  Administered 2019-04-21: 1 mg via INTRAVENOUS

## 2019-04-21 MED ORDER — LIDOCAINE HCL (PF) 1 % IJ SOLN
INTRAMUSCULAR | Status: DC | PRN
  Administered 2019-04-21: 2 mL

## 2019-04-21 MED ORDER — VERAPAMIL HCL 2.5 MG/ML IV SOLN
INTRAVENOUS | Status: AC
  Filled 2019-04-21: qty 2

## 2019-04-21 MED ORDER — HEPARIN (PORCINE) IN NACL 1000-0.9 UT/500ML-% IV SOLN
INTRAVENOUS | Status: AC
  Filled 2019-04-21: qty 1000

## 2019-04-21 MED ORDER — HEPARIN SODIUM (PORCINE) 1000 UNIT/ML IJ SOLN
INTRAMUSCULAR | Status: AC
  Filled 2019-04-21: qty 1

## 2019-04-21 MED ORDER — TICAGRELOR 90 MG PO TABS
ORAL_TABLET | ORAL | Status: DC | PRN
  Administered 2019-04-21: 180 mg via ORAL

## 2019-04-21 SURGICAL SUPPLY — 17 items
BALLN SAPPHIRE 2.5X15 (BALLOONS) ×2
BALLN SAPPHIRE ~~LOC~~ 3.25X18 (BALLOONS) ×1 IMPLANT
BALLOON SAPPHIRE 2.5X15 (BALLOONS) IMPLANT
CATH INFINITI JR4 5F (CATHETERS) ×1 IMPLANT
CATH OPTITORQUE TIG 4.0 5F (CATHETERS) ×1 IMPLANT
CATH VISTA GUIDE 6FR XBLAD3.5 (CATHETERS) ×1 IMPLANT
DEVICE RAD COMP TR BAND LRG (VASCULAR PRODUCTS) ×1 IMPLANT
GLIDESHEATH SLEND SS 6F .021 (SHEATH) ×1 IMPLANT
GUIDEWIRE INQWIRE 1.5J.035X260 (WIRE) IMPLANT
INQWIRE 1.5J .035X260CM (WIRE) ×2
KIT ENCORE 26 ADVANTAGE (KITS) ×1 IMPLANT
KIT HEART LEFT (KITS) ×2 IMPLANT
PACK CARDIAC CATHETERIZATION (CUSTOM PROCEDURE TRAY) ×2 IMPLANT
STENT SYNERGY XD 3.0X32 (Permanent Stent) ×1 IMPLANT
TRANSDUCER W/STOPCOCK (MISCELLANEOUS) ×2 IMPLANT
TUBING CIL FLEX 10 FLL-RA (TUBING) ×2 IMPLANT
WIRE COUGAR XT STRL 190CM (WIRE) ×1 IMPLANT

## 2019-04-21 NOTE — Progress Notes (Signed)
Progress Note  Patient Name: Paul Acosta Date of Encounter: 04/21/2019  Primary Cardiologist: No primary care provider on file.   Subjective   No chest pain or shortness of breath  Inpatient Medications    Scheduled Meds: . aspirin EC  81 mg Oral Daily  . losartan  50 mg Oral Daily  . rosuvastatin  40 mg Oral q1800  . sodium chloride flush  3 mL Intravenous Q12H   Continuous Infusions: . sodium chloride    . sodium chloride 1 mL/kg/hr (04/21/19 0525)  . heparin 1,300 Units/hr (04/21/19 0000)   PRN Meds: sodium chloride, acetaminophen, nitroGLYCERIN, ondansetron (ZOFRAN) IV, sodium chloride flush   Vital Signs    Vitals:   04/21/19 0002 04/21/19 0400 04/21/19 0500 04/21/19 0729  BP: 127/84 119/78  119/78  Pulse: 90 70  65  Resp: 20 19  13   Temp: 97.6 F (36.4 C) 97.6 F (36.4 C)  97.8 F (36.6 C)  TempSrc: Oral Oral  Oral  SpO2: 95% 99%  93%  Weight:   107.3 kg   Height:        Intake/Output Summary (Last 24 hours) at 04/21/2019 0916 Last data filed at 04/21/2019 0530 Gross per 24 hour  Intake 1201.6 ml  Output -  Net 1201.6 ml   Last 3 Weights 04/21/2019 04/19/2019 04/17/2019  Weight (lbs) 236 lb 8.9 oz 235 lb 235 lb  Weight (kg) 107.3 kg 106.595 kg 106.595 kg      Telemetry    Sinus rhythm- Personally Reviewed  ECG    Sinus rhythm with right bundle branch block, left axis deviation, first-degree AV block - Personally Reviewed  Physical Exam  Alert, oriented male in no distress GEN: No acute distress.   Neck: No JVD Cardiac: RRR, no murmurs, rubs, or gallops.  Respiratory: Clear to auscultation bilaterally. GI: Soft, nontender, non-distended  MS: No edema; No deformity. Neuro:  Nonfocal  Psych: Normal affect   Labs    High Sensitivity Troponin:   Recent Labs  Lab 04/19/19 1708 04/19/19 1819 04/20/19 0125 04/20/19 0411  TROPONINIHS 29* 78* 4,039* 3,019*      Chemistry Recent Labs  Lab 04/20/19 0411 04/20/19 1027 04/21/19 0118  NA  140 139 140  K 3.5 3.8 3.8  CL 106 105 108  CO2 24 23 24   GLUCOSE 91 102* 95  BUN 22 17 16   CREATININE 0.91 0.98 0.96  CALCIUM 8.7* 8.9 8.7*  GFRNONAA >60 >60 >60  GFRAA >60 >60 >60  ANIONGAP 10 11 8      Hematology Recent Labs  Lab 04/20/19 0411 04/20/19 1027 04/21/19 0118  WBC 4.5 4.8 4.7  RBC 4.78 4.75 4.58  HGB 14.3 14.3 13.9  HCT 44.0 41.9 41.2  MCV 92.1 88.2 90.0  MCH 29.9 30.1 30.3  MCHC 32.5 34.1 33.7  RDW 14.2 14.0 14.3  PLT 188 191 184    BNPNo results for input(s): BNP, PROBNP in the last 168 hours.   DDimer No results for input(s): DDIMER in the last 168 hours.   Radiology    DG Chest 2 View  Result Date: 04/19/2019 CLINICAL DATA:  Chest pain EXAM: CHEST - 2 VIEW COMPARISON:  October 30, 2013 FINDINGS: The lungs are clear. The heart size and pulmonary vascularity are normal. No adenopathy. No pneumothorax. No bone lesions. IMPRESSION: No abnormality noted. Electronically Signed   By: Lowella Grip III M.D.   On: 04/19/2019 16:54    Cardiac Studies   Echo: pending  Patient  Profile     69 y.o. male with NSTEMI, no prior cardiac history  Assessment & Plan    1.  Non-STEMI: Troponin peak at 4000, now trending down.  Continue IV heparin, aspirin, and high intensity statin drug.  I think it is best to avoid a beta-blocker in this patient with right bundle branch block, left axis, and first-degree AV block because of his significant conduction disease and risk of high-grade heart block.  Plan cardiac catheterization plus/minus PCI today.  Risks, indications, alternatives reviewed with the patient.  Echocardiogram currently pending.  AUC: ACS/Class IV/No Rx/No noninvasive testing  2.  Hypertension: Controlled on current therapy (losartan) 3.  Mixed hyperlipidemia, LDL cholesterol 90 mg/dL.  Continue high intensity statin drug which is now been initiated.  Disposition: Pending cardiac catheterization result today  For questions or updates, please  contact [place] Please consult [email or web address] for contact info under   Signed, Sherren Mocha, MD  04/21/2019, 9:16 AM

## 2019-04-21 NOTE — H&P (View-Only) (Signed)
Progress Note  Patient Name: Paul Acosta Date of Encounter: 04/21/2019  Primary Cardiologist: No primary care provider on file.   Subjective   No chest pain or shortness of breath  Inpatient Medications    Scheduled Meds: . aspirin EC  81 mg Oral Daily  . losartan  50 mg Oral Daily  . rosuvastatin  40 mg Oral q1800  . sodium chloride flush  3 mL Intravenous Q12H   Continuous Infusions: . sodium chloride    . sodium chloride 1 mL/kg/hr (04/21/19 0525)  . heparin 1,300 Units/hr (04/21/19 0000)   PRN Meds: sodium chloride, acetaminophen, nitroGLYCERIN, ondansetron (ZOFRAN) IV, sodium chloride flush   Vital Signs    Vitals:   04/21/19 0002 04/21/19 0400 04/21/19 0500 04/21/19 0729  BP: 127/84 119/78  119/78  Pulse: 90 70  65  Resp: 20 19  13   Temp: 97.6 F (36.4 C) 97.6 F (36.4 C)  97.8 F (36.6 C)  TempSrc: Oral Oral  Oral  SpO2: 95% 99%  93%  Weight:   107.3 kg   Height:        Intake/Output Summary (Last 24 hours) at 04/21/2019 0916 Last data filed at 04/21/2019 0530 Gross per 24 hour  Intake 1201.6 ml  Output --  Net 1201.6 ml   Last 3 Weights 04/21/2019 04/19/2019 04/17/2019  Weight (lbs) 236 lb 8.9 oz 235 lb 235 lb  Weight (kg) 107.3 kg 106.595 kg 106.595 kg      Telemetry    Sinus rhythm- Personally Reviewed  ECG    Sinus rhythm with right bundle branch block, left axis deviation, first-degree AV block - Personally Reviewed  Physical Exam  Alert, oriented male in no distress GEN: No acute distress.   Neck: No JVD Cardiac: RRR, no murmurs, rubs, or gallops.  Respiratory: Clear to auscultation bilaterally. GI: Soft, nontender, non-distended  MS: No edema; No deformity. Neuro:  Nonfocal  Psych: Normal affect   Labs    High Sensitivity Troponin:   Recent Labs  Lab 04/19/19 1708 04/19/19 1819 04/20/19 0125 04/20/19 0411  TROPONINIHS 29* 78* 4,039* 3,019*      Chemistry Recent Labs  Lab 04/20/19 0411 04/20/19 1027 04/21/19 0118  NA  140 139 140  K 3.5 3.8 3.8  CL 106 105 108  CO2 24 23 24   GLUCOSE 91 102* 95  BUN 22 17 16   CREATININE 0.91 0.98 0.96  CALCIUM 8.7* 8.9 8.7*  GFRNONAA >60 >60 >60  GFRAA >60 >60 >60  ANIONGAP 10 11 8      Hematology Recent Labs  Lab 04/20/19 0411 04/20/19 1027 04/21/19 0118  WBC 4.5 4.8 4.7  RBC 4.78 4.75 4.58  HGB 14.3 14.3 13.9  HCT 44.0 41.9 41.2  MCV 92.1 88.2 90.0  MCH 29.9 30.1 30.3  MCHC 32.5 34.1 33.7  RDW 14.2 14.0 14.3  PLT 188 191 184    BNPNo results for input(s): BNP, PROBNP in the last 168 hours.   DDimer No results for input(s): DDIMER in the last 168 hours.   Radiology    DG Chest 2 View  Result Date: 04/19/2019 CLINICAL DATA:  Chest pain EXAM: CHEST - 2 VIEW COMPARISON:  October 30, 2013 FINDINGS: The lungs are clear. The heart size and pulmonary vascularity are normal. No adenopathy. No pneumothorax. No bone lesions. IMPRESSION: No abnormality noted. Electronically Signed   By: Lowella Grip III M.D.   On: 04/19/2019 16:54    Cardiac Studies   Echo: pending  Patient  Profile     69 y.o. male with NSTEMI, no prior cardiac history  Assessment & Plan    1.  Non-STEMI: Troponin peak at 4000, now trending down.  Continue IV heparin, aspirin, and high intensity statin drug.  I think it is best to avoid a beta-blocker in this patient with right bundle branch block, left axis, and first-degree AV block because of his significant conduction disease and risk of high-grade heart block.  Plan cardiac catheterization plus/minus PCI today.  Risks, indications, alternatives reviewed with the patient.  Echocardiogram currently pending.  AUC: ACS/Class IV/No Rx/No noninvasive testing  2.  Hypertension: Controlled on current therapy (losartan) 3.  Mixed hyperlipidemia, LDL cholesterol 90 mg/dL.  Continue high intensity statin drug which is now been initiated.  Disposition: Pending cardiac catheterization result today  For questions or updates, please  contact [facility] Please consult [email or web address] for contact info under   Signed, Sherren Mocha, MD  04/21/2019, 9:16 AM

## 2019-04-21 NOTE — Interval H&P Note (Signed)
History and Physical Interval Note:  04/21/2019 1:24 PM  Paul Acosta  has presented today for surgery, with the diagnosis of NSTEMI.  The various methods of treatment have been discussed with the patient and family. After consideration of risks, benefits and other options for treatment, the patient has consented to  Procedure(s): LEFT HEART CATH AND CORONARY ANGIOGRAPHY (N/A) as a surgical intervention.  The patient's history has been reviewed, patient examined, no change in status, stable for surgery.  I have reviewed the patient's chart and labs.  Questions were answered to the patient's satisfaction.   Cath Lab Visit (complete for each Cath Lab visit)  Clinical Evaluation Leading to the Procedure:   ACS: Yes.    Non-ACS:    Anginal Classification: CCS III  Anti-ischemic medical therapy: Minimal Therapy (1 class of medications)  Non-Invasive Test Results: No non-invasive testing performed  Prior CABG: No previous CABG        Shelva Majestic

## 2019-04-21 NOTE — Progress Notes (Signed)
ANTICOAGULATION CONSULT NOTE - Follow Up Consult  Pharmacy Consult for Heparin Indication: chest pain/ACS  No Known Allergies  Patient Measurements: Height: 6\' 6"  (198.1 cm) Weight: 107.3 kg (236 lb 8.9 oz) IBW/kg (Calculated) : 91.4 Heparin Dosing Weight: 106.6  Vital Signs: Temp: 97.6 F (36.4 C) (04/05 0400) Temp Source: Oral (04/05 0400) BP: 119/78 (04/05 0400) Pulse Rate: 70 (04/05 0400)  Labs: Recent Labs    04/19/19 1708 04/19/19 1819 04/20/19 0125 04/20/19 0411 04/20/19 0411 04/20/19 0905 04/20/19 1027 04/21/19 0118  HGB   < >  --   --  14.3   < >  --  14.3 13.9  HCT   < >  --   --  44.0  --   --  41.9 41.2  PLT   < >  --   --  188  --   --  191 184  APTT  --  32  --   --   --   --   --   --   LABPROT  --  13.2  --   --   --   --   --   --   INR  --  1.0  --   --   --   --   --   --   HEPARINUNFRC  --   --   --   --   --  0.69  --  0.54  CREATININE   < >  --   --  0.91  --   --  0.98 0.96  TROPONINIHS  --  78* 4,039* 3,019*  --   --   --   --    < > = values in this interval not displayed.    Estimated Creatinine Clearance: 96.5 mL/min (by C-G formula based on SCr of 0.96 mg/dL).   Assessment:  Anticoag: ASA PTA, now IV heparin for ACS. HL 0.54 at goal. CBC WNL.  No bleeding issues noted.   Goal of Therapy:  Heparin level 0.3-0.7 units/ml Monitor platelets by anticoagulation protocol: Yes   Plan:  Cath Monday Continue IV heparin at 1300 units/hr Daiy HL and CBC  Erin Hearing PharmD., BCPS Clinical Pharmacist 04/21/2019 7:30 AM

## 2019-04-21 NOTE — Progress Notes (Signed)
PROGRESS NOTE  Paul Acosta [id number] DOB: [birth year] DOA: 04/19/2019 PCP: Maury Dus, MD  Brief History   Present to [facility] ED due to chest pain, found to have non-STEMI with elevated troponin in 4000, he is transferred to [facility] for left heart cath on today.  Consultants  . Cardiology  Procedures  . Pending LHC  Antibiotics   Anti-infectives (From admission, onward)   None    .  Subjective  The patient is resting comfortably. No new complaints. He denies any further chest pain overnight.  Objective   Vitals:  Vitals:   04/21/19 1104 04/21/19 1328  BP: 123/70   Pulse: 62   Resp: 19   Temp: 97.9 F (36.6 C)   SpO2:  100%   Exam:  Constitutional:  . The patient is awake, alert, and oriented x 3. No acute distress. Respiratory:  . No increased work of breathing. . No wheezes, rales, or rhonchi . No tactile fremitus Cardiovascular:  . Regular rate and rhythm . No murmurs, ectopy, or gallups. . No lateral PMI. No thrills. Abdomen:  . Abdomen is soft, non-tender, non-distended . No hernias, masses, or organomegaly . Normoactive bowel sounds.  Musculoskeletal:  . No cyanosis, clubbing, or edema Skin:  . No rashes, lesions, ulcers . palpation of skin: no induration or nodules Neurologic:  . CN 2-12 intact . Sensation all 4 extremities intact Psychiatric:  . Mental status o Mood, affect appropriate o Orientation to person, place, time  . judgment and insight appear intact  I have personally reviewed the following:   Today's Data  . Vitals, CBC, BMP  Micro Data  . MRSA by PCR: Negative  Imaging  . CXR  Cardiology Data  . EKG, Sinus rhythm with PAC and RBBB  Scheduled Meds: . [MAR Hold] aspirin EC  81 mg Oral Daily  . [MAR Hold] losartan  50 mg Oral Daily  . [MAR Hold] rosuvastatin  40 mg Oral q1800  . sodium chloride flush  3 mL Intravenous Q12H   Continuous Infusions: . sodium chloride    . sodium chloride 1 mL/kg/hr  (04/21/19 1138)  . heparin 1,300 Units/hr (04/21/19 0745)    Principal Problem:   Chest pain Active Problems:   HTN (hypertension)   NSTEMI (non-ST elevated myocardial infarction) ([place])   LOS: 1 day   A & P   NSTEMI/ACS/LAD infarction: The patient presented with chest pain to [facility] on 4/4/2021due to one week's worth or worsening chest pain with exertion. He had stress test with PCP scheduled for next week as the patient had plans to go to the beach this week. EKG in ED at [facility] demonstrates ST depression, RBBB and LAFB. Initial troponins were 29 and 78. He was given an ASA and admitted for ACS rule out. He was placed on heparin drip, ASA, losartan, and Crestor.  However, later that night his troponin's elevated to 4000. The patient was transferred to [facility] and kept NPO overnight for LHC today. Echocardiogram has demonstrated EF of 55 -60% with normal function. The LV does demonstrate LV wall motion abnormalities. Mid and distal anterior septum, apical anterior segment, and apex are hypokinetic.There is moderate concentric LVH. LV diastolic parameters are indeterminate. RV systolic function is normal. Left atrial size was mildly dilated. Overall, findings were consisted with LAD infarction.   Hypertension: BP stable on home medication Cozaar.  I have seen and examined this patient myself. I have spent 35 minutes in his evaluation and care.  DVT Prophylaxis: On heparin drip Code Status: Full Family Communication: patient  Disposition Plan: Patient is from home. Anticipate discharge to home upon LHC results and release from care by cardiology.  Maija Biggers, DO [facility] Direct contact: see [email or web address]  7PM-7AM contact night coverage as above 04/21/2019, 2:23 PM  LOS: 1 day

## 2019-04-21 NOTE — Progress Notes (Signed)
Ok to dc heparin post cath/PCI per Dr Claiborne Billings.  Onnie Boer, PharmD, BCIDP, AAHIVP, CPP Infectious Disease Pharmacist 04/21/2019 6:07 PM

## 2019-04-21 NOTE — Progress Notes (Signed)
  Echocardiogram 2D Echocardiogram has been performed.  Paul Acosta 04/21/2019, 8:30 AM

## 2019-04-21 NOTE — Plan of Care (Signed)

## 2019-04-22 ENCOUNTER — Telehealth: Payer: Self-pay | Admitting: Cardiovascular Disease

## 2019-04-22 LAB — BASIC METABOLIC PANEL
Anion gap: 11 (ref 5–15)
BUN: 18 mg/dL (ref 8–23)
CO2: 23 mmol/L (ref 22–32)
Calcium: 8.8 mg/dL — ABNORMAL LOW (ref 8.9–10.3)
Chloride: 107 mmol/L (ref 98–111)
Creatinine, Ser: 1.09 mg/dL (ref 0.61–1.24)
GFR calc Af Amer: >60 mL/min (ref >60)
GFR calc non Af Amer: >60 mL/min (ref >60)
Glucose, Bld: 97 mg/dL (ref 70–99)
Potassium: 4.6 mmol/L (ref 3.5–5.1)
Sodium: 141 mmol/L (ref 135–145)

## 2019-04-22 LAB — CBC
HCT: 42.3 % (ref 39.0–52.0)
Hemoglobin: 14.1 g/dL (ref 13.0–17.0)
MCH: 29.8 pg (ref 26.0–34.0)
MCHC: 33.3 g/dL (ref 30.0–36.0)
MCV: 89.4 fL (ref 80.0–100.0)
Platelets: 188 10*3/uL (ref 150–400)
RBC: 4.73 MIL/uL (ref 4.22–5.81)
RDW: 14.1 % (ref 11.5–15.5)
WBC: 4.9 10*3/uL (ref 4.0–10.5)
nRBC: 0 % (ref 0.0–0.2)

## 2019-04-22 MED ORDER — AMLODIPINE BESYLATE 5 MG PO TABS
5.0000 mg | ORAL_TABLET | Freq: Every day | ORAL | Status: DC
  Administered 2019-04-22: 5 mg via ORAL
  Filled 2019-04-22: qty 1

## 2019-04-22 MED ORDER — TICAGRELOR 90 MG PO TABS: 90.0000 mg | ORAL_TABLET | Freq: Two times a day (BID) | ORAL | 0 refills | Status: DC

## 2019-04-22 MED ORDER — TICAGRELOR 90 MG PO TABS
90.0000 mg | ORAL_TABLET | Freq: Two times a day (BID) | ORAL | Status: DC
  Administered 2019-04-22: 90 mg via ORAL
  Filled 2019-04-22: qty 1

## 2019-04-22 MED ORDER — NITROGLYCERIN 0.4 MG SL SUBL: 0.4000 mg | SUBLINGUAL_TABLET | SUBLINGUAL | 0 refills | Status: AC | PRN

## 2019-04-22 MED ORDER — ROSUVASTATIN CALCIUM 40 MG PO TABS: 40.0000 mg | ORAL_TABLET | Freq: Every day | ORAL | 0 refills | Status: DC

## 2019-04-22 MED ORDER — AMLODIPINE BESYLATE 5 MG PO TABS: 5.0000 mg | ORAL_TABLET | Freq: Every day | ORAL | 0 refills | Status: DC

## 2019-04-22 MED FILL — Heparin Sod (Porcine)-NaCl IV Soln 1000 Unit/500ML-0.9%: INTRAVENOUS | Qty: 1000 | Status: AC

## 2019-04-22 NOTE — Telephone Encounter (Signed)
New message   Per Mendel Ryder setup an TOC appt with Vin Bhagat on 05/01/2019 at 2:15 pm.

## 2019-04-22 NOTE — TOC Benefit Eligibility Note (Signed)
Transition of Care [facility]) Benefit Eligibility Note    Patient Details  Name: Paul Acosta MRN: [id number] Date of Birth: [birth year]   Medication/Dose: Kary Kos  90 MG BID  Covered?: Yes  Tier: 3 Drug  Prescription Coverage Preferred Pharmacy: [facility] with Person/Company/Phone Number:: JULIAN @  [employer] # [phone] OPT- 2  Co-Pay: $377.00  Prior Approval: No  Deductible: Unmet       Memory Argue Phone Number: 04/22/2019, 10:11 AM

## 2019-04-22 NOTE — Progress Notes (Signed)
Progress Note  Patient Name: Paul Acosta Date of Encounter: 04/22/2019  Primary Cardiologist: No primary care provider on file.   Subjective   No chest pain or dyspnea this morning  Inpatient Medications    Scheduled Meds:  amLODipine  5 mg Oral Daily   aspirin EC  81 mg Oral Daily   losartan  50 mg Oral Daily   rosuvastatin  40 mg Oral q1800   ticagrelor  90 mg Oral BID   Continuous Infusions:  PRN Meds: acetaminophen, nitroGLYCERIN, ondansetron (ZOFRAN) IV   Vital Signs    Vitals:   04/21/19 2341 04/22/19 0233 04/22/19 0730 04/22/19 0741  BP: 104/63 110/65 133/75 133/76  Pulse: 67 62 73   Resp: 15 19 18    Temp: 97.8 F (36.6 C) 97.8 F (36.6 C) 97.8 F (36.6 C)   TempSrc: Oral Oral Oral   SpO2: 99% 97% 98%   Weight:      Height:        Intake/Output Summary (Last 24 hours) at 04/22/2019 0913 Last data filed at 04/22/2019 0741 Gross per 24 hour  Intake 1182 ml  Output 575 ml  Net 607 ml   Last 3 Weights 04/21/2019 04/19/2019 04/17/2019  Weight (lbs) 236 lb 8.9 oz 235 lb 235 lb  Weight (kg) 107.3 kg 106.595 kg 106.595 kg      Telemetry    Sinus rhythm- Personally Reviewed  ECG    Normal sinus rhythm with right bundle branch block, first-degree AV block, marked ST/T wave abnormality consider anterolateral ischemia - Personally Reviewed  Physical Exam  Alert, oriented male in no distress GEN: No acute distress.   Neck: No JVD Cardiac: RRR, no murmurs, rubs, or gallops.  Respiratory: Clear to auscultation bilaterally. GI: Soft, nontender, non-distended  MS: No edema; No deformity. Neuro:  Nonfocal  Psych: Normal affect   Labs    High Sensitivity Troponin:   Recent Labs  Lab 04/19/19 1708 04/19/19 1819 04/20/19 0125 04/20/19 0411  TROPONINIHS 29* 78* 4,039* 3,019*      Chemistry Recent Labs  Lab 04/20/19 1027 04/21/19 0118 04/22/19 0228  NA 139 140 141  K 3.8 3.8 4.6  CL 105 108 107  CO2 23 24 23   GLUCOSE 102* 95 97  BUN 17 16  18   CREATININE 0.98 0.96 1.09  CALCIUM 8.9 8.7* 8.8*  GFRNONAA >60 >60 >60  GFRAA >60 >60 >60  ANIONGAP 11 8 11      Hematology Recent Labs  Lab 04/20/19 1027 04/21/19 0118 04/22/19 0228  WBC 4.8 4.7 4.9  RBC 4.75 4.58 4.73  HGB 14.3 13.9 14.1  HCT 41.9 41.2 42.3  MCV 88.2 90.0 89.4  MCH 30.1 30.3 29.8  MCHC 34.1 33.7 33.3  RDW 14.0 14.3 14.1  PLT 191 184 188    BNPNo results for input(s): BNP, PROBNP in the last 168 hours.   DDimer No results for input(s): DDIMER in the last 168 hours.   Radiology    CARDIAC CATHETERIZATION  Result Date: 04/21/2019  Prox LAD lesion is 95% stenosed.  Mid LAD-1 lesion is 80% stenosed.  Mid LAD-2 lesion is 30% stenosed.  Ost Cx lesion is 20% stenosed.  Prox Cx to Mid Cx lesion is 40% stenosed.  Mid Cx to Dist Cx lesion is 20% stenosed.  Prox RCA to Mid RCA lesion is 20% stenosed.  A stent was successfully placed.  Post intervention, there is a 0% residual stenosis.  Post intervention, there is a 0% residual stenosis.  Multivessel CAD with 95% proximal LAD stenosis followed by 80% stenosis after the first septal perforating artery and 30% mid LAD stenosis in a large LAD vessel which wraps around the LV apex; 20% smooth ostial stenosis of the circumflex coronary artery followed by 40% somewhat eccentric proximal stenosis and 20% mid stenosis; and dominant RCA with 20% mid stenosis. Successful percutaneous coronary intervention to the proximal to mid LAD lesions with ultimate insertion of a 3.0 x 32 mm Synergy DES stent postdilated to 3.25 mm with the 95 and 80% stenoses being reduced to 0%.  There is brisk TIMI-3 flow and no evidence for dissection. LVEDP 31 mm Hg. RECOMMENDATION: DAPT therapy for minimum of 12 months.  Medical therapy for mild concomitant CAD with optimal blood pressure lowering with target less than 130/80 and ideal less than 120/80, aggressive lipid-lowering therapy with target LDL less than 70.   ECHOCARDIOGRAM COMPLETE  BUBBLE STUDY  Result Date: 04/21/2019    ECHOCARDIOGRAM REPORT   Patient Name:   Paul Acosta Date of Exam: 04/21/2019 Medical Rec #:  [id number]     Height:       78.0 in Accession #:    [id number]    Weight:       236.6 lb Date of Birth:  September 21, 1950     BSA:          2.424 m Patient Age:    69 years      BP:           119/78 mmHg Patient Gender: M             HR:           65 bpm. Exam Location:  Inpatient Procedure: 2D Echo Indications:    NSTEMI  History:        Patient has no prior history of Echocardiogram examinations.                 Risk Factors:Hypertension.  Sonographer:    Jannett Celestine RDCS (AE) Referring Phys: [id number] WILL MARTIN [place]  1. Left ventricular ejection fraction, by estimation, is 55 to 60%. The left ventricle has normal function. The left ventricle demonstrates regional wall motion abnormalities (see scoring diagram/findings for description). There is moderate concentric left ventricular hypertrophy. Left ventricular diastolic parameters are indeterminate.  2. Right ventricular systolic function is normal. The right ventricular size is normal. Tricuspid regurgitation signal is inadequate for assessing PA pressure.  3. Left atrial size was mildly dilated.  4. The mitral valve is grossly normal. Mild mitral valve regurgitation. No evidence of mitral stenosis.  5. The aortic valve is tricuspid. Aortic valve regurgitation is not visualized. No aortic stenosis is present.  6. The inferior vena cava is normal in size with greater than 50% respiratory variability, suggesting right atrial pressure of 3 mmHg. Conclusion(s)/Recommendation(s): Findings consistent with LAD infarction. FINDINGS  Left Ventricle: Left ventricular ejection fraction, by estimation, is 55 to 60%. The left ventricle has normal function. The left ventricle demonstrates regional wall motion abnormalities. The left ventricular internal cavity size was normal in size. There is moderate concentric left ventricular  hypertrophy. Left ventricular diastolic parameters are indeterminate.  LV Wall Scoring: The mid and distal anterior septum, apical anterior segment, and apex are hypokinetic. Right Ventricle: The right ventricular size is normal. No increase in right ventricular wall thickness. Right ventricular systolic function is normal. Tricuspid regurgitation signal is inadequate for assessing PA pressure. Left Atrium: Left atrial size was mildly dilated. Right  Atrium: Right atrial size was normal in size. Pericardium: Trivial pericardial effusion is present. Presence of pericardial fat pad. Mitral Valve: The mitral valve is grossly normal. Mild mitral valve regurgitation. No evidence of mitral valve stenosis. Tricuspid Valve: The tricuspid valve is grossly normal. Tricuspid valve regurgitation is trivial. No evidence of tricuspid stenosis. Aortic Valve: The aortic valve is tricuspid. Aortic valve regurgitation is not visualized. No aortic stenosis is present. Pulmonic Valve: The pulmonic valve was grossly normal. Pulmonic valve regurgitation is not visualized. No evidence of pulmonic stenosis. Aorta: The aortic root and ascending aorta are structurally normal, with no evidence of dilitation. Venous: The inferior vena cava is normal in size with greater than 50% respiratory variability, suggesting right atrial pressure of 3 mmHg. IAS/Shunts: The atrial septum is grossly normal.  LEFT VENTRICLE PLAX 2D LVIDd:         5.10 cm  Diastology LVIDs:         3.20 cm  LV e' lateral:   8.38 cm/s LV PW:         1.30 cm  LV E/e' lateral: 8.2 LV IVS:        1.30 cm  LV e' medial:    6.64 cm/s LVOT diam:     2.40 cm  LV E/e' medial:  10.3 LV SV:         90 LV SV Index:   37 LVOT Area:     4.52 cm  RIGHT VENTRICLE TAPSE (M-mode): 2.1 cm LEFT ATRIUM              Index       RIGHT ATRIUM           Index LA diam:        4.40 cm  1.81 cm/m  RA Area:     15.50 cm LA Vol (A2C):   105.0 ml 43.31 ml/m RA Volume:   38.70 ml  15.96 ml/m LA Vol  (A4C):   44.7 ml  18.44 ml/m LA Biplane Vol: 75.6 ml  31.18 ml/m  AORTIC VALVE LVOT Vmax:   85.50 cm/s LVOT Vmean:  60.400 cm/s LVOT VTI:    0.200 m  AORTA Ao Root diam: 3.80 cm MITRAL VALVE MV Area (PHT): 3.23 cm    SHUNTS MV Decel Time: 235 msec    Systemic VTI:  0.20 m MV E velocity: 68.60 cm/s  Systemic Diam: 2.40 cm MV A velocity: 51.00 cm/s MV E/A ratio:  1.35 Eleonore Chiquito MD Electronically signed by Eleonore Chiquito MD Signature Date/Time: 04/21/2019/10:52:16 AM    Final     Cardiac Studies   Cardiac catheterization and echo studies personally reviewed as outlined above  Patient Profile     69 y.o. male presenting with non-STEMI, found to have severe proximal LAD stenosis treated with PCI using a drug-eluting stent  Assessment & Plan    1.  Non-STEMI: Patient underwent uncomplicated PCI the proximal LAD with a single drug-eluting stent.  He will remain on dual antiplatelet therapy with aspirin and ticagrelor for at least 12 months.  Aggressive medical therapy for residual CAD.  He does have diffuse nonobstructive coronary disease noted. 2.  Mixed hyperlipidemia: Now on a high intensity statin drug with rosuvastatin 40 mg.  Goal LDL cholesterol less than 70 mg/dL. 3.  Hypertension: Treated with losartan and amlodipine.  Would avoid beta blockade with conduction disease noted (right bundle branch block, left axis, first-degree AV block).   Disposition: Stable for discharge today.  Discussed post MI  restrictions, medical therapy, and follow-up plans at length with the patient this morning.  For questions or updates, please contact [facility] Please consult [email or web address] for contact info under     Signed, Sherren Mocha, MD  04/22/2019, 9:13 AM

## 2019-04-22 NOTE — Progress Notes (Signed)
Received call from RN as we did not get order from cath lab. Order placed for CRPI. Pt has been ambulating and feels well. Very eager to d/c. Bried ed of MI, stent, Brilinta, restrictions, NTG, and CRPII. Pt accepted diet and exercise guidelines. He plans to begin a light version of his normal exercise next week. To walk this week. Not interested in CRPII but will refer to [place]. He voiced understanding importance of Brilinta. [place], ACSM 9:45 AM 04/22/2019

## 2019-04-22 NOTE — TOC Progression Note (Addendum)
Transition of Care [facility]) - Progression Note    Patient Details  Name: Paul Acosta MRN: [id number] Date of Birth: 02-Jun-1950  Transition of Care [facility] CM/SW Contact  Zenon Mayo, RN Phone Number: 04/22/2019, 9:24 AM  Clinical Narrative:    Patient is for dc today, will be on brilinta, NCM spoke with pharmacist at [facility] she states the price of the brilinta is 377.00 due to deductible not being met.  NCM will inform patient and give him the 30 day coupon and inform MD also.  Patient states he is ok with the 377.00 for the refills til the deductible is met for the brilinta.        Expected Discharge Plan and Services           Expected Discharge Date: 04/22/19                                     Social Determinants of Health (SDOH) Interventions    Readmission Risk Interventions No flowsheet data found.

## 2019-04-23 NOTE — Discharge Summary (Signed)
Physician Discharge Summary  Paul Acosta [id number] DOB: February 26, 1950 DOA: 04/19/2019  PCP: Maury Dus, MD  Admit date: 04/19/2019 Discharge date: 04/23/2019  Recommendations for Outpatient Follow-up:  1. Discharge to home. 2. Follow up with PCP in 7-10 days after discharge 3. Follow up with cardiology as directed. 4. Follow up with cardiac rehab as directed.  Follow-up Information    [place], Crista Luria, [place] Follow up on 05/01/2019.   Specialty: Cardiology Why: at 2:15pm for your follow up appt.  Contact information: [place] [address] [phone]           Discharge Diagnoses: Principal diagnosis is #1 1. NSTEMI/LAD infarction 2. Hypertension 3. Chest pain 4. S/P DES to Proximal LAD  Discharge Condition: Fair  Disposition: Home  Diet recommendation: Heart healthy  Filed Weights   04/19/19 1614 04/21/19 0500  Weight: 106.6 kg 107.3 kg    History of present illness:  Paul Acosta is a 69 y.o. male with PMH of HTN presented to ER for chest pain and admitted for ACS rule-out. Patient reports that for the past week he has noted progressive worsening of chest pain with exertion. First noted last week when exercising but was able to complete his bicycling at a slower pace. Chest pain is located centrally, sub-sternal and does not radiate. Denies diaphoresis, worsening with inspiration, burning-like pain. It is worsened by movement and relieved quickly with resting. He continued to notice this pain intermittently throughout the week and contacted PCP who ordered a stress test which is scheduled for next week as they are going to the beach this week. Today, he went to [employer] with his girlfriend and walked around and felt fine. They went to eat lunch and then he was helping unload a few groceries at home and again noted the pain only worse than prior and decided to come to the ER. No chest pain currently but he has felt an "odd sensation" when he  walked to the bathroom which quickly went away when he sat back in bed. He has never smoked tobacco. In the summers he does drink > 12 beers weekly but otherwise only a few drinks monthly. Denies headache, dizziness, fever, chills, cough, SOB, abdominal pain, nausea, vomiting, diarrhea, constipation, dysuria, hematuria, hematochezia, melena, difficulty moving arms/legs, speech difficulty, trouble eating, confusion or any other complaints.  In the ED: Mild hypertension otherwise vitals stable. EKG with RBBB, LAFB and ST depression (only EKG from 2015 as comparison). Labs remarkable for CBC and BMP WNL. Troponin 29>78. CXR without acute cardiopulmonary abnormality. Patient given Aspirin and admission requested for ACS rule-out. EDP spoke with on-call Cardiology fellow who reviewed chart and reported patient could stay at [facility] will follow.   Hospital Course:  The patient was transferred to [facility] to a telemetry bed. Cardiology was consulted. He was given ASA and a heparin drip, losartan, and crestor. His Troponin increased to 4000. He had an echocardiogram that demonstrated: Echocardiogram has demonstrated EF of 55 -60% with normal function. The LV does demonstrate LV wall motion abnormalities. Mid and distal anterior septum, apical anterior segment, and apex are hypokinetic.There is moderate concentric LVH. LV diastolic parameters are indeterminate. RV systolic function is normal. Left atrial size was mildly dilated. Overall, findings were consisted with LAD infarction.   The patient underwent LHC on 04/21/2019. He had uncomplicated PCI to the proximal LAD with a single drug-eluting stent. He has diffuse non-obstructive coronary disease that will be addressed with aggressive  medical therapy. He will be on DAPT with ASA and ticagrelor for at least 12 months.  His goal LDL is less than 70 mg/dL. His hypertension is treated with losartan and amlodipine. Per cardiology Beta Blockers are to be avoided  due to the patient's conduction disease (RBBB, left axis, and first degree block).  The patient has been cleared for discharge to home.  Today's assessment: S: The patient is sitting up in a chair dressed and prepared for discharge. O: Vitals:  Vitals:   04/22/19 0730 04/22/19 0741  BP: 133/75 133/76  Pulse: 73   Resp: 18   Temp: 97.8 F (36.6 C)   SpO2: 98%    Exam:  Constitutional:  . The patient is awake, alert, and oriented x 3. No acute distress. Respiratory:  . No increased work of breathing. . No wheezes, rales, or rhonchi . No tactile fremitus Cardiovascular:  . Regular rate and rhythm . No murmurs, ectopy, or gallups. . No lateral PMI. No thrills. Abdomen:  . Abdomen is soft, non-tender, non-distended . No hernias, masses, or organomegaly . Normoactive bowel sounds.  Musculoskeletal:  . No cyanosis, clubbing, or edema Skin:  . No rashes, lesions, ulcers . palpation of skin: no induration or nodules Neurologic:  . CN 2-12 intact . Sensation all 4 extremities intact Psychiatric:  . Mental status o Mood, affect appropriate o Orientation to person, place, time  . judgment and insight appear intact   Discharge Instructions  Discharge Instructions    Activity as tolerated - No restrictions   Complete by: As directed    Amb Referral to Cardiac Rehabilitation   Complete by: As directed    Diagnosis:  Coronary Stents NSTEMI PTCA     After initial evaluation and assessments completed: Virtual Based Care may be provided alone or in conjunction with Phase 2 Cardiac Rehab based on patient barriers.: Yes   Call MD for:  difficulty breathing, headache or visual disturbances   Complete by: As directed    Call MD for:  persistant dizziness or light-headedness   Complete by: As directed    Call MD for:  severe uncontrolled pain   Complete by: As directed    Call MD for:  temperature >100.4   Complete by: As directed    Diet - low sodium heart healthy    Complete by: As directed    Discharge instructions   Complete by: As directed    Discharge to home. Follow up with PCP in 7-10 days after discharge Follow up with cardiology as directed. Follow up with cardiac rehab as directed.   Increase activity slowly   Complete by: As directed      Allergies as of 04/22/2019   No Known Allergies     Medication List    STOP taking these medications   Ginseng 100 MG Caps   meloxicam 15 MG tablet Commonly known as: MOBIC     TAKE these medications   amLODipine 5 MG tablet Commonly known as: NORVASC Take 1 tablet (5 mg total) by mouth daily.   aspirin EC 81 MG tablet Take 81 mg by mouth daily. What changed: Another medication with the same name was removed. Continue taking this medication, and follow the directions you see here.   B-12 PO Take 1 tablet by mouth daily.   fluticasone 50 MCG/ACT nasal spray Commonly known as: FLONASE Place 1 spray into both nostrils daily as needed for allergies or rhinitis.   losartan 50 MG tablet Commonly known as:  COZAAR Take 50 mg by mouth daily.   nitroGLYCERIN 0.4 MG SL tablet Commonly known as: NITROSTAT Place 1 tablet (0.4 mg total) under the tongue every 5 (five) minutes as needed for chest pain.   rosuvastatin 40 MG tablet Commonly known as: CRESTOR Take 1 tablet (40 mg total) by mouth daily at 6 PM.   ticagrelor 90 MG Tabs tablet Commonly known as: BRILINTA Take 1 tablet (90 mg total) by mouth 2 (two) times daily.   ZINC PO Take 1 tablet by mouth daily.      No Known Allergies  The results of significant diagnostics from this hospitalization (including imaging, microbiology, ancillary and laboratory) are listed below for reference.    Significant Diagnostic Studies: DG Chest 2 View  Result Date: 04/19/2019 CLINICAL DATA:  Chest pain EXAM: CHEST - 2 VIEW COMPARISON:  October 30, 2013 FINDINGS: The lungs are clear. The heart size and pulmonary vascularity are normal. No  adenopathy. No pneumothorax. No bone lesions. IMPRESSION: No abnormality noted. Electronically Signed   By: Lowella Grip III M.D.   On: 04/19/2019 16:54   CARDIAC CATHETERIZATION  Result Date: 04/21/2019  Prox LAD lesion is 95% stenosed.  Mid LAD-1 lesion is 80% stenosed.  Mid LAD-2 lesion is 30% stenosed.  Ost Cx lesion is 20% stenosed.  Prox Cx to Mid Cx lesion is 40% stenosed.  Mid Cx to Dist Cx lesion is 20% stenosed.  Prox RCA to Mid RCA lesion is 20% stenosed.  A stent was successfully placed.  Post intervention, there is a 0% residual stenosis.  Post intervention, there is a 0% residual stenosis.  Multivessel CAD with 95% proximal LAD stenosis followed by 80% stenosis after the first septal perforating artery and 30% mid LAD stenosis in a large LAD vessel which wraps around the LV apex; 20% smooth ostial stenosis of the circumflex coronary artery followed by 40% somewhat eccentric proximal stenosis and 20% mid stenosis; and dominant RCA with 20% mid stenosis. Successful percutaneous coronary intervention to the proximal to mid LAD lesions with ultimate insertion of a 3.0 x 32 mm Synergy DES stent postdilated to 3.25 mm with the 95 and 80% stenoses being reduced to 0%.  There is brisk TIMI-3 flow and no evidence for dissection. LVEDP 31 mm Hg. RECOMMENDATION: DAPT therapy for minimum of 12 months.  Medical therapy for mild concomitant CAD with optimal blood pressure lowering with target less than 130/80 and ideal less than 120/80, aggressive lipid-lowering therapy with target LDL less than 70.   ECHOCARDIOGRAM COMPLETE BUBBLE STUDY  Result Date: 04/21/2019    ECHOCARDIOGRAM REPORT   Patient Name:   Paul Acosta [facility] Date of Exam: 04/21/2019 Medical Rec #:  [id number]     Height:       78.0 in Accession #:    [id number]    Weight:       236.6 lb Date of Birth:  [birth year]     BSA:          2.424 m Patient Age:    89 years      BP:           119/78 mmHg Patient Gender: M             HR:            65 bpm. Exam Location:  Inpatient Procedure: 2D Echo Indications:    NSTEMI  History:        Patient has no prior history of Echocardiogram examinations.  Risk Factors:Hypertension.  Sonographer:    Jannett Celestine RDCS (AE) Referring Phys: [id number] WILL MARTIN [place]  1. Left ventricular ejection fraction, by estimation, is 55 to 60%. The left ventricle has normal function. The left ventricle demonstrates regional wall motion abnormalities (see scoring diagram/findings for description). There is moderate concentric left ventricular hypertrophy. Left ventricular diastolic parameters are indeterminate.  2. Right ventricular systolic function is normal. The right ventricular size is normal. Tricuspid regurgitation signal is inadequate for assessing PA pressure.  3. Left atrial size was mildly dilated.  4. The mitral valve is grossly normal. Mild mitral valve regurgitation. No evidence of mitral stenosis.  5. The aortic valve is tricuspid. Aortic valve regurgitation is not visualized. No aortic stenosis is present.  6. The inferior vena cava is normal in size with greater than 50% respiratory variability, suggesting right atrial pressure of 3 mmHg. Conclusion(s)/Recommendation(s): Findings consistent with LAD infarction. FINDINGS  Left Ventricle: Left ventricular ejection fraction, by estimation, is 55 to 60%. The left ventricle has normal function. The left ventricle demonstrates regional wall motion abnormalities. The left ventricular internal cavity size was normal in size. There is moderate concentric left ventricular hypertrophy. Left ventricular diastolic parameters are indeterminate.  LV Wall Scoring: The mid and distal anterior septum, apical anterior segment, and apex are hypokinetic. Right Ventricle: The right ventricular size is normal. No increase in right ventricular wall thickness. Right ventricular systolic function is normal. Tricuspid regurgitation signal is inadequate for  assessing PA pressure. Left Atrium: Left atrial size was mildly dilated. Right Atrium: Right atrial size was normal in size. Pericardium: Trivial pericardial effusion is present. Presence of pericardial fat pad. Mitral Valve: The mitral valve is grossly normal. Mild mitral valve regurgitation. No evidence of mitral valve stenosis. Tricuspid Valve: The tricuspid valve is grossly normal. Tricuspid valve regurgitation is trivial. No evidence of tricuspid stenosis. Aortic Valve: The aortic valve is tricuspid. Aortic valve regurgitation is not visualized. No aortic stenosis is present. Pulmonic Valve: The pulmonic valve was grossly normal. Pulmonic valve regurgitation is not visualized. No evidence of pulmonic stenosis. Aorta: The aortic root and ascending aorta are structurally normal, with no evidence of dilitation. Venous: The inferior vena cava is normal in size with greater than 50% respiratory variability, suggesting right atrial pressure of 3 mmHg. IAS/Shunts: The atrial septum is grossly normal.  LEFT VENTRICLE PLAX 2D LVIDd:         5.10 cm  Diastology LVIDs:         3.20 cm  LV e' lateral:   8.38 cm/s LV PW:         1.30 cm  LV E/e' lateral: 8.2 LV IVS:        1.30 cm  LV e' medial:    6.64 cm/s LVOT diam:     2.40 cm  LV E/e' medial:  10.3 LV SV:         90 LV SV Index:   37 LVOT Area:     4.52 cm  RIGHT VENTRICLE TAPSE (M-mode): 2.1 cm LEFT ATRIUM              Index       RIGHT ATRIUM           Index LA diam:        4.40 cm  1.81 cm/m  RA Area:     15.50 cm LA Vol (A2C):   105.0 ml 43.31 ml/m RA Volume:   38.70 ml  15.96 ml/m LA Vol (A4C):  44.7 ml  18.44 ml/m LA Biplane Vol: 75.6 ml  31.18 ml/m  AORTIC VALVE LVOT Vmax:   85.50 cm/s LVOT Vmean:  60.400 cm/s LVOT VTI:    0.200 m  AORTA Ao Root diam: 3.80 cm MITRAL VALVE MV Area (PHT): 3.23 cm    SHUNTS MV Decel Time: 235 msec    Systemic VTI:  0.20 m MV E velocity: 68.60 cm/s  Systemic Diam: 2.40 cm MV A velocity: 51.00 cm/s MV E/A ratio:  1.35 Eleonore Chiquito MD Electronically signed by Eleonore Chiquito MD Signature Date/Time: 04/21/2019/10:52:16 AM    Final     Microbiology: Recent Results (from the past 240 hour(s))  SARS CORONAVIRUS 2 (TAT 6-24 HRS) Nasopharyngeal Nasopharyngeal Swab     Status: None   Collection Time: 04/19/19  8:17 PM   Specimen: Nasopharyngeal Swab  Result Value Ref Range Status   SARS Coronavirus 2 NEGATIVE NEGATIVE Final    Comment: (NOTE) SARS-CoV-2 target nucleic acids are NOT DETECTED. The SARS-CoV-2 RNA is generally detectable in upper and lower respiratory specimens during the acute phase of infection. Negative results do not preclude SARS-CoV-2 infection, do not rule out co-infections with other pathogens, and should not be used as the sole basis for treatment or other patient management decisions. Negative results must be combined with clinical observations, patient history, and epidemiological information. The expected result is Negative. Fact Sheet for Patients: SugarRoll.be Fact Sheet for Healthcare Providers: https://www.woods-mathews.com/ This test is not yet approved or cleared by the [place] FDA and  has been authorized for detection and/or diagnosis of SARS-CoV-2 by FDA under an Emergency Use Authorization (EUA). This EUA will remain  in effect (meaning this test can be used) for the duration of the COVID-19 declaration under Section 56 4(b)(1) of the Act, 21 U.S.C. section 360bbb-3(b)(1), unless the authorization is terminated or revoked sooner. Performed at [facility], [place] [address] [place], [place] [address]   MRSA PCR Screening     Status: None   Collection Time: 04/20/19  4:48 AM   Specimen: Nasal Mucosa; Nasopharyngeal  Result Value Ref Range Status   MRSA by PCR NEGATIVE NEGATIVE Final    Comment:        The GeneXpert MRSA Assay (FDA approved for NASAL specimens only), is one component of a comprehensive MRSA  colonization surveillance program. It is not intended to diagnose MRSA infection nor to guide or monitor treatment for MRSA infections. Performed at [facility], [place] [address], [place], [place] [address]      Labs: Basic Metabolic Panel: Recent Labs  Lab 04/19/19 1708 04/19/19 1819 04/20/19 0411 04/20/19 1027 04/21/19 0118 04/22/19 0228  NA 141  --  140 139 140 141  K 4.0  --  3.5 3.8 3.8 4.6  CL 108  --  106 105 108 107  CO2 26  --  24 23 24 23   GLUCOSE 92  --  91 102* 95 97  BUN 30*  --  22 17 16 18   CREATININE 1.10  --  0.91 0.98 0.96 1.09  CALCIUM 9.0  --  8.7* 8.9 8.7* 8.8*  MG  --  2.4 2.2  --   --   --   PHOS  --   --  3.0  --   --   --    Liver Function Tests: No results for input(s): AST, ALT, ALKPHOS, BILITOT, PROT, ALBUMIN in the last 168 hours. No results for input(s): LIPASE, AMYLASE in the  last 168 hours. No results for input(s): AMMONIA in the last 168 hours. CBC: Recent Labs  Lab 04/19/19 1708 04/20/19 0411 04/20/19 1027 04/21/19 0118 04/22/19 0228  WBC 4.2 4.5 4.8 4.7 4.9  HGB 13.8 14.3 14.3 13.9 14.1  HCT 41.4 44.0 41.9 41.2 42.3  MCV 90.4 92.1 88.2 90.0 89.4  PLT 171 188 191 184 188   Cardiac Enzymes: No results for input(s): CKTOTAL, CKMB, CKMBINDEX, TROPONINI in the last 168 hours. BNP: BNP (last 3 results) No results for input(s): BNP in the last 8760 hours.  ProBNP (last 3 results) No results for input(s): PROBNP in the last 8760 hours.  CBG: No results for input(s): GLUCAP in the last 168 hours.  Principal Problem:   Chest pain Active Problems:   HTN (hypertension)   NSTEMI (non-ST elevated myocardial infarction) ([place])   Time coordinating discharge: 38 minutes  Signed:        Naoko Diperna, DO [facility]  04/23/2019, 7:51 AM

## 2019-04-23 NOTE — Telephone Encounter (Signed)
1st TCM Call attempt. No answer so I left the pt a message on his VM asking him to call us back at [facility] @ [phone].

## 2019-04-24 NOTE — Telephone Encounter (Signed)
**Note De-Identified Paul Acosta Obfuscation** 2nd TCM Call attempt: No ans so I left a detailed mess on the pts VM asking him to call Jeani Hawking (or a triage nurse) back at [facility] at [phone] to discuss his recent hosp discharge. I also left a reminder that he has a hosp f/u scheduled with Robbie Lis, PA-c on 05/02/19 at 2:15 at [address] [place] in [place], [place] [address].

## 2019-04-30 ENCOUNTER — Ambulatory Visit: Payer: Medicare Other | Admitting: Sports Medicine

## 2019-05-02 ENCOUNTER — Other Ambulatory Visit: Payer: Self-pay

## 2019-05-02 ENCOUNTER — Encounter: Payer: Self-pay | Admitting: Physician Assistant

## 2019-05-02 ENCOUNTER — Ambulatory Visit (INDEPENDENT_AMBULATORY_CARE_PROVIDER_SITE_OTHER): Payer: Medicare Other | Admitting: Physician Assistant

## 2019-05-02 VITALS — BP 132/80 | HR 88 | Ht 78.0 in | Wt 237.0 lb

## 2019-05-02 DIAGNOSIS — I10 Essential (primary) hypertension: Secondary | ICD-10-CM

## 2019-05-02 DIAGNOSIS — I214 Non-ST elevation (NSTEMI) myocardial infarction: Secondary | ICD-10-CM

## 2019-05-02 DIAGNOSIS — I2510 Atherosclerotic heart disease of native coronary artery without angina pectoris: Secondary | ICD-10-CM

## 2019-05-02 NOTE — Progress Notes (Signed)
Cardiology Office Note    Date:  05/02/2019   ID:  Paul Acosta, DOB April 25, 1950, MRN [id number]  PCP:  Maury Dus, MD  Cardiologist:  Dr. Burt Knack   Chief Complaint: Hospital follow up   History of Present Illness:   Paul Acosta is a 69 y.o. male with hx of HTN who recently admitted 04/2019 for NSTEMI. Presented with CP. Cath showed severe proximal LAD stenosis treated with PCI using a drug-eluting stent. DAPT with ASA and Brillinta for 12 months. Aggressive medical therapy for residual CAD. On losartan and amlodipine for high blood pressure.  Would avoid beta blockade with conduction disease noted (right bundle branch block, left axis, first-degree AV block). On statin.   Here today for follow up.  He started some of his workout without any recurrent chest pain or shortness of breath.  He feels more energetic now.  He denies chest pain, shortness of breath, orthopnea, PND, syncope, lower extremity edema, melena or dizziness.  Compliant with his medication.   Past Medical History:  Diagnosis Date  . Hypertension   . Medical history non-contributory     Past Surgical History:  Procedure Laterality Date  . EYE SURGERY    . LEFT HEART CATH AND CORONARY ANGIOGRAPHY N/A 04/21/2019   Procedure: LEFT HEART CATH AND CORONARY ANGIOGRAPHY;  Surgeon: Troy Sine, MD;  Location: [facility];  Service: Cardiovascular;  Laterality: N/A;  . TOTAL HIP ARTHROPLASTY Right 11/07/2013   Procedure: RIGHT TOTAL HIP ARTHROPLASTY;  Surgeon: Kerin Salen, MD;  Location: [facility];  Service: Orthopedics;  Laterality: Right;    Current Medications: Prior to Admission medications   Medication Sig Start Date End Date Taking? Authorizing Provider  amLODipine (NORVASC) 5 MG tablet Take 1 tablet (5 mg total) by mouth daily. 04/22/19   Swayze, Ava, DO  aspirin EC 81 MG tablet Take 81 mg by mouth daily.    [provider]  Cyanocobalamin (B-12 PO) Take 1 tablet by mouth daily.    [provider]  fluticasone (FLONASE) 50 MCG/ACT nasal spray Place 1 spray into both nostrils daily as needed for allergies or rhinitis.    [provider]  losartan (COZAAR) 50 MG tablet Take 50 mg by mouth daily. 06/30/18   [provider]  Multiple Vitamins-Minerals (ZINC PO) Take 1 tablet by mouth daily.    [provider]  nitroGLYCERIN (NITROSTAT) 0.4 MG SL tablet Place 1 tablet (0.4 mg total) under the tongue every 5 (five) minutes as needed for chest pain. 04/22/19   Swayze, Ava, DO  rosuvastatin (CRESTOR) 40 MG tablet Take 1 tablet (40 mg total) by mouth daily at 6 PM. 04/22/19   Swayze, Ava, DO  ticagrelor (BRILINTA) 90 MG TABS tablet Take 1 tablet (90 mg total) by mouth 2 (two) times daily. 04/22/19   Swayze, Ava, DO    Allergies:   Patient has no known allergies.   Social History   Socioeconomic History  . Marital status: Married    Spouse name: Not on file  . Number of children: Not on file  . Years of education: Not on file  . Highest education level: Not on file  Occupational History  . Not on file  Tobacco Use  . Smoking status: Never Smoker  . Smokeless tobacco: Never Used  Substance and Sexual Activity  . Alcohol use: Yes    Alcohol/week: 2.0 standard drinks    Types: 2 Cans of beer per week  .  Drug use: Not on file  . Sexual activity: Not on file  Other Topics Concern  . Not on file  Social History Narrative  . Not on file   Social Determinants of Health   Financial Resource Strain:   . Difficulty of Paying Living Expenses:   Food Insecurity:   . Worried About Charity fundraiser in the Last Year:   . Arboriculturist in the Last Year:   Transportation Needs:   . Film/video editor (Medical):   Marland Kitchen Lack of Transportation (Non-Medical):   Physical Activity:   . Days of Exercise per Week:   . Minutes of Exercise per Session:   Stress:   . Feeling of Stress :   Social Connections:   . Frequency of Communication with Friends and  Family:   . Frequency of Social Gatherings with Friends and Family:   . Attends Religious Services:   . Active Member of Clubs or Organizations:   . Attends Archivist Meetings:   Marland Kitchen Marital Status:      Family History:  The patient's family history is not on file.   ROS:   Please see the history of present illness.    ROS All other systems reviewed and are negative.   PHYSICAL EXAM:   VS:  BP 132/80   Pulse 88   Ht 6\' 6"  (1.981 m)   Wt 237 lb (107.5 kg)   SpO2 96%   BMI 27.39 kg/m    GEN: Well nourished, well developed, in no acute distress  HEENT: normal  Neck: no JVD, carotid bruits, or masses Cardiac: RRR; no murmurs, rubs, or gallops,no edema  Respiratory:  clear to auscultation bilaterally, normal work of breathing GI: soft, nontender, nondistended, + BS MS: no deformity or atrophy  Skin: warm and dry, no rash Neuro:  Alert and Oriented x 3, Strength and sensation are intact Psych: euthymic mood, full affect  Wt Readings from Last 3 Encounters:  05/02/19 237 lb (107.5 kg)  04/21/19 236 lb 8.9 oz (107.3 kg)  04/17/19 235 lb (106.6 kg)      Studies/Labs Reviewed:   EKG:  EKG is ordered today.  The ekg ordered today demonstrates normal sinus rhythm with right bundle branch block  Recent Labs: 04/20/2019: Magnesium 2.2 04/22/2019: BUN 18; Creatinine, Ser 1.09; Hemoglobin 14.1; Platelets 188; Potassium 4.6; Sodium 141   Lipid Panel    Component Value Date/Time   CHOL 170 04/19/2019 1819   TRIG 166 (H) 04/19/2019 1819   HDL 47 04/19/2019 1819   CHOLHDL 3.6 04/19/2019 1819   VLDL 33 04/19/2019 1819   LDLCALC 90 04/19/2019 1819    Additional studies/ records that were reviewed today include:   Echocardiogram: 04/21/19 1. Left ventricular ejection fraction, by estimation, is 55 to 60%. The  left ventricle has normal function. The left ventricle demonstrates  regional wall motion abnormalities (see scoring diagram/findings for  description). There is  moderate concentric  left ventricular hypertrophy. Left ventricular diastolic parameters are  indeterminate.  2. Right ventricular systolic function is normal. The right ventricular  size is normal. Tricuspid regurgitation signal is inadequate for assessing  PA pressure.  3. Left atrial size was mildly dilated.  4. The mitral valve is grossly normal. Mild mitral valve regurgitation.  No evidence of mitral stenosis.  5. The aortic valve is tricuspid. Aortic valve regurgitation is not  visualized. No aortic stenosis is present.  6. The inferior vena cava is normal in size with  greater than 50%  respiratory variability, suggesting right atrial pressure of 3 mmHg.   Conclusion(s)/Recommendation(s): Findings consistent with LAD infarction  LEFT HEART CATH AND CORONARY ANGIOGRAPHY 04/21/19  Conclusion    Prox LAD lesion is 95% stenosed.  Mid LAD-1 lesion is 80% stenosed.  Mid LAD-2 lesion is 30% stenosed.  Ost Cx lesion is 20% stenosed.  Prox Cx to Mid Cx lesion is 40% stenosed.  Mid Cx to Dist Cx lesion is 20% stenosed.  Prox RCA to Mid RCA lesion is 20% stenosed.  A stent was successfully placed.  Post intervention, there is a 0% residual stenosis.  Post intervention, there is a 0% residual stenosis.   Multivessel CAD with 95% proximal LAD stenosis followed by 80% stenosis after the first septal perforating artery and 30% mid LAD stenosis in a large LAD vessel which wraps around the LV apex; 20% smooth ostial stenosis of the circumflex coronary artery followed by 40% somewhat eccentric proximal stenosis and 20% mid stenosis; and dominant RCA with 20% mid stenosis.  Successful percutaneous coronary intervention to the proximal to mid LAD lesions with ultimate insertion of a 3.0 x 32 mm Synergy DES stent postdilated to 3.25 mm with the 95 and 80% stenoses being reduced to 0%.  There is brisk TIMI-3 flow and no evidence for dissection.  LVEDP 31 mm  Hg.  RECOMMENDATION: DAPT therapy for minimum of 12 months.  Medical therapy for mild concomitant CAD with optimal blood pressure lowering with target less than 130/80 and ideal less than 120/80, aggressive lipid-lowering therapy with target LDL less than 70.   Diagnostic Dominance: Right  Intervention        ASSESSMENT & PLAN:   1. CAD s/p DES to pLAD No angina.  Advised gradual increase in exercise program. Continue aspirin, Brilinta, Crestor. Per Dr. Burt Knack, "avoid beta blockade with conduction disease noted (right bundle branch block, left axis, first-degree AV block)".   2. HTN Blood pressure stable and well-controlled on current medications.  3. HLD  04/19/2019: Cholesterol 170; HDL 47; LDL Cholesterol 90; Triglycerides 166; VLDL 33 Continue Crestor 40 mg daily. Lipid panel and LFTs in 6 weeks.   Medication Adjustments/Labs and Tests Ordered: Current medicines are reviewed at length with the patient today.  Concerns regarding medicines are outlined above.  Medication changes, Labs and Tests ordered today are listed in the Patient Instructions below. Patient Instructions  Medication Instructions:  Your physician recommends that you continue on your current medications as directed. Please refer to the Current Medication list given to you today.  *If you need a refill on your cardiac medications before your next appointment, please call your pharmacy*   Lab Work: 5/28 COME TO THE OFFICE FOR FASTING BLOOD WORK (nothing to eat or drink after midnight the night before)   LIPID / LFT  If you have labs (blood work) drawn today and your tests are completely normal, you will receive your results only by: Marland Kitchen MyChart Message (if you have MyChart) OR . A paper copy in the mail If you have any lab test that is abnormal or we need to change your treatment, we will call you to review the results.   Testing/Procedures: None ordered   Follow-Up: At [facility], you and  your health needs are our priority.  As part of our continuing mission to provide you with exceptional heart care, we have created designated Provider Care Teams.  These Care Teams include your primary Cardiologist (physician) and Advanced Practice Providers (APPs -  Physician Assistants and Nurse Practitioners) who all work together to provide you with the care you need, when you need it.  We recommend signing up for the patient portal called "MyChart".  Sign up information is provided on this After Visit Summary.  MyChart is used to connect with patients for Virtual Visits (Telemedicine).  Patients are able to view lab/test results, encounter notes, upcoming appointments, etc.  Non-urgent messages can be sent to your provider as well.   To learn more about what you can do with MyChart, go to [email or web address].    Your next appointment:   3 month(s)    08/06/2019 ARRIVE AT 1:45 TO SEE DR. Burt Knack         The format for your next appointment:   In Person  Provider:   You may see No primary care provider on file. or one of the following Advanced Practice Providers on your designated Care Team:    Richardson Dopp, PA-C  [place], [place]  Daune Perch, NP    Other Instructions      Weston Brass Leanor Kail, [place]  05/02/2019 2:37 PM    [address], [place], [place]  [address] Phone: [phone]; Fax: [phone]

## 2019-05-02 NOTE — Patient Instructions (Addendum)
Medication Instructions:  Your physician recommends that you continue on your current medications as directed. Please refer to the Current Medication list given to you today.  *If you need a refill on your cardiac medications before your next appointment, please call your pharmacy*   Lab Work: 5/28 COME TO THE OFFICE FOR FASTING BLOOD WORK (nothing to eat or drink after midnight the night before)   LIPID / LFT  If you have labs (blood work) drawn today and your tests are completely normal, you will receive your results only by: Marland Kitchen MyChart Message (if you have MyChart) OR . A paper copy in the mail If you have any lab test that is abnormal or we need to change your treatment, we will call you to review the results.   Testing/Procedures: None ordered   Follow-Up: At [facility], you and your health needs are our priority.  As part of our continuing mission to provide you with exceptional heart care, we have created designated Provider Care Teams.  These Care Teams include your primary Cardiologist (physician) and Advanced Practice Providers (APPs -  Physician Assistants and Nurse Practitioners) who all work together to provide you with the care you need, when you need it.  We recommend signing up for the patient portal called "MyChart".  Sign up information is provided on this After Visit Summary.  MyChart is used to connect with patients for Virtual Visits (Telemedicine).  Patients are able to view lab/test results, encounter notes, upcoming appointments, etc.  Non-urgent messages can be sent to your provider as well.   To learn more about what you can do with MyChart, go to [email or web address].    Your next appointment:   3 month(s)    08/06/2019 ARRIVE AT 1:45 TO SEE DR. Burt Knack         The format for your next appointment:   In Person  Provider:   You may see No primary care provider on file. or one of the following Advanced Practice Providers on your designated Care Team:     Richardson Dopp, PA-C  [place], [place]  Daune Perch, NP    Other Instructions

## 2019-05-19 MED ORDER — TICAGRELOR 90 MG PO TABS: 90.0000 mg | ORAL_TABLET | Freq: Two times a day (BID) | ORAL | 11 refills | Status: DC

## 2019-05-19 MED ORDER — AMLODIPINE BESYLATE 5 MG PO TABS: 5.0000 mg | ORAL_TABLET | Freq: Every day | ORAL | 11 refills | Status: DC

## 2019-05-19 MED ORDER — ROSUVASTATIN CALCIUM 40 MG PO TABS: 40.0000 mg | ORAL_TABLET | Freq: Every day | ORAL | 11 refills | Status: DC

## 2019-05-20 ENCOUNTER — Other Ambulatory Visit: Payer: Self-pay

## 2019-05-20 ENCOUNTER — Encounter: Payer: Self-pay | Admitting: Dermatology

## 2019-05-20 ENCOUNTER — Ambulatory Visit (INDEPENDENT_AMBULATORY_CARE_PROVIDER_SITE_OTHER): Payer: Medicare Other | Admitting: Dermatology

## 2019-05-20 DIAGNOSIS — B353 Tinea pedis: Secondary | ICD-10-CM | POA: Diagnosis not present

## 2019-05-20 DIAGNOSIS — Z85828 Personal history of other malignant neoplasm of skin: Secondary | ICD-10-CM

## 2019-05-20 DIAGNOSIS — D229 Melanocytic nevi, unspecified: Secondary | ICD-10-CM

## 2019-05-20 DIAGNOSIS — Z1283 Encounter for screening for malignant neoplasm of skin: Secondary | ICD-10-CM

## 2019-05-20 DIAGNOSIS — B351 Tinea unguium: Secondary | ICD-10-CM

## 2019-05-20 DIAGNOSIS — L570 Actinic keratosis: Secondary | ICD-10-CM

## 2019-05-20 DIAGNOSIS — L821 Other seborrheic keratosis: Secondary | ICD-10-CM

## 2019-05-20 DIAGNOSIS — L814 Other melanin hyperpigmentation: Secondary | ICD-10-CM

## 2019-05-20 DIAGNOSIS — L578 Other skin changes due to chronic exposure to nonionizing radiation: Secondary | ICD-10-CM

## 2019-05-20 DIAGNOSIS — L820 Inflamed seborrheic keratosis: Secondary | ICD-10-CM

## 2019-05-20 DIAGNOSIS — D1800 Hemangioma unspecified site: Secondary | ICD-10-CM

## 2019-05-20 NOTE — Progress Notes (Signed)
Follow-Up Visit   Subjective  Paul Acosta is a 69 y.o. male who presents for the following: Annual Exam (Patient has noticed a lesion on his left chest that has been there for a few months that is changing). The patient presents for total body skin examination for skin cancer screening and mole check.  The following portions of the chart were reviewed this encounter and updated as appropriate:  Tobacco  Allergies  Meds  Problems  Med Hx  Surg Hx  Fam Hx     Review of Systems:  No other skin or systemic complaints except as noted in HPI or Assessment and Plan.  Objective  Well appearing patient in no apparent distress; mood and affect are within normal limits.  A full examination was performed including scalp, head, eyes, ears, nose, lips, neck, chest, axillae, abdomen, back, buttocks, bilateral upper extremities, bilateral lower extremities, hands, feet, fingers, toes, fingernails, and toenails. All findings within normal limits unless otherwise noted below.  Objective  Face and scalp x (16): Erythematous thin papules/macules with gritty scale.   Objective  B/L clavicle x 2 (2): Erythematous keratotic or waxy stuck-on papule or plaque.   Objective  B/L foot: Scaling and maceration web spaces and over distal and lateral soles.   Objective  Right Foot - Anterior: Toenail dystrophy    Assessment & Plan  AK (actinic keratosis) (16) Face and scalp x  On the ant scalp, temple, and cheeks start Skin Medicinals 5FU mix BID x 1 week. Start on June 22nd.   Destruction of lesion - Face and scalp x Complexity: simple   Destruction method: cryotherapy   Informed consent: discussed and consent obtained   Timeout:  patient name, date of birth, surgical site, and procedure verified Lesion destroyed using liquid nitrogen: Yes   Region frozen until ice ball extended beyond lesion: Yes   Outcome: patient tolerated procedure well with no complications   Post-procedure  details: wound care instructions given    Inflamed seborrheic keratosis (2) B/L clavicle x 2  Destruction of lesion - B/L clavicle x 2 Complexity: simple   Destruction method: cryotherapy   Informed consent: discussed and consent obtained   Timeout:  patient name, date of birth, surgical site, and procedure verified Lesion destroyed using liquid nitrogen: Yes   Region frozen until ice ball extended beyond lesion: Yes   Outcome: patient tolerated procedure well with no complications   Post-procedure details: wound care instructions given    Tinea pedis of left foot B/L foot  Start Ketoconazole 2% cream QHS to the feet and between toes.  Tinea unguium Right Foot - Anterior  Start Kerydin sol. QHS x 1 year   Seborrheic Keratoses - Stuck-on, waxy, tan-brown papules and plaques  - Discussed benign etiology and prognosis. - Observe - Call for any changes  Actinic Damage - diffuse scaly erythematous macules with underlying dyspigmentation - Recommend daily broad spectrum sunscreen SPF 30+ to sun-exposed areas, reapply every 2 hours as needed.  - Call for new or changing lesions. -Discussed topical field treatment  Lentigines - Scattered tan macules - Discussed due to sun exposure - Benign, observe - Call for any changes  Melanocytic Nevi - Tan-brown and/or pink-flesh-colored symmetric macules and papules - Benign appearing on exam today - Observation - Call clinic for new or changing moles - Recommend daily use of broad spectrum spf 30+ sunscreen to sun-exposed areas.   Hemangiomas - Red papules - Discussed benign nature - Observe - Call for  any changes  History of Basal Cell Carcinoma of the Skin - No evidence of recurrence today - Recommend regular full body skin exams - Recommend daily broad spectrum sunscreen SPF 30+ to sun-exposed areas, reapply every 2 hours as needed.  - Call if any new or changing lesions are noted between office visits  Return in about  4 months (around 09/20/2019) for recheck AK's.  Luther Redo, CMA, am acting as scribe for [name], MD .   Documentation: I have reviewed the above documentation for accuracy and completeness, and I agree with the above.  [name], MD

## 2019-05-21 ENCOUNTER — Encounter: Payer: Self-pay | Admitting: Dermatology

## 2019-05-23 ENCOUNTER — Other Ambulatory Visit: Payer: Self-pay

## 2019-05-23 MED ORDER — CLOPIDOGREL BISULFATE 75 MG PO TABS: ORAL_TABLET | ORAL | 3 refills | Status: DC

## 2019-05-28 ENCOUNTER — Ambulatory Visit: Payer: Self-pay

## 2019-05-28 ENCOUNTER — Encounter: Payer: Self-pay | Admitting: Sports Medicine

## 2019-05-28 ENCOUNTER — Other Ambulatory Visit: Payer: Self-pay

## 2019-05-28 ENCOUNTER — Ambulatory Visit (INDEPENDENT_AMBULATORY_CARE_PROVIDER_SITE_OTHER): Payer: Medicare Other | Admitting: Sports Medicine

## 2019-05-28 VITALS — BP 122/72 | Ht 78.0 in | Wt 230.0 lb

## 2019-05-28 DIAGNOSIS — G5621 Lesion of ulnar nerve, right upper limb: Secondary | ICD-10-CM | POA: Diagnosis not present

## 2019-05-28 MED ORDER — METHYLPREDNISOLONE ACETATE 40 MG/ML IJ SUSP
40.0000 mg | Freq: Once | INTRAMUSCULAR | Status: AC
  Administered 2019-05-28: 40 mg via INTRA_ARTICULAR

## 2019-05-28 NOTE — Progress Notes (Addendum)
   Paul Acosta [address] [place], [place] [address] Phone: [phone] Fax: [phone]   Patient Name: Paul Acosta Date of Birth: 06-Feb-1950 Medical Record Number: [id number] Gender: male Date of Encounter: 05/28/2019  SUBJECTIVE:      Chief Complaint:  Right cubital tunnel syndrome   HPI:  Paul Acosta is presenting for an injection for right cubital tunnel syndrome.  Unfortunately, in early April he suffered a myocardial infarction, so has been healing from that.  He is still not able to lift weights with his right elbow.  He denies much numbness or weakness into his hand.     ROS:     See HPI.   PERTINENT  PMH / PSH / FH / SH:  Past Medical, Surgical, Social, and Family History Reviewed & Updated in the EMR. Pertinent findings include:  Myocardial infarction early April 2021   OBJECTIVE:  BP 122/72   Ht 6\' 6"  (1.981 m)   Wt 230 lb (104.3 kg)   BMI 26.58 kg/m  Physical Exam:  Vital signs are reviewed.   GEN: Alert and oriented, NAD Pulm: Breathing unlabored PSY: normal mood, congruent affect  MSK: Right Elbow: Unremarkable to inspection. Range of motion full pronation, supination, flexion, extension. Pain with resisted supination Stable to varus, valgus stress. Negative moving valgus stress test. No discrete areas of tenderness to palpation. Ulnar nerve does not sublux. Positive cubital tunnel Tinel's.  Procedure After informed consent was obtained, the patients right elbow was sterilely prepped.  3:1 mixture of 1% lidocaine and methylprednisolone was injected into the right cubital tunnel under ultrasound guidance.  The patient tolerated the procedure well without complication.  The patient is aware that he will likely have numbness and tingling down his right arm for the next 1-2 days.   ASSESSMENT & PLAN:   1. Right cubital tunnel   Successful injection as above.  I am hopeful that this will give patient some pain relief.  I  explained that this is really utilize more as a diagnostic test.  He will let us know if he is not feeling any benefits after the shot.  If no improvement in the near future, can consider nerve conduction study versus MRI.   Lanier Clam, DO, ATC Sports Medicine Fellow  Addendum:  I was the preceptor for this visit and available for immediate consultation. Present for and supervised cubital tunnel injection. Karlton Lemon MD Kirt Boys

## 2019-05-30 DIAGNOSIS — M545 Low back pain: Secondary | ICD-10-CM | POA: Diagnosis not present

## 2019-06-11 DIAGNOSIS — I10 Essential (primary) hypertension: Secondary | ICD-10-CM | POA: Diagnosis not present

## 2019-06-11 DIAGNOSIS — G459 Transient cerebral ischemic attack, unspecified: Secondary | ICD-10-CM | POA: Diagnosis not present

## 2019-06-11 DIAGNOSIS — I209 Angina pectoris, unspecified: Secondary | ICD-10-CM | POA: Diagnosis not present

## 2019-06-11 DIAGNOSIS — E7800 Pure hypercholesterolemia, unspecified: Secondary | ICD-10-CM | POA: Diagnosis not present

## 2019-06-13 ENCOUNTER — Other Ambulatory Visit: Payer: Medicare Other

## 2019-06-20 ENCOUNTER — Other Ambulatory Visit: Payer: Self-pay

## 2019-06-20 ENCOUNTER — Other Ambulatory Visit: Payer: Medicare Other | Admitting: *Deleted

## 2019-06-20 DIAGNOSIS — I2510 Atherosclerotic heart disease of native coronary artery without angina pectoris: Secondary | ICD-10-CM | POA: Diagnosis not present

## 2019-06-20 DIAGNOSIS — I214 Non-ST elevation (NSTEMI) myocardial infarction: Secondary | ICD-10-CM

## 2019-06-20 DIAGNOSIS — I10 Essential (primary) hypertension: Secondary | ICD-10-CM | POA: Diagnosis not present

## 2019-06-20 LAB — HEPATIC FUNCTION PANEL
ALT: 32 IU/L (ref 0–44)
AST: 29 IU/L (ref 0–40)
Albumin: 4.4 g/dL (ref 3.8–4.8)
Alkaline Phosphatase: 64 IU/L (ref 48–121)
Bilirubin Total: 0.4 mg/dL (ref 0.0–1.2)
Bilirubin, Direct: 0.16 mg/dL (ref 0.00–0.40)
Total Protein: 6.5 g/dL (ref 6.0–8.5)

## 2019-06-20 LAB — LIPID PANEL
Chol/HDL Ratio: 2.1 ratio (ref 0.0–5.0)
Cholesterol, Total: 112 mg/dL (ref 100–199)
HDL: 54 mg/dL (ref >39)
LDL Chol Calc (NIH): 44 mg/dL (ref 0–99)
Triglycerides: 66 mg/dL (ref 0–149)
VLDL Cholesterol Cal: 14 mg/dL (ref 5–40)

## 2019-08-06 ENCOUNTER — Encounter: Payer: Self-pay | Admitting: Cardiovascular Disease

## 2019-08-06 ENCOUNTER — Ambulatory Visit (INDEPENDENT_AMBULATORY_CARE_PROVIDER_SITE_OTHER): Payer: Medicare Other | Admitting: Cardiovascular Disease

## 2019-08-06 ENCOUNTER — Other Ambulatory Visit: Payer: Self-pay

## 2019-08-06 VITALS — BP 120/64 | HR 71 | Ht 78.0 in | Wt 239.0 lb

## 2019-08-06 DIAGNOSIS — I10 Essential (primary) hypertension: Secondary | ICD-10-CM | POA: Diagnosis not present

## 2019-08-06 DIAGNOSIS — R011 Cardiac murmur, unspecified: Secondary | ICD-10-CM | POA: Diagnosis not present

## 2019-08-06 DIAGNOSIS — E782 Mixed hyperlipidemia: Secondary | ICD-10-CM

## 2019-08-06 DIAGNOSIS — I2510 Atherosclerotic heart disease of native coronary artery without angina pectoris: Secondary | ICD-10-CM

## 2019-08-06 NOTE — Progress Notes (Signed)
Cardiology Office Note:    Date:  08/06/2019   ID:  Paul Acosta, DOB 08/20/1950, MRN [id number]  PCP:  Maury Dus, MD  [facility] Cardiologist:  Sherren Mocha, MD  [place] Electrophysiologist:  None   Referring MD: Maury Dus, MD   Chief Complaint  Patient presents with  . Coronary Artery Disease    History of Present Illness:    Paul Acosta is a 69 y.o. male with a hx of hypertension and coronary artery disease, presenting for follow-up evaluation.  He presented with non-STEMI in April 2021, found to have severe LAD stenosis treated with a drug-eluting stent.  He had an uncomplicated hospitalization with preserved LV function by echo.  While his LVEF was within normal limits, he was noted to have anteroseptal hypokinesis.  The patient is currently doing well.  He is compliant with his medications.  He has no chest pain, chest pressure, shortness of breath, heart palpitations, orthopnea, or PND.  He has chronic right leg swelling after a remote ankle injury.  Past Medical History:  Diagnosis Date  . Basal cell carcinoma 01/28/2015   R post auricular neck - ED&C   . Hypertension   . Medical history non-contributory     Past Surgical History:  Procedure Laterality Date  . EYE SURGERY    . LEFT HEART CATH AND CORONARY ANGIOGRAPHY N/A 04/21/2019   Procedure: LEFT HEART CATH AND CORONARY ANGIOGRAPHY;  Surgeon: Troy Sine, MD;  Location: [facility];  Service: Cardiovascular;  Laterality: N/A;  . TOTAL HIP ARTHROPLASTY Right 11/07/2013   Procedure: RIGHT TOTAL HIP ARTHROPLASTY;  Surgeon: Kerin Salen, MD;  Location: [facility];  Service: Orthopedics;  Laterality: Right;    Current Medications: Current Meds  Medication Sig  . amLODipine (NORVASC) 5 MG tablet Take 1 tablet (5 mg total) by mouth daily.  Marland Kitchen aspirin EC 81 MG tablet Take 81 mg by mouth daily.  . clopidogrel (PLAVIX) 75 MG tablet Take 300 mg (4 tablets) on the 1st day then decrease to 75 mg (1  tablet) daily thereafter.  . Cyanocobalamin (B-12 PO) Take 1 tablet by mouth daily.  . fluticasone (FLONASE) 50 MCG/ACT nasal spray Place 1 spray into both nostrils daily as needed for allergies or rhinitis.  Marland Kitchen losartan (COZAAR) 50 MG tablet Take 50 mg by mouth daily.  . Multiple Vitamins-Minerals (ZINC PO) Take 1 tablet by mouth daily.  . nitroGLYCERIN (NITROSTAT) 0.4 MG SL tablet Place 1 tablet (0.4 mg total) under the tongue every 5 (five) minutes as needed for chest pain.  . rosuvastatin (CRESTOR) 40 MG tablet Take 1 tablet (40 mg total) by mouth daily at 6 PM.  . ticagrelor (BRILINTA) 90 MG TABS tablet Take 1 tablet (90 mg total) by mouth 2 (two) times daily.     Allergies:   Patient has no known allergies.   Social History   Socioeconomic History  . Marital status: Married    Spouse name: Not on file  . Number of children: Not on file  . Years of education: Not on file  . Highest education level: Not on file  Occupational History  . Not on file  Tobacco Use  . Smoking status: Never Smoker  . Smokeless tobacco: Never Used  Substance and Sexual Activity  . Alcohol use: Yes    Alcohol/week: 2.0 standard drinks    Types: 2 Cans of beer per week  . Drug use: Not on file  . Sexual activity: Not  on file  Other Topics Concern  . Not on file  Social History Narrative  . Not on file   Social Determinants of Health   Financial Resource Strain:   . Difficulty of Paying Living Expenses:   Food Insecurity:   . Worried About Charity fundraiser in the Last Year:   . Arboriculturist in the Last Year:   Transportation Needs:   . Film/video editor (Medical):   Marland Kitchen Lack of Transportation (Non-Medical):   Physical Activity:   . Days of Exercise per Week:   . Minutes of Exercise per Session:   Stress:   . Feeling of Stress :   Social Connections:   . Frequency of Communication with Friends and Family:   . Frequency of Social Gatherings with Friends and Family:   . Attends  Religious Services:   . Active Member of Clubs or Organizations:   . Attends Archivist Meetings:   Marland Kitchen Marital Status:      Family History: The patient's family history is not on file.  ROS:   Please see the history of present illness.    All other systems reviewed and are negative.  EKGs/Labs/Other Studies Reviewed:    The following studies were reviewed today: Cardiac Cath 04/21/2019: Conclusion    Prox LAD lesion is 95% stenosed.  Mid LAD-1 lesion is 80% stenosed.  Mid LAD-2 lesion is 30% stenosed.  Ost Cx lesion is 20% stenosed.  Prox Cx to Mid Cx lesion is 40% stenosed.  Mid Cx to Dist Cx lesion is 20% stenosed.  Prox RCA to Mid RCA lesion is 20% stenosed.  A stent was successfully placed.  Post intervention, there is a 0% residual stenosis.  Post intervention, there is a 0% residual stenosis.   Multivessel CAD with 95% proximal LAD stenosis followed by 80% stenosis after the first septal perforating artery and 30% mid LAD stenosis in a large LAD vessel which wraps around the LV apex; 20% smooth ostial stenosis of the circumflex coronary artery followed by 40% somewhat eccentric proximal stenosis and 20% mid stenosis; and dominant RCA with 20% mid stenosis.  Successful percutaneous coronary intervention to the proximal to mid LAD lesions with ultimate insertion of a 3.0 x 32 mm Synergy DES stent postdilated to 3.25 mm with the 95 and 80% stenoses being reduced to 0%.  There is brisk TIMI-3 flow and no evidence for dissection.  LVEDP 31 mm Hg.  RECOMMENDATION: DAPT therapy for minimum of 12 months.  Medical therapy for mild concomitant CAD with optimal blood pressure lowering with target less than 130/80 and ideal less than 120/80, aggressive lipid-lowering therapy with target LDL less than 70.  Echo 04/21/2019: IMPRESSIONS    1. Left ventricular ejection fraction, by estimation, is 55 to 60%. The  left ventricle has normal function. The left  ventricle demonstrates  regional wall motion abnormalities (see scoring diagram/findings for  description). There is moderate concentric  left ventricular hypertrophy. Left ventricular diastolic parameters are  indeterminate.  2. Right ventricular systolic function is normal. The right ventricular  size is normal. Tricuspid regurgitation signal is inadequate for assessing  PA pressure.  3. Left atrial size was mildly dilated.  4. The mitral valve is grossly normal. Mild mitral valve regurgitation.  No evidence of mitral stenosis.  5. The aortic valve is tricuspid. Aortic valve regurgitation is not  visualized. No aortic stenosis is present.  6. The inferior vena cava is normal in size with greater  than 50%  respiratory variability, suggesting right atrial pressure of 3 mmHg.   Conclusion(s)/Recommendation(s): Findings consistent with LAD infarction.  EKG:  EKG is ordered today.    Recent Labs: 04/20/2019: Magnesium 2.2 04/22/2019: BUN 18; Creatinine, Ser 1.09; Hemoglobin 14.1; Platelets 188; Potassium 4.6; Sodium 141 06/20/2019: ALT 32  Recent Lipid Panel    Component Value Date/Time   CHOL 112 06/20/2019 0733   TRIG 66 06/20/2019 0733   HDL 54 06/20/2019 0733   CHOLHDL 2.1 06/20/2019 0733   CHOLHDL 3.6 04/19/2019 1819   VLDL 33 04/19/2019 1819   LDLCALC 44 06/20/2019 0733    Physical Exam:    VS:  BP 120/64   Pulse 71   Ht 6\' 6"  (1.981 m)   Wt 239 lb (108.4 kg)   SpO2 98%   BMI 27.62 kg/m     Wt Readings from Last 3 Encounters:  08/06/19 239 lb (108.4 kg)  05/28/19 230 lb (104.3 kg)  05/02/19 237 lb (107.5 kg)     GEN: Well nourished, well developed in no acute distress HEENT: Normal NECK: No JVD; No carotid bruits LYMPHATICS: No lymphadenopathy CARDIAC: RRR, 2/6 systolic murmur at the apex RESPIRATORY:  Clear to auscultation without rales, wheezing or rhonchi  ABDOMEN: Soft, non-tender, non-distended MUSCULOSKELETAL:  1+ right ankle edema; No deformity   SKIN: Warm and dry NEUROLOGIC:  Alert and oriented x 3 PSYCHIATRIC:  Normal affect   ASSESSMENT:    1. Coronary artery disease involving native coronary artery of native heart without angina pectoris   2. Mixed hyperlipidemia   3. Essential hypertension   4. Murmur    PLAN:    In order of problems listed above:  1. Patient doing well on current medical program.  He has not been on a beta-blocker because of baseline conduction system disease.  He is on dual antiplatelet therapy with aspirin and ticagrelor.  There is some discussion about switching him to clopidogrel and this is also on his medication list, but I do not believe he ever filled the prescription as he decided to stay on ticagrelor to complete 1 year of DAPT.  We will call him to confirm that he is not taking clopidogrel.  I anticipate stopping ticagrelor when he is 1 year out from his MI.  I will see him back in April of next year. 2. Treated with rosuvastatin 40 mg daily.  Lipids at goal with LDL cholesterol 44 mg/dL.  ALT normal at 32. 3. Blood pressure well controlled on amlodipine and losartan. 4. We will check an echocardiogram at the time of his return visit.  I reviewed his last study and he had no significant valve abnormalities.  He is asymptomatic.   Medication Adjustments/Labs and Tests Ordered: Current medicines are reviewed at length with the patient today.  Concerns regarding medicines are outlined above.  No orders of the defined types were placed in this encounter.  No orders of the defined types were placed in this encounter.   Patient Instructions  Medication Instructions:  Your provider recommends that you continue on your current medications as directed. Please refer to the Current Medication list given to you today.   *If you need a refill on your cardiac medications before your next appointment, please call your pharmacy*   Follow-Up: You will be called to arrange your echo, labs and office visit  with Dr. Burt Knack when the April 2022 schedule is available.    Signed, Sherren Mocha, MD  08/06/2019 2:49 PM  [facility]

## 2019-08-06 NOTE — Patient Instructions (Signed)
Medication Instructions:  Your provider recommends that you continue on your current medications as directed. Please refer to the Current Medication list given to you today.   *If you need a refill on your cardiac medications before your next appointment, please call your pharmacy*   Follow-Up: You will be called to arrange your echo, labs and office visit with Dr. Burt Knack when the April 2022 schedule is available.

## 2019-10-06 ENCOUNTER — Other Ambulatory Visit: Payer: Self-pay

## 2019-10-06 ENCOUNTER — Ambulatory Visit (INDEPENDENT_AMBULATORY_CARE_PROVIDER_SITE_OTHER): Payer: Medicare Other | Admitting: Dermatology

## 2019-10-06 DIAGNOSIS — L570 Actinic keratosis: Secondary | ICD-10-CM

## 2019-10-06 DIAGNOSIS — L820 Inflamed seborrheic keratosis: Secondary | ICD-10-CM

## 2019-10-06 DIAGNOSIS — L578 Other skin changes due to chronic exposure to nonionizing radiation: Secondary | ICD-10-CM

## 2019-10-06 DIAGNOSIS — I2510 Atherosclerotic heart disease of native coronary artery without angina pectoris: Secondary | ICD-10-CM

## 2019-10-06 DIAGNOSIS — L219 Seborrheic dermatitis, unspecified: Secondary | ICD-10-CM

## 2019-10-06 MED ORDER — KETOCONAZOLE 2 % EX CREA: TOPICAL_CREAM | CUTANEOUS | 5 refills | Status: DC

## 2019-10-06 NOTE — Progress Notes (Signed)
   Follow-Up Visit   Subjective  Paul Acosta is a 69 y.o. male who presents for the following: Follow-up (Patient here today for 4 month AK follow up. ).  He used SkinMedicinals 5FU twice a day for 1 week in July to face and scalp with good reactions per patient.   The following portions of the chart were reviewed this encounter and updated as appropriate:  Tobacco  Allergies  Meds  Problems  Med Hx  Surg Hx  Fam Hx     Review of Systems:  No other skin or systemic complaints except as noted in HPI or Assessment and Plan.  Objective  Well appearing patient in no apparent distress; mood and affect are within normal limits.  A focused examination was performed including face, scalp. Relevant physical exam findings are noted in the Assessment and Plan.  Objective  Scalp, face x 8 (8): Erythematous thin papules/macules with gritty scale.   Objective  Scalp: Erythematous keratotic or waxy stuck-on papule or plaque.   Objective  Scalp: Pink scaliness   Assessment & Plan  AK (actinic keratosis) (8) Scalp, face x 8  Destruction of lesion - Scalp, face x 8 Complexity: simple   Destruction method: cryotherapy   Informed consent: discussed and consent obtained   Timeout:  patient name, date of birth, surgical site, and procedure verified Lesion destroyed using liquid nitrogen: Yes   Region frozen until ice ball extended beyond lesion: Yes   Outcome: patient tolerated procedure well with no complications   Post-procedure details: wound care instructions given    Inflamed seborrheic keratosis Scalp  Destruction of lesion - Scalp Complexity: simple   Destruction method: cryotherapy   Informed consent: discussed and consent obtained   Timeout:  patient name, date of birth, surgical site, and procedure verified Lesion destroyed using liquid nitrogen: Yes   Region frozen until ice ball extended beyond lesion: Yes   Outcome: patient tolerated procedure well with no  complications   Post-procedure details: wound care instructions given    Seborrheic dermatitis Scalp  Start ketoconazole 2% cream three times a week at bedtime to scalp.   ketoconazole (NIZORAL) 2 % cream - Scalp   Actinic Damage - diffuse scaly erythematous macules with underlying dyspigmentation - Recommend daily broad spectrum sunscreen SPF 30+ to sun-exposed areas, reapply every 2 hours as needed.  - Call for new or changing lesions.  Return in about 6 months (around 04/04/2020) for AK follow up.  Graciella Belton, [name], am acting as scribe for [name], MD . Documentation: I have reviewed the above documentation for accuracy and completeness, and I agree with the above.  [name], MD

## 2019-10-06 NOTE — Patient Instructions (Addendum)

## 2019-10-07 ENCOUNTER — Encounter: Payer: Self-pay | Admitting: Dermatology

## 2019-10-08 DIAGNOSIS — Z23 Encounter for immunization: Secondary | ICD-10-CM | POA: Diagnosis not present

## 2019-10-28 ENCOUNTER — Other Ambulatory Visit: Payer: Self-pay

## 2019-10-28 ENCOUNTER — Ambulatory Visit (INDEPENDENT_AMBULATORY_CARE_PROVIDER_SITE_OTHER): Payer: Medicare Other | Admitting: Sports Medicine

## 2019-10-28 ENCOUNTER — Ambulatory Visit
Admission: RE | Admit: 2019-10-28 | Discharge: 2019-10-28 | Disposition: A | Discharge status: Home / Self Care | Payer: Medicare Other | Source: Ambulatory Visit | Attending: Sports Medicine | Admitting: Sports Medicine

## 2019-10-28 VITALS — BP 130/70 | Ht 78.0 in | Wt 230.0 lb

## 2019-10-28 DIAGNOSIS — I2510 Atherosclerotic heart disease of native coronary artery without angina pectoris: Secondary | ICD-10-CM

## 2019-10-28 DIAGNOSIS — M19021 Primary osteoarthritis, right elbow: Secondary | ICD-10-CM | POA: Diagnosis not present

## 2019-10-28 DIAGNOSIS — M25711 Osteophyte, right shoulder: Secondary | ICD-10-CM | POA: Diagnosis not present

## 2019-10-28 DIAGNOSIS — M25521 Pain in right elbow: Secondary | ICD-10-CM

## 2019-10-28 DIAGNOSIS — M25511 Pain in right shoulder: Secondary | ICD-10-CM

## 2019-10-28 DIAGNOSIS — M19019 Primary osteoarthritis, unspecified shoulder: Secondary | ICD-10-CM | POA: Diagnosis not present

## 2019-10-28 DIAGNOSIS — M19011 Primary osteoarthritis, right shoulder: Secondary | ICD-10-CM | POA: Diagnosis not present

## 2019-10-28 MED ORDER — PREDNISONE 10 MG PO TABS: ORAL_TABLET | ORAL | 0 refills | Status: DC

## 2019-10-28 NOTE — Patient Instructions (Signed)
Get x-rays done today.....  Pick up your medication from the pharmacy...Marland KitchenMarland KitchenMarland Kitchen  Come back in one week and we will go over x-ray results and do an ultrasound in the office.Marland KitchenMarland KitchenMarland Kitchen

## 2019-10-29 NOTE — Progress Notes (Signed)
Subjective:    Patient ID: Paul Acosta, male    DOB: Apr 25, 1950, 69 y.o.   MRN: [id number]  HPI chief complaint right elbow and right shoulder pain  Paul Acosta comes in today with a couple of different complaints.  He is complaining of returning right elbow pain for the past 2 weeks.  He was diagnosed with cubital tunnel syndrome this past spring and a cortisone injection helped tremendously.  Pain has begun to return.  His symptoms all began with a traumatic episode in which she suffered what sounds like a valgus stress injury to the medial elbow when he was lifting his 120 pound dog over a year ago.  He is very active and enjoys weightlifting and notices most of his pain with certain lifts.  All of his pain is in the medial elbow.  He initially had some pain, numbness, and tingling into the ulnar aspect of the right hand but he denies those symptoms recently.  Denies any pain along the lateral elbow.  He has not noticed any swelling.  He does remember a remote injury to the medial elbow when playing baseball at a younger age.  Specifically, when throwing a pitch, he felt a pop in the medial elbow which was treated conservatively.  No prior surgeries on his elbow.  He is ambidextrous, throwing with his right arm and writing with his left hand.  He is also complaining of chronic right shoulder pain.  Pain is diffuse throughout the shoulder and present with reaching overhead or around behind his back.  Pain with certain lifts in the gym as well.  No recent injury.  No past injury to the shoulder.  Pain at night.  Has been gradually worsening over the past couple of years.  Past medical history reviewed Medications reviewed Allergies reviewed    Review of Systems   As above Objective:   Physical Exam  Well-developed, well-nourished.  No acute distress.  Right shoulder: Patient has full active and passive abduction and forward flexion.  Internal rotation is limited to about 60 degrees actively.   Passive external rotation lacks about 10 degrees when compared to the uninvolved left shoulder.  No tenderness to palpation at the acromioclavicular joint nor over the bicipital groove.  Rotator cuff strength is 5/5.  Negative O'Brien's.  Negative speeds, negative Yergason's.  Right elbow: Full range of motion.  No effusion.  No soft tissue swelling.  There is no tenderness to palpation over the medial or lateral epicondyle.  Slight tenderness to palpation in the cubital tunnel but a negative Tinel's.  There is appreciable laxity of the ulnar collateral ligament with valgus stressing when compared to the left elbow.  No pain with milking maneuver.  There is a palpable clunk in the elbow with passive flexion and extension.  Full strength distally in the hand and wrist.  Good grip strength.  Good pulses.  X-rays of the right shoulder including AP, axillary, and lateral views show advanced glenohumeral DJD.  Nothing acute.  X-rays of the right elbow including AP and lateral views are fairly unremarkable.  No significant degenerative changes seen.  No effusion.      Assessment & Plan:   Right shoulder pain likely secondary to advanced glenohumeral DJD Right elbow pain possibly secondary to ulnar nerve subluxation versus chronic UCL tear  Patient will return to the office in 1 week for complete ultrasound of the right shoulder.  X-rays show advanced glenohumeral DJD and ultrasound will help [place] determine how  much rotator cuff pathology he has.  Based on those findings, we will then discuss treatment options going forward.  In regards to the right elbow, I explained to Paul Acosta that his injection was only a few months ago and that I do not think a repeat injection today will be beneficial in the long-term.  I have put him on a 6-day Sterapred Dosepak and we will reevaluate this at his follow-up visit next week.  If he continues to have pain and I will likely recommend further diagnostic imaging in the form of  an MRI arthrogram.  Paul Acosta is in agreement with this plan.

## 2019-11-04 ENCOUNTER — Other Ambulatory Visit: Payer: Medicare Other | Admitting: Sports Medicine

## 2019-11-05 ENCOUNTER — Ambulatory Visit (INDEPENDENT_AMBULATORY_CARE_PROVIDER_SITE_OTHER): Payer: Medicare Other | Admitting: Sports Medicine

## 2019-11-05 ENCOUNTER — Other Ambulatory Visit: Payer: Self-pay

## 2019-11-05 VITALS — BP 130/82 | Ht 78.0 in | Wt 230.0 lb

## 2019-11-05 DIAGNOSIS — I2510 Atherosclerotic heart disease of native coronary artery without angina pectoris: Secondary | ICD-10-CM | POA: Diagnosis not present

## 2019-11-05 DIAGNOSIS — E7800 Pure hypercholesterolemia, unspecified: Secondary | ICD-10-CM | POA: Diagnosis not present

## 2019-11-05 DIAGNOSIS — I209 Angina pectoris, unspecified: Secondary | ICD-10-CM | POA: Diagnosis not present

## 2019-11-05 DIAGNOSIS — M25521 Pain in right elbow: Secondary | ICD-10-CM | POA: Diagnosis not present

## 2019-11-05 DIAGNOSIS — I10 Essential (primary) hypertension: Secondary | ICD-10-CM | POA: Diagnosis not present

## 2019-11-05 DIAGNOSIS — G459 Transient cerebral ischemic attack, unspecified: Secondary | ICD-10-CM | POA: Diagnosis not present

## 2019-11-05 DIAGNOSIS — M19019 Primary osteoarthritis, unspecified shoulder: Secondary | ICD-10-CM

## 2019-11-05 NOTE — Progress Notes (Signed)
Patient ID: Paul Acosta, male   DOB: [birth year], 69 y.o.   MRN: [id number]  Paul Acosta comes in today for an ultrasound of the right shoulder.  X-rays of the right shoulder done last week shows advanced glenohumeral DJD.  He is also here for follow-up on his right elbow.  Recent Sterapred Dosepak was helpful but his pain is starting to return.  X-rays of the right elbow done last week showed mild degenerative changes.  His pain continues to be along the medial elbow.  He is not endorsing any numbness, tingling, or pain along the ulnar aspect of the forearm or hand.  Limited MSK ultrasound of the right shoulder was performed today.  Biceps tendon was visualized in the bicipital groove without abnormality.  Subscapularis was difficult to visualize but appears to be intact.  Supraspinatus and infraspinatus were both well visualized with no obvious tearing.  It appears that the dominant finding in the right elbow is advanced glenohumeral DJD.  At this point in time, Paul Acosta's pain is not affecting his function to the point that he wants to consider any sort of aggressive treatment.  He has already started to modify his workouts and this seems to be going well.  His right elbow pain, however, is definitely bothersome to him.  Although his x-rays show some mild degenerative change, his exam last week suggests a chronic ulnar collateral ligament tear.  He also had a cortisone injection for cubital tunnel which was helpful.  At this point, I have recommended further diagnostic imaging in the form of an MRI arthrogram specifically to evaluate the integrity of the ulnar collateral ligament.  This will also rule out any tearing of the common flexor tendon and it will visualize the ulnar nerve in the cubital tunnel.  Phone follow-up with those findings when available.  We will delineate further treatment based on those results.  Patient is in agreement with this plan.

## 2019-11-17 ENCOUNTER — Ambulatory Visit
Admission: RE | Admit: 2019-11-17 | Discharge: 2019-11-17 | Disposition: A | Discharge status: Home / Self Care | Payer: Medicare Other | Source: Ambulatory Visit | Attending: Sports Medicine | Admitting: Sports Medicine

## 2019-11-17 ENCOUNTER — Other Ambulatory Visit: Payer: Self-pay

## 2019-11-17 DIAGNOSIS — M25522 Pain in left elbow: Secondary | ICD-10-CM | POA: Diagnosis not present

## 2019-11-17 DIAGNOSIS — M25521 Pain in right elbow: Secondary | ICD-10-CM

## 2019-11-17 DIAGNOSIS — S56511A Strain of other extensor muscle, fascia and tendon at forearm level, right arm, initial encounter: Secondary | ICD-10-CM | POA: Diagnosis not present

## 2019-11-17 MED ORDER — IOPAMIDOL (ISOVUE-M 200) INJECTION 41%
3.0000 mL | Freq: Once | INTRAMUSCULAR | Status: AC
  Administered 2019-11-17: 3 mL via INTRA_ARTICULAR

## 2019-11-19 DIAGNOSIS — M1612 Unilateral primary osteoarthritis, left hip: Secondary | ICD-10-CM | POA: Diagnosis not present

## 2019-11-24 DIAGNOSIS — M1612 Unilateral primary osteoarthritis, left hip: Secondary | ICD-10-CM | POA: Diagnosis not present

## 2019-11-26 ENCOUNTER — Telehealth: Payer: Self-pay | Admitting: Sports Medicine

## 2019-11-26 NOTE — Telephone Encounter (Signed)
  I spoke with Paul Acosta on the phone today after reviewing the MRI arthrogram of his right elbow.  Arthrogram does confirm a tear of the ulnar collateral ligament which is partially stripped from its attachment on the sublime tubercle.  He also has partial-thickness tearing of the common flexor tendon.  Ulnar nerve is unremarkable.  Based on this constellation of symptoms I recommended consultation with Dr. Tamera Punt to discuss further treatment options.  Patient is in agreement with that plan.  Follow-up with me as needed.

## 2019-12-03 DIAGNOSIS — M25522 Pain in left elbow: Secondary | ICD-10-CM | POA: Diagnosis not present

## 2019-12-04 DIAGNOSIS — Z23 Encounter for immunization: Secondary | ICD-10-CM | POA: Diagnosis not present

## 2019-12-08 ENCOUNTER — Telehealth: Payer: Self-pay

## 2019-12-08 DIAGNOSIS — E782 Mixed hyperlipidemia: Secondary | ICD-10-CM

## 2019-12-08 DIAGNOSIS — I214 Non-ST elevation (NSTEMI) myocardial infarction: Secondary | ICD-10-CM

## 2019-12-08 NOTE — Telephone Encounter (Signed)
Called patient to scheduled 1 year post MI echo and visit with Dr. Burt Knack. Scheduled him for fasting labs and echo 04/14/20 and visit with Dr. Burt Knack 04/16/20 to review results.  He was grateful for call and agrees with plan.

## 2019-12-18 DIAGNOSIS — H6982 Other specified disorders of Eustachian tube, left ear: Secondary | ICD-10-CM | POA: Diagnosis not present

## 2020-02-13 ENCOUNTER — Other Ambulatory Visit: Payer: Self-pay | Admitting: Cardiovascular Disease

## 2020-04-12 ENCOUNTER — Ambulatory Visit: Payer: Medicare Other | Admitting: Dermatology

## 2020-04-14 ENCOUNTER — Other Ambulatory Visit: Payer: Medicare Other | Admitting: *Deleted

## 2020-04-14 ENCOUNTER — Other Ambulatory Visit: Payer: Self-pay

## 2020-04-14 ENCOUNTER — Ambulatory Visit (HOSPITAL_COMMUNITY): Payer: Medicare Other | Attending: Cardiovascular Disease

## 2020-04-14 DIAGNOSIS — E782 Mixed hyperlipidemia: Secondary | ICD-10-CM

## 2020-04-14 DIAGNOSIS — I214 Non-ST elevation (NSTEMI) myocardial infarction: Secondary | ICD-10-CM

## 2020-04-14 LAB — CBC WITH DIFFERENTIAL/PLATELET
Basophils Absolute: 0 10*3/uL (ref 0.0–0.2)
Basos: 1 %
EOS (ABSOLUTE): 0.1 10*3/uL (ref 0.0–0.4)
Eos: 3 %
Hematocrit: 42.2 % (ref 37.5–51.0)
Hemoglobin: 14.2 g/dL (ref 13.0–17.7)
Immature Grans (Abs): 0 10*3/uL (ref 0.0–0.1)
Immature Granulocytes: 0 %
Lymphocytes Absolute: 1.5 10*3/uL (ref 0.7–3.1)
Lymphs: 32 %
MCH: 30.2 pg (ref 26.6–33.0)
MCHC: 33.6 g/dL (ref 31.5–35.7)
MCV: 90 fL (ref 79–97)
Monocytes Absolute: 0.4 10*3/uL (ref 0.1–0.9)
Monocytes: 8 %
Neutrophils Absolute: 2.7 10*3/uL (ref 1.4–7.0)
Neutrophils: 56 %
Platelets: 187 10*3/uL (ref 150–450)
RBC: 4.7 x10E6/uL (ref 4.14–5.80)
RDW: 12.7 % (ref 11.6–15.4)
WBC: 4.7 10*3/uL (ref 3.4–10.8)

## 2020-04-14 LAB — COMPREHENSIVE METABOLIC PANEL
ALT: 30 IU/L (ref 0–44)
AST: 34 IU/L (ref 0–40)
Albumin/Globulin Ratio: 2.1 (ref 1.2–2.2)
Albumin: 4.2 g/dL (ref 3.8–4.8)
Alkaline Phosphatase: 52 IU/L (ref 44–121)
BUN/Creatinine Ratio: 19 (ref 10–24)
BUN: 19 mg/dL (ref 8–27)
Bilirubin Total: 0.3 mg/dL (ref 0.0–1.2)
CO2: 21 mmol/L (ref 20–29)
Calcium: 8.8 mg/dL (ref 8.6–10.2)
Chloride: 106 mmol/L (ref 96–106)
Creatinine, Ser: 1.02 mg/dL (ref 0.76–1.27)
Globulin, Total: 2 g/dL (ref 1.5–4.5)
Glucose: 86 mg/dL (ref 65–99)
Potassium: 4.5 mmol/L (ref 3.5–5.2)
Sodium: 140 mmol/L (ref 134–144)
Total Protein: 6.2 g/dL (ref 6.0–8.5)
eGFR: 80 mL/min/{1.73_m2} (ref >59)

## 2020-04-14 LAB — LIPID PANEL
Chol/HDL Ratio: 2.2 ratio (ref 0.0–5.0)
Cholesterol, Total: 112 mg/dL (ref 100–199)
HDL: 50 mg/dL (ref >39)
LDL Chol Calc (NIH): 48 mg/dL (ref 0–99)
Triglycerides: 63 mg/dL (ref 0–149)
VLDL Cholesterol Cal: 14 mg/dL (ref 5–40)

## 2020-04-14 LAB — ECHOCARDIOGRAM COMPLETE
Area-P 1/2: 3.91 cm2
S' Lateral: 3.7 cm

## 2020-04-16 ENCOUNTER — Other Ambulatory Visit: Payer: Self-pay

## 2020-04-16 ENCOUNTER — Ambulatory Visit (INDEPENDENT_AMBULATORY_CARE_PROVIDER_SITE_OTHER): Payer: Medicare Other | Admitting: Cardiovascular Disease

## 2020-04-16 ENCOUNTER — Encounter: Payer: Self-pay | Admitting: Cardiovascular Disease

## 2020-04-16 VITALS — BP 124/70 | HR 61 | Ht 78.0 in | Wt 246.2 lb

## 2020-04-16 DIAGNOSIS — E782 Mixed hyperlipidemia: Secondary | ICD-10-CM | POA: Diagnosis not present

## 2020-04-16 DIAGNOSIS — I10 Essential (primary) hypertension: Secondary | ICD-10-CM

## 2020-04-16 DIAGNOSIS — I2510 Atherosclerotic heart disease of native coronary artery without angina pectoris: Secondary | ICD-10-CM | POA: Diagnosis not present

## 2020-04-16 NOTE — Patient Instructions (Signed)
Medication Instructions:  1) you may STOP BRILINTA when your pills run out *If you need a refill on your cardiac medications before your next appointment, please call your pharmacy*  Follow-Up: At [facility], you and your health needs are our priority.  As part of our continuing mission to provide you with exceptional heart care, we have created designated Provider Care Teams.  These Care Teams include your primary Cardiologist (physician) and Advanced Practice Providers (APPs -  Physician Assistants and Nurse Practitioners) who all work together to provide you with the care you need, when you need it. Your next appointment:   12 month(s) The format for your next appointment:   In Person Provider:   You may see Sherren Mocha, MD or one of the following Advanced Practice Providers on your designated Care Team:    Richardson Dopp, PA-C  [place], [place]

## 2020-04-16 NOTE — Progress Notes (Signed)
Cardiology Office Note:    Date:  04/16/2020   ID:  ORLAND VISCONTI, DOB [birth year], MRN [id number]  PCP:  Maury Dus, [place]  Cardiologist:  Sherren Mocha, MD  Advanced Practice Provider:  No care team member to display Electrophysiologist:  None       Referring MD: Maury Dus, MD   Chief Complaint  Patient presents with  . Coronary Artery Disease    History of Present Illness:    Paul Acosta is a 70 y.o. male with a hx of hypertension and coronary artery disease, presenting for follow-up evaluation.  He presented with non-STEMI in April 2021, found to have severe LAD stenosis treated with a drug-eluting stent.  He had an uncomplicated hospitalization with preserved LV function by echo.  While his LVEF was within normal limits, he was noted to have anteroseptal hypokinesis.    The patient is doing well.  He is physically active with regular exercise and no exertional symptoms.  He denies chest pain, shortness of breath, heart palpitations, lightheadedness, or syncope.  He has mild leg swelling, especially in the right ankle where he has had prior injuries.  No other complaints today.  Seems to be tolerating his medicines well.  No bleeding problems.  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   Past Medical History:  Diagnosis Date  . Basal cell carcinoma 01/28/2015   R post auricular neck - ED&C   . Dysplastic nevus 07/27/2009   R med pectoral - mild   . Hypertension   . Medical history non-contributory     Past Surgical History:  Procedure Laterality Date  . EYE SURGERY    . LEFT HEART CATH AND CORONARY ANGIOGRAPHY N/A 04/21/2019   Procedure: LEFT HEART CATH AND CORONARY ANGIOGRAPHY;  Surgeon: Troy Sine, MD;  Location: [facility];  Service: Cardiovascular;  Laterality: N/A;  . TOTAL HIP ARTHROPLASTY Right 11/07/2013   Procedure: RIGHT TOTAL HIP ARTHROPLASTY;  Surgeon:  Kerin Salen, MD;  Location: [facility];  Service: Orthopedics;  Laterality: Right;    Current Medications: Current Meds  Medication Sig  . amLODipine (NORVASC) 5 MG tablet TAKE ONE TABLET BY MOUTH DAILY  . aspirin EC 81 MG tablet Take 81 mg by mouth daily.  . Cyanocobalamin (B-12 PO) Take 1 tablet by mouth daily.  . fluticasone (FLONASE) 50 MCG/ACT nasal spray Place 1 spray into both nostrils daily as needed for allergies or rhinitis.  Marland Kitchen ketoconazole (NIZORAL) 2 % cream Apply to affected areas at scalp three times weekly at bedtime.  Marland Kitchen losartan (COZAAR) 50 MG tablet Take 50 mg by mouth daily.  . meloxicam (MOBIC) 15 MG tablet Take 15 mg by mouth daily.  . Multiple Vitamins-Minerals (ZINC PO) Take 1 tablet by mouth daily.  . nitroGLYCERIN (NITROSTAT) 0.4 MG SL tablet Place 1 tablet (0.4 mg total) under the tongue every 5 (five) minutes as needed for chest pain.  . rosuvastatin (CRESTOR) 40 MG tablet Take 1 tablet (40 mg total) by mouth daily at 6 PM.  . [DISCONTINUED] clopidogrel (PLAVIX) 75 MG tablet Take 300 mg (4 tablets) on the 1st day then decrease to 75 mg (1 tablet) daily thereafter.  . [DISCONTINUED] ticagrelor (BRILINTA) 90 MG TABS tablet Take 1 tablet (90 mg total) by mouth 2 (two) times daily.     Allergies:   Patient has no known allergies.   Social History  Socioeconomic History  . Marital status: Married    Spouse name: Not on file  . Number of children: Not on file  . Years of education: Not on file  . Highest education level: Not on file  Occupational History  . Not on file  Tobacco Use  . Smoking status: Never Smoker  . Smokeless tobacco: Never Used  Substance and Sexual Activity  . Alcohol use: Yes    Alcohol/week: 2.0 standard drinks    Types: 2 Cans of beer per week  . Drug use: Not on file  . Sexual activity: Not on file  Other Topics Concern  . Not on file  Social History Narrative  . Not on file   Social Determinants of Health   Financial Resource  Strain: Not on file  Food Insecurity: Not on file  Transportation Needs: Not on file  Physical Activity: Not on file  Stress: Not on file  Social Connections: Not on file     Family History: The patient's family history is not on file.  ROS:   Please see the history of present illness.    All other systems reviewed and are negative.  EKGs/Labs/Other Studies Reviewed:    The following studies were reviewed today: Echo 04/14/2020: IMPRESSIONS    1. Left ventricular ejection fraction, by estimation, is 55 to 60%. Left  ventricular ejection fraction by PLAX is 59 %. The left ventricle has  normal function. The left ventricle has no regional wall motion  abnormalities. There is mild concentric left  ventricular hypertrophy. Left ventricular diastolic parameters were  normal.  2. Right ventricular systolic function is normal. The right ventricular  size is normal.  3. Left atrial size was mildly dilated.  4. The mitral valve is normal in structure. Trivial mitral valve  regurgitation. No evidence of mitral stenosis.  5. The aortic valve is tricuspid. Aortic valve regurgitation is not  visualized. No aortic stenosis is present.  6. Aortic dilatation noted. There is mild dilatation of the aortic root,  measuring 42 mm. There is mild dilatation of the ascending aorta,  measuring 41 mm.  7. The inferior vena cava is normal in size with greater than 50%  respiratory variability, suggesting right atrial pressure of 3 mmHg.   Cardiac Catheterization 04/21/2019: Conclusion    Prox LAD lesion is 95% stenosed.  Mid LAD-1 lesion is 80% stenosed.  Mid LAD-2 lesion is 30% stenosed.  Ost Cx lesion is 20% stenosed.  Prox Cx to Mid Cx lesion is 40% stenosed.  Mid Cx to Dist Cx lesion is 20% stenosed.  Prox RCA to Mid RCA lesion is 20% stenosed.  A stent was successfully placed.  Post intervention, there is a 0% residual stenosis.  Post intervention, there is a 0%  residual stenosis.   Multivessel CAD with 95% proximal LAD stenosis followed by 80% stenosis after the first septal perforating artery and 30% mid LAD stenosis in a large LAD vessel which wraps around the LV apex; 20% smooth ostial stenosis of the circumflex coronary artery followed by 40% somewhat eccentric proximal stenosis and 20% mid stenosis; and dominant RCA with 20% mid stenosis.  Successful percutaneous coronary intervention to the proximal to mid LAD lesions with ultimate insertion of a 3.0 x 32 mm Synergy DES stent postdilated to 3.25 mm with the 95 and 80% stenoses being reduced to 0%.  There is brisk TIMI-3 flow and no evidence for dissection.  LVEDP 31 mm Hg.  RECOMMENDATION: DAPT therapy for minimum of  12 months.  Medical therapy for mild concomitant CAD with optimal blood pressure lowering with target less than 130/80 and ideal less than 120/80, aggressive lipid-lowering therapy with target LDL less than 70.   EKG:  EKG is ordered today.  The ekg ordered today demonstrates normal sinus rhythm, right bundle branch block, no change from prior tracings  Recent Labs: 04/20/2019: Magnesium 2.2 04/14/2020: ALT 30; BUN 19; Creatinine, Ser 1.02; Hemoglobin 14.2; Platelets 187; Potassium 4.5; Sodium 140  Recent Lipid Panel    Component Value Date/Time   CHOL 112 04/14/2020 0733   TRIG 63 04/14/2020 0733   HDL 50 04/14/2020 0733   CHOLHDL 2.2 04/14/2020 0733   CHOLHDL 3.6 04/19/2019 1819   VLDL 33 04/19/2019 1819   LDLCALC 48 04/14/2020 0733     Risk Assessment/Calculations:       Physical Exam:    VS:  BP 124/70   Pulse 61   Ht 6\' 6"  (1.981 m)   Wt 246 lb 3.2 oz (111.7 kg)   SpO2 98%   BMI 28.45 kg/m     Wt Readings from Last 3 Encounters:  04/16/20 246 lb 3.2 oz (111.7 kg)  11/05/19 230 lb (104.3 kg)  10/28/19 230 lb (104.3 kg)     GEN:  Well nourished, well developed in no acute distress HEENT: Normal NECK: No JVD; No carotid bruits LYMPHATICS: No  lymphadenopathy CARDIAC: RRR, soft systolic ejection murmur at the right upper sternal border RESPIRATORY:  Clear to auscultation without rales, wheezing or rhonchi  ABDOMEN: Soft, non-tender, non-distended MUSCULOSKELETAL: Trace bilateral ankle edema; No deformity  SKIN: Warm and dry NEUROLOGIC:  Alert and oriented x 3 PSYCHIATRIC:  Normal affect   ASSESSMENT:    1. Coronary artery disease involving native coronary artery of native heart without angina pectoris   2. Mixed hyperlipidemia   3. Essential hypertension    PLAN:    In order of problems listed above:  1. Patient now 1 year out from non-STEMI/PCI.  Okay to stop ticagrelor.  He will finish his current supply and then discontinue this medicine.  He remains compliant with aspirin 81 mg daily and understands that he will need to take this long-term.  I will see him back in 1 year unless problems arise.  I reviewed his recent echo which shows normal LV function and no regional wall motion abnormalities. 2. Treated with rosuvastatin 40 mg daily.  Lipids from last year show a cholesterol of 112, HDL 54, LDL 44.  ALT was normal at 32.  Followed by primary care.  Annual physical exam is in May of this year. 3. Blood pressure is very well controlled on amlodipine and losartan.  No changes are made today.        Medication Adjustments/Labs and Tests Ordered: Current medicines are reviewed at length with the patient today.  Concerns regarding medicines are outlined above.  Orders Placed This Encounter  Procedures  . EKG 12-Lead   No orders of the defined types were placed in this encounter.   Patient Instructions  Medication Instructions:  1) you may STOP BRILINTA when your pills run out *If you need a refill on your cardiac medications before your next appointment, please call your pharmacy*  Follow-Up: At [facility], you and your health needs are our priority.  As part of our continuing mission to provide you with  exceptional heart care, we have created designated Provider Care Teams.  These Care Teams include your primary Cardiologist (physician) and Advanced Practice  Providers (APPs -  Physician Assistants and Nurse Practitioners) who all work together to provide you with the care you need, when you need it. Your next appointment:   12 month(s) The format for your next appointment:   In Person Provider:   You may see Sherren Mocha, MD or one of the following Advanced Practice Providers on your designated Care Team:    Richardson Dopp, PA-C  Robbie Lis, [place]      Signed, Sherren Mocha, MD  04/16/2020 9:10 AM    [place]

## 2020-04-30 DIAGNOSIS — Z23 Encounter for immunization: Secondary | ICD-10-CM | POA: Diagnosis not present

## 2020-05-17 ENCOUNTER — Other Ambulatory Visit: Payer: Self-pay | Admitting: Cardiovascular Disease

## 2020-05-27 ENCOUNTER — Ambulatory Visit (INDEPENDENT_AMBULATORY_CARE_PROVIDER_SITE_OTHER): Payer: Medicare Other | Admitting: Dermatology

## 2020-05-27 ENCOUNTER — Encounter: Payer: Self-pay | Admitting: Dermatology

## 2020-05-27 ENCOUNTER — Other Ambulatory Visit: Payer: Self-pay

## 2020-05-27 DIAGNOSIS — I2510 Atherosclerotic heart disease of native coronary artery without angina pectoris: Secondary | ICD-10-CM

## 2020-05-27 DIAGNOSIS — B353 Tinea pedis: Secondary | ICD-10-CM | POA: Diagnosis not present

## 2020-05-27 DIAGNOSIS — D1800 Hemangioma unspecified site: Secondary | ICD-10-CM | POA: Diagnosis not present

## 2020-05-27 DIAGNOSIS — L820 Inflamed seborrheic keratosis: Secondary | ICD-10-CM

## 2020-05-27 DIAGNOSIS — L570 Actinic keratosis: Secondary | ICD-10-CM | POA: Diagnosis not present

## 2020-05-27 DIAGNOSIS — Z1283 Encounter for screening for malignant neoplasm of skin: Secondary | ICD-10-CM

## 2020-05-27 DIAGNOSIS — B351 Tinea unguium: Secondary | ICD-10-CM

## 2020-05-27 DIAGNOSIS — Z86018 Personal history of other benign neoplasm: Secondary | ICD-10-CM | POA: Diagnosis not present

## 2020-05-27 DIAGNOSIS — D229 Melanocytic nevi, unspecified: Secondary | ICD-10-CM | POA: Diagnosis not present

## 2020-05-27 DIAGNOSIS — L821 Other seborrheic keratosis: Secondary | ICD-10-CM

## 2020-05-27 DIAGNOSIS — L578 Other skin changes due to chronic exposure to nonionizing radiation: Secondary | ICD-10-CM | POA: Diagnosis not present

## 2020-05-27 DIAGNOSIS — L814 Other melanin hyperpigmentation: Secondary | ICD-10-CM | POA: Diagnosis not present

## 2020-05-27 DIAGNOSIS — Z85828 Personal history of other malignant neoplasm of skin: Secondary | ICD-10-CM | POA: Diagnosis not present

## 2020-05-27 MED ORDER — TERBINAFINE HCL 250 MG PO TABS: 250.0000 mg | ORAL_TABLET | Freq: Every day | ORAL | 0 refills | Status: DC

## 2020-05-27 NOTE — Patient Instructions (Signed)

## 2020-05-27 NOTE — Progress Notes (Signed)
Follow-Up Visit   Subjective  Paul Acosta is a 70 y.o. male who presents for the following: Total body skin exam (Hx of BCC R post auricular neck, Hx of Dysplastic Nevus R med pectoral) and check spot (R ear no symptoms). The patient presents for Total-Body Skin Exam (TBSE) for skin cancer screening and mole check.  The following portions of the chart were reviewed this encounter and updated as appropriate:   Tobacco  Allergies  Meds  Problems  Med Hx  Surg Hx  Fam Hx     Review of Systems:  No other skin or systemic complaints except as noted in HPI or Assessment and Plan.  Objective  Well appearing patient in no apparent distress; mood and affect are within normal limits.  A full examination was performed including scalp, head, eyes, ears, nose, lips, neck, chest, axillae, abdomen, back, buttocks, bilateral upper extremities, bilateral lower extremities, hands, feet, fingers, toes, fingernails, and toenails. All findings within normal limits unless otherwise noted below.  Objective  R post auricular neck: Well healed scar with no evidence of recurrence.   Objective  Right med pectoral: Scar with no evidence of recurrence.   Objective  Right Ear x 1: Erythematous keratotic or waxy stuck-on papule or plaque.   Objective  Scalp x 1: Pink scaly macules   Objective  bil feet/toenails: Scaling bil feet, toenail dystrophy   Assessment & Plan    Lentigines - Scattered tan macules - Due to sun exposure - Benign-appering, observe - Recommend daily broad spectrum sunscreen SPF 30+ to sun-exposed areas, reapply every 2 hours as needed. - Call for any changes  Seborrheic Keratoses - Stuck-on, waxy, tan-brown papules and/or plaques  - Benign-appearing - Discussed benign etiology and prognosis. - Observe - Call for any changes  Melanocytic Nevi - Tan-brown and/or pink-flesh-colored symmetric macules and papules - Benign appearing on exam today -  Observation - Call clinic for new or changing moles - Recommend daily use of broad spectrum spf 30+ sunscreen to sun-exposed areas.   Hemangiomas - Red papules - Discussed benign nature - Observe - Call for any changes  Actinic Damage - Chronic condition, secondary to cumulative UV/sun exposure - diffuse scaly erythematous macules with underlying dyspigmentation - Recommend daily broad spectrum sunscreen SPF 30+ to sun-exposed areas, reapply every 2 hours as needed.  - Staying in the shade or wearing long sleeves, sun glasses (UVA+UVB protection) and wide brim hats (4-inch brim around the entire circumference of the hat) are also recommended for sun protection.  - Call for new or changing lesions.  Skin cancer screening performed today.  History of basal cell carcinoma (BCC) R post auricular neck Clear. Observe for recurrence. Call clinic for new or changing lesions.  Recommend regular skin exams, daily broad-spectrum spf 30+ sunscreen use, and photoprotection.     History of dysplastic nevus Right med pectoral Clear. Observe for recurrence. Call clinic for new or changing lesions.  Recommend regular skin exams, daily broad-spectrum spf 30+ sunscreen use, and photoprotection.     Inflamed seborrheic keratosis Right Ear x 1 Destruction of lesion - Right Ear x 1 Complexity: simple   Destruction method: cryotherapy   Informed consent: discussed and consent obtained   Timeout:  patient name, date of birth, surgical site, and procedure verified Lesion destroyed using liquid nitrogen: Yes   Region frozen until ice ball extended beyond lesion: Yes   Outcome: patient tolerated procedure well with no complications   Post-procedure details: wound care  instructions given    AK (actinic keratosis) Scalp x 1 Destruction of lesion - Scalp x 1 Complexity: simple   Destruction method: cryotherapy   Informed consent: discussed and consent obtained   Timeout:  patient name, date of birth,  surgical site, and procedure verified Lesion destroyed using liquid nitrogen: Yes   Region frozen until ice ball extended beyond lesion: Yes   Outcome: patient tolerated procedure well with no complications   Post-procedure details: wound care instructions given    Tinea unguium and tinea pedis bil feet/toenails Chronic and persistent -severe No history of liver disease Start Lamisil 250mg  1 po qd #30 0rf terbinafine (LAMISIL) 250 MG tablet - bil feet/toenails We discussed using Lamisil for onychomycosis. This drug offers a fairly high but not universal cure rate. A 12 week treatment course is recommended. The patient is aware that rare cases of liver injury have been reported; and agrees to come in for liver function tests at 6 weeks of treatment. The symptoms of liver disease have been discussed; call if such occurs. In addition, some insurance plans do not cover the expense of this drug for treating a cosmetic condition, and the patient understands they may have to pay for the medication. Other side effects, such as headaches and rashes, have also been discussed.  Skin cancer screening  Return in about 1 month (around 06/27/2020) for tinea.  I, Paul Acosta, [name], am acting as scribe for Paul Ser, MD .  Documentation: I have reviewed the above documentation for accuracy and completeness, and I agree with the above.  Paul Ser, MD

## 2020-06-01 ENCOUNTER — Encounter: Payer: Self-pay | Admitting: Dermatology

## 2020-06-03 ENCOUNTER — Telehealth: Payer: Self-pay | Admitting: Cardiovascular Disease

## 2020-06-03 NOTE — Telephone Encounter (Signed)
Patient called this morning to request an authorization. Patient e-mailed the request back. HIM called patient back to confirm where to send secure records. Patient informed HIM he would like them e-mailed to him instead. Records were e-mailed to the patient to the e-mail on file. AO 06/03/20

## 2020-06-07 DIAGNOSIS — H9313 Tinnitus, bilateral: Secondary | ICD-10-CM | POA: Diagnosis not present

## 2020-06-07 DIAGNOSIS — H903 Sensorineural hearing loss, bilateral: Secondary | ICD-10-CM | POA: Diagnosis not present

## 2020-06-07 DIAGNOSIS — R2681 Unsteadiness on feet: Secondary | ICD-10-CM | POA: Diagnosis not present

## 2020-06-24 ENCOUNTER — Other Ambulatory Visit: Payer: Self-pay

## 2020-06-24 ENCOUNTER — Ambulatory Visit (INDEPENDENT_AMBULATORY_CARE_PROVIDER_SITE_OTHER): Payer: Medicare Other | Admitting: Dermatology

## 2020-06-24 DIAGNOSIS — B353 Tinea pedis: Secondary | ICD-10-CM

## 2020-06-24 DIAGNOSIS — B351 Tinea unguium: Secondary | ICD-10-CM

## 2020-06-24 DIAGNOSIS — I2510 Atherosclerotic heart disease of native coronary artery without angina pectoris: Secondary | ICD-10-CM | POA: Diagnosis not present

## 2020-06-24 MED ORDER — TERBINAFINE HCL 250 MG PO TABS: 250.0000 mg | ORAL_TABLET | Freq: Every day | ORAL | 1 refills | Status: DC

## 2020-06-24 NOTE — Patient Instructions (Addendum)
If you have any questions or concerns for your doctor, please call our main line at [phone] and press option 4 to reach your doctor's medical assistant. If no one answers, please leave a voicemail as directed and we will return your call as soon as possible. Messages left after 4 pm will be answered the following business day.   You may also send us a message via MyChart. We typically respond to MyChart messages within 1-2 business days.  For prescription refills, please ask your pharmacy to contact our office. Our fax number is [phone].  If you have an urgent issue when the clinic is closed that cannot wait until the next business day, you can page your doctor at the number below.    Please note that while we do our best to be available for urgent issues outside of office hours, we are not available 24/7.   If you have an urgent issue and are unable to reach us, you may choose to seek medical care at your doctor's office, retail clinic, urgent care center, or emergency room.  If you have a medical emergency, please immediately call 911 or go to the emergency department.  Pager Numbers  - Dr. Kowalski: [phone]  - Dr. Moye: [phone]  - Dr. Stewart: [phone]  In the event of inclement weather, please call our main line at [phone] for an update on the status of any delays or closures.  Dermatology Medication Tips: Please keep the boxes that topical medications come in in order to help keep track of the instructions about where and how to use these. Pharmacies typically print the medication instructions only on the boxes and not directly on the medication tubes.   If your medication is too expensive, please contact our office at [phone] option 4 or send us a message through MyChart.   We are unable to tell what your co-pay for medications will be in advance as this is different depending on your insurance coverage. However, we may be able to find a substitute  medication at lower cost or fill out paperwork to get insurance to cover a needed medication.   If a prior authorization is required to get your medication covered by your insurance company, please allow us 1-2 business days to complete this process.  Drug prices often vary depending on where the prescription is filled and some pharmacies may offer cheaper prices.  The website www.goodrx.com contains coupons for medications through different pharmacies. The prices here do not account for what the cost may be with help from insurance (it may be cheaper with your insurance), but the website can give you the price if you did not use any insurance.  - You can print the associated coupon and take it with your prescription to the pharmacy.  - You may also stop by our office during regular business hours and pick up a GoodRx coupon card.  - If you need your prescription sent electronically to a different pharmacy, notify our office through [facility] MyChart or by phone at [phone] option 4.   Terbinafine Counseling  Terbinafine is an anti-fungal medicine that can be applied to the skin (over the counter) or taken by mouth (prescription) to treat fungal infections. The pill version is often used to treat fungal infections of the nails or scalp. While most people do not have any side effects from taking terbinafine pills, some possible side effects of the medicine can include taste changes, headache, loss of smell, vision changes, nausea, vomiting,   or diarrhea.   Rare side effects can include irritation of the liver, allergic reaction, or decrease in blood counts (which may show up as not feeling well or developing an infection). If you are concerned about any of these side effects, please stop the medicine and call your doctor, or in the case of an emergency such as feeling very unwell, seek immediate medical care.    

## 2020-06-24 NOTE — Progress Notes (Signed)
   Follow-Up Visit   Subjective  Paul Acosta is a 70 y.o. male who presents for the following: tinea pedis and unguium  (S/P 1 mth of Terbinafine 250mg  po QD - patient tolerating medication well with no s.e.).   The following portions of the chart were reviewed this encounter and updated as appropriate:   Tobacco  Allergies  Meds  Problems  Med Hx  Surg Hx  Fam Hx      Review of Systems:  No other skin or systemic complaints except as noted in HPI or Assessment and Plan.  Objective  Well appearing patient in no apparent distress; mood and affect are within normal limits.  A focused examination was performed including the feet. Relevant physical exam findings are noted in the Assessment and Plan.  B/L feet and toenails Some scale of the feet, clearing at the base of some of the nails.   Assessment & Plan  Tinea unguium B/L feet and toenails With tinea pedis -  Chronic and persistent and severe.  Improving with treatment.  Not to goal. Continue Lamisil 250mg  po QD #30 1RF. Terbinafine Counseling  Terbinafine is an anti-fungal medicine that can be applied to the skin (over the counter) or taken by mouth (prescription) to treat fungal infections. The pill version is often used to treat fungal infections of the nails or scalp. While most people do not have any side effects from taking terbinafine pills, some possible side effects of the medicine can include taste changes, headache, loss of smell, vision changes, nausea, vomiting, or diarrhea.   Rare side effects can include irritation of the liver, allergic reaction, or decrease in blood counts (which may show up as not feeling well or developing an infection). If you are concerned about any of these side effects, please stop the medicine and call your doctor, or in the case of an emergency such as feeling very unwell, seek immediate medical care.    terbinafine (LAMISIL) 250 MG tablet - B/L feet and toenails Take 1 tablet  (250 mg total) by mouth daily.  Related Medications terbinafine (LAMISIL) 250 MG tablet Take 1 tablet (250 mg total) by mouth daily.  Return in about 11 months (around 05/24/2021) for TBSE.  Luther Redo, CMA, am acting as scribe for [name], MD .  Documentation: I have reviewed the above documentation for accuracy and completeness, and I agree with the above.  [name], MD

## 2020-06-28 ENCOUNTER — Encounter: Payer: Self-pay | Admitting: Dermatology

## 2020-10-14 DIAGNOSIS — Z23 Encounter for immunization: Secondary | ICD-10-CM | POA: Diagnosis not present

## 2020-12-12 ENCOUNTER — Other Ambulatory Visit: Payer: Self-pay | Admitting: Cardiovascular Disease

## 2020-12-14 DIAGNOSIS — I10 Essential (primary) hypertension: Secondary | ICD-10-CM | POA: Diagnosis not present

## 2020-12-14 DIAGNOSIS — Z125 Encounter for screening for malignant neoplasm of prostate: Secondary | ICD-10-CM | POA: Diagnosis not present

## 2020-12-14 DIAGNOSIS — M25552 Pain in left hip: Secondary | ICD-10-CM | POA: Diagnosis not present

## 2020-12-14 DIAGNOSIS — J309 Allergic rhinitis, unspecified: Secondary | ICD-10-CM | POA: Diagnosis not present

## 2020-12-14 DIAGNOSIS — I2510 Atherosclerotic heart disease of native coronary artery without angina pectoris: Secondary | ICD-10-CM | POA: Diagnosis not present

## 2020-12-14 DIAGNOSIS — I499 Cardiac arrhythmia, unspecified: Secondary | ICD-10-CM | POA: Diagnosis not present

## 2020-12-14 DIAGNOSIS — N529 Male erectile dysfunction, unspecified: Secondary | ICD-10-CM | POA: Diagnosis not present

## 2020-12-14 DIAGNOSIS — Z1389 Encounter for screening for other disorder: Secondary | ICD-10-CM | POA: Diagnosis not present

## 2020-12-14 DIAGNOSIS — Z85828 Personal history of other malignant neoplasm of skin: Secondary | ICD-10-CM | POA: Diagnosis not present

## 2020-12-14 DIAGNOSIS — E7800 Pure hypercholesterolemia, unspecified: Secondary | ICD-10-CM | POA: Diagnosis not present

## 2020-12-14 DIAGNOSIS — Z0000 Encounter for general adult medical examination without abnormal findings: Secondary | ICD-10-CM | POA: Diagnosis not present

## 2020-12-21 ENCOUNTER — Ambulatory Visit (INDEPENDENT_AMBULATORY_CARE_PROVIDER_SITE_OTHER): Payer: Medicare Other | Admitting: Physician Assistant

## 2020-12-21 ENCOUNTER — Other Ambulatory Visit: Payer: Self-pay

## 2020-12-21 ENCOUNTER — Encounter: Payer: Self-pay | Admitting: Physician Assistant

## 2020-12-21 ENCOUNTER — Telehealth: Payer: Self-pay | Admitting: *Deleted

## 2020-12-21 DIAGNOSIS — I4819 Other persistent atrial fibrillation: Secondary | ICD-10-CM

## 2020-12-21 DIAGNOSIS — E785 Hyperlipidemia, unspecified: Secondary | ICD-10-CM | POA: Insufficient documentation

## 2020-12-21 DIAGNOSIS — I10 Essential (primary) hypertension: Secondary | ICD-10-CM

## 2020-12-21 DIAGNOSIS — E7800 Pure hypercholesterolemia, unspecified: Secondary | ICD-10-CM

## 2020-12-21 DIAGNOSIS — I2510 Atherosclerotic heart disease of native coronary artery without angina pectoris: Secondary | ICD-10-CM

## 2020-12-21 MED ORDER — APIXABAN 5 MG PO TABS: 5.0000 mg | ORAL_TABLET | Freq: Two times a day (BID) | ORAL | 3 refills | Status: DC

## 2020-12-21 NOTE — Assessment & Plan Note (Signed)
LDL in March 2022 was 48.  Continue rosuvastatin 40 mg daily.

## 2020-12-21 NOTE — Assessment & Plan Note (Addendum)
History of non-STEMI in April 2021 treated with a DES to the LAD.  EF has been preserved.  He had mild to moderate nonobstructive disease elsewhere.  As he requires anticoagulation with Apixaban, I will stop his aspirin.  He has not been on beta-blocker secondary to conduction system disease.  Continue rosuvastatin 40 mg daily.

## 2020-12-21 NOTE — Telephone Encounter (Signed)
S/w PCP Dr.Reade's office to get abnormal EKG faxed to office.

## 2020-12-21 NOTE — Patient Instructions (Signed)
Medication Instructions:   DISCONTINUE Asprin when you start Eliquis.  START Eliquis one (1) tablet by mouth ( 5 mg) twice daily.  *If you need a refill on your cardiac medications before your next appointment, please call your pharmacy*   Lab Work:  Your physician recommends that you return for lab work BMET/CBC. You can come in on the day of your appointment anytime between 7:30-4:30. Monday, January 16.   If you have labs (blood work) drawn today and your tests are completely normal, you will receive your results only by: [place] (if you have MyChart) OR A paper copy in the mail If you have any lab test that is abnormal or we need to change your treatment, we will call you to review the results.   Testing/Procedures:  NONE   Follow-Up: At [facility], you and your health needs are our priority.  As part of our continuing mission to provide you with exceptional heart care, we have created designated Provider Care Teams.  These Care Teams include your primary Cardiologist (physician) and Advanced Practice Providers (APPs -  Physician Assistants and Nurse Practitioners) who all work together to provide you with the care you need, when you need it.  We recommend signing up for the patient portal called "MyChart".  Sign up information is provided on this After Visit Summary.  MyChart is used to connect with patients for Virtual Visits (Telemedicine).  Patients are able to view lab/test results, encounter notes, upcoming appointments, etc.  Non-urgent messages can be sent to your provider as well.   To learn more about what you can do with MyChart, go to [email or web address].    Your next appointment:   3 month(s)  The format for your next appointment:   In Person  Provider:   Sherren Mocha, MD     Other Instructions AFIB CLINIC INFORMATION: Your appointment is scheduled on: Wednesday, December 28 at 11:30 am. Please arrive 15 minutes early for check-in. The  AFib Clinic is located in the Heart and Vascular Specialty Clinics at [facility]. Parking instructions/directions: [address] (off [address]). When you pull in to Entrance C, there is an underground parking garage to your right. The code to enter the garage is 5555. Take the elevators to the first floor. Follow the signs to the Heart and Vascular Specialty Clinics. You will see registration at the end of the hallway.  Phone number: [phone]

## 2020-12-21 NOTE — Assessment & Plan Note (Signed)
Repeat blood pressure by me 130/80.  He notes that this is a stressful time of year.  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  This will last through Christmas.  Continue amlodipine 5 mg daily, losartan 50 mg daily.  If blood pressure runs above target, consider increasing dose of losartan or amlodipine.

## 2020-12-21 NOTE — Progress Notes (Addendum)
Cardiology Office Note:    Date:  12/21/2020   ID:  CARSIN RANDAZZO, DOB [birth year], MRN [id number]  PCP:  Maury Dus, MD   [facility] Providers Cardiologist:  Sherren Mocha, MD    Referring MD: Maury Dus, MD   Chief Complaint:  Abnormal ECG    Patient Profile:   Paul Acosta is a 70 y.o. male with:  Coronary artery disease  NSTEMI 4/21 s/p DES to LAD AS HK w/ preserved EF Echocardiogram 3/22: EF 55-60, mild LVH, no RWMA, LAE Dilated aortic root Echocardiogram 3/22: 42 mm; asc aorta 41 mm Hypertension  Hyperlipidemia   History of Present Illness: Mr. Paul Acosta was last seen by Dr. Burt Knack in 4/22.  His ticagrelor was stopped at that time as the patient was greater than 1 year from his myocardial infarction.  He returns for further evaluation of an abnormal EKG.  He saw his PCP last week and was told his EKG showed atrial fibrillation.  However, upon further review, it was felt that his EKG demonstrated sinus rhythm.  The patient decided to follow-up here.  He does have an apple watch.  He has been notified several times that he is in atrial fibrillation.  He has not had chest pain, shortness of breath, syncope, orthopnea, fatigue, palpitations.  He has chronic right ankle edema secondary to previous injury.    His electrocardiogram in the office today does demonstrate atrial fibrillation with controlled ventricular rate.  ASSESSMENT & PLAN:   Persistent atrial fibrillation ([place]) New onset atrial fibrillation.  Rate is controlled.  He has been getting alarms from his apple watch notifying him that he is in atrial fibrillation.  His rate is controlled.  Overall, he is asymptomatic.  He does not really have any symptoms consistent with sleep apnea.  At the time of his myocardial infarction, he was not placed on beta-blocker due to conduction system disease.  I have recommended that we place him on anticoagulation and follow-up in the atrial fibrillation clinic in 3 weeks.  If he  remains in atrial fibrillation at that time, we can proceed with cardioversion.  -Start apixaban 5 mg twice daily  -DC ASA  -Arrange BMET, CBC in 6 to 8 weeks  -Follow-up in A. fib clinic in 3 weeks  -Follow-up with me or Dr. Burt Knack in 3 months  Coronary artery disease History of non-STEMI in April 2021 treated with a DES to the LAD.  EF has been preserved.  He had mild to moderate nonobstructive disease elsewhere.  As he requires anticoagulation with Apixaban, I will stop his aspirin.  He has not been on beta-blocker secondary to conduction system disease.  Continue rosuvastatin 40 mg daily.  HTN (hypertension) Repeat blood pressure by me 130/80.  He notes that this is a stressful time of year.  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  This will last through Christmas.  Continue amlodipine 5 mg daily, losartan 50 mg daily.  If blood pressure runs above target, consider increasing dose of losartan or amlodipine.  Hyperlipidemia LDL in March 2022 was 48.  Continue rosuvastatin 40 mg daily.           Dispo:  Return in about 3 months (around 03/21/2021) for Routine Follow Up, w/ Dr. Burt Knack.    Prior CV studies: Echocardiogram 04/14/2020 EF 55-60, no RWMA, mild concentric LVH, normal RVSF, mild LAE, trivial MR, aortic root 42 mm, ascending aorta  41 mm  Cardiac catheterization 04/21/2019 LAD proximal 95, mid 80, 30 LCx ostial 20, proximal 40, mid 20 RCA proximal 20 PCI: 3 x 32 mm Synergy DES to the LAD     Past Medical History:  Diagnosis Date   Basal cell carcinoma 01/28/2015   R post auricular neck - ED&C    Dysplastic nevus 07/27/2009   R med pectoral - mild    Hypertension    Medical history non-contributory    Current Medications: Current Meds  Medication Sig   amLODipine (NORVASC) 5 MG tablet TAKE ONE TABLET BY MOUTH DAILY   apixaban (ELIQUIS) 5 MG TABS tablet Take 1 tablet (5 mg total) by  mouth 2 (two) times daily.   Cyanocobalamin (B-12 PO) Take 1 tablet by mouth daily.   fluticasone (FLONASE) 50 MCG/ACT nasal spray Place 1 spray into both nostrils daily as needed for allergies or rhinitis.   ketoconazole (NIZORAL) 2 % cream Apply to affected areas at scalp three times weekly at bedtime.   losartan (COZAAR) 50 MG tablet Take 50 mg by mouth daily.   meloxicam (MOBIC) 15 MG tablet Take 15 mg by mouth daily.   Multiple Vitamins-Minerals (ZINC PO) Take 1 tablet by mouth daily.   nitroGLYCERIN (NITROSTAT) 0.4 MG SL tablet Place 1 tablet (0.4 mg total) under the tongue every 5 (five) minutes as needed for chest pain.   rosuvastatin (CRESTOR) 40 MG tablet TAKE ONE TABLET BY MOUTH DAILY AT 6 IN THE EVENING   [DISCONTINUED] aspirin EC 81 MG tablet Take 81 mg by mouth daily.    Allergies:   Patient has no known allergies.   Social History   Tobacco Use   Smoking status: Never   Smokeless tobacco: Never  Substance Use Topics   Alcohol use: Yes    Alcohol/week: 2.0 standard drinks    Types: 2 Cans of beer per week    Family Hx: The patient's family history is not on file.  Review of Systems  Respiratory:  Negative for snoring.   Gastrointestinal:  Negative for hematochezia.  Genitourinary:  Negative for hematuria.    EKGs/Labs/Other Test Reviewed:    EKG:  EKG is   ordered today.  The ekg ordered today demonstrates atrial fibrillation, HR 82, normal axis, right bundle branch block, QTC 481  Recent Labs: 04/14/2020: ALT 30; BUN 19; Creatinine, Ser 1.02; Hemoglobin 14.2; Platelets 187; Potassium 4.5; Sodium 140   Recent Lipid Panel Lab Results  Component Value Date/Time   CHOL 112 04/14/2020 07:33 AM   TRIG 63 04/14/2020 07:33 AM   HDL 50 04/14/2020 07:33 AM   LDLCALC 48 04/14/2020 07:33 AM     Risk Assessment/Calculations:    CHA2DS2-VASc Score = 3   This indicates a 3.2% annual risk of stroke. The patient's score is based upon: CHF History: 0 HTN History:  1 Diabetes History: 0 Stroke History: 0 Vascular Disease History: 1 Age Score: 1 Gender Score: 0        Physical Exam:    VS:  BP (!) 160/82   Pulse 82   Ht 6\' 6"  (1.981 m)   Wt 258 lb 3.2 oz (117.1 kg)   SpO2 97%   BMI 29.84 kg/m     Wt Readings from Last 3 Encounters:  12/21/20 258 lb 3.2 oz (117.1 kg)  04/16/20 246 lb 3.2 oz (111.7 kg)  11/05/19 230 lb (104.3 kg)    Constitutional:      Appearance: Healthy appearance. Not in distress.  Neck:  Vascular: No JVR. JVD normal.  Pulmonary:     Effort: Pulmonary effort is normal.     Breath sounds: No wheezing. No rales.  Cardiovascular:     Normal rate. Regular rhythm. Normal S1. Normal S2.      Murmurs: There is no murmur.  Edema:    Peripheral edema present.    Ankle: 1+ edema of the right ankle. Abdominal:     Palpations: Abdomen is soft. There is no hepatomegaly.  Skin:    General: Skin is warm and dry.  Neurological:     General: No focal deficit present.     Mental Status: Alert and oriented to person, place and time.     Cranial Nerves: Cranial nerves are intact.       Medication Adjustments/Labs and Tests Ordered: Current medicines are reviewed at length with the patient today.  Concerns regarding medicines are outlined above.  Tests Ordered: Orders Placed This Encounter  Procedures   Basic Metabolic Panel (BMET)   CBC   Amb Referral to AFIB Clinic   EKG 12-Lead    Medication Changes: Meds ordered this encounter  Medications   apixaban (ELIQUIS) 5 MG TABS tablet    Sig: Take 1 tablet (5 mg total) by mouth 2 (two) times daily.    Dispense:  180 tablet    Refill:  3    Signed, Richardson Dopp, PA-C  12/21/2020 4:56 PM    [facility] [place], [place], [place]  [address] Phone: [phone]; Fax: [phone]

## 2020-12-21 NOTE — Assessment & Plan Note (Addendum)
New onset atrial fibrillation.  Rate is controlled.  He has been getting alarms from his apple watch notifying him that he is in atrial fibrillation.  His rate is controlled.  Overall, he is asymptomatic.  He does not really have any symptoms consistent with sleep apnea.  At the time of his myocardial infarction, he was not placed on beta-blocker due to conduction system disease.  I have recommended that we place him on anticoagulation and follow-up in the atrial fibrillation clinic in 3 weeks.  If he remains in atrial fibrillation at that time, we can proceed with cardioversion.  -Start apixaban 5 mg twice daily  -DC ASA  -Arrange BMET, CBC in 6 to 8 weeks  -Follow-up in A. fib clinic in 3 weeks  -Follow-up with me or Dr. Burt Knack in 3 months

## 2020-12-22 NOTE — Telephone Encounter (Signed)
Received faxed EKG.

## 2021-01-11 NOTE — Progress Notes (Signed)
Primary Care Physician: Maury Dus, MD Primary Cardiologist: Dr Burt Knack Primary Electrophysiologist: none Referring Physician: Richardson Dopp PA   Paul Acosta is a 70 y.o. male with a history of CAD, HTN, HLD, atrial fibrillation who presents for consultation in the [facility]. The patient was initially diagnosed with atrial fibrillation 12/21/20 after presenting to [facility] with his smart watch alerting him that he was in afib. He was not symptomatic with his arrhythmia. He was on prednisone at the time. Patient was started on Eliquis for a CHADS2VASC score of 3. Since stopping prednisone, he has not had any further afib on his smart watch. He is in SR today. He denies significant snoring and only drinks alcohol occasionally.   Today, he denies symptoms of palpitations, chest pain, shortness of breath, orthopnea, PND, lower extremity edema, dizziness, presyncope, syncope, snoring, daytime somnolence, bleeding, or neurologic sequela. The patient is tolerating medications without difficulties and is otherwise without complaint today.    Atrial Fibrillation Risk Factors:  he does not have symptoms or diagnosis of sleep apnea. he does not have a history of rheumatic fever. he does have a history of alcohol use. The patient does have a history of early familial atrial fibrillation or other arrhythmias. Brother has afib.  he has a BMI of Body mass index is 29.49 kg/m.Marland Kitchen Filed Weights   01/12/21 1049  Weight: 115.8 kg    No family history on file.   Atrial Fibrillation Management history:  Previous antiarrhythmic drugs: none Previous cardioversions: none Previous ablations: none CHADS2VASC score: 3 Anticoagulation history: Eliquis   Past Medical History:  Diagnosis Date   Basal cell carcinoma 01/28/2015   R post auricular neck - ED&C    Dysplastic nevus 07/27/2009   R med pectoral - mild    Hypertension    Medical history non-contributory     Past Surgical History:  Procedure Laterality Date   EYE SURGERY     LEFT HEART CATH AND CORONARY ANGIOGRAPHY N/A 04/21/2019   Procedure: LEFT HEART CATH AND CORONARY ANGIOGRAPHY;  Surgeon: Troy Sine, MD;  Location: [facility];  Service: Cardiovascular;  Laterality: N/A;   TOTAL HIP ARTHROPLASTY Right 11/07/2013   Procedure: RIGHT TOTAL HIP ARTHROPLASTY;  Surgeon: Kerin Salen, MD;  Location: [facility];  Service: Orthopedics;  Laterality: Right;    Current Outpatient Medications  Medication Sig Dispense Refill   amLODipine (NORVASC) 5 MG tablet TAKE ONE TABLET BY MOUTH DAILY 90 tablet 2   apixaban (ELIQUIS) 5 MG TABS tablet Take 1 tablet (5 mg total) by mouth 2 (two) times daily. 180 tablet 3   Cyanocobalamin (B-12 PO) Take 1 tablet by mouth daily.     fluticasone (FLONASE) 50 MCG/ACT nasal spray Place 1 spray into both nostrils daily as needed for allergies or rhinitis.     ketoconazole (NIZORAL) 2 % cream Apply to affected areas at scalp three times weekly at bedtime. 30 g 5   losartan (COZAAR) 50 MG tablet Take 50 mg by mouth daily.     meloxicam (MOBIC) 15 MG tablet Take 15 mg by mouth daily.     Multiple Vitamins-Minerals (ZINC PO) Take 1 tablet by mouth daily.     nitroGLYCERIN (NITROSTAT) 0.4 MG SL tablet Place 1 tablet (0.4 mg total) under the tongue every 5 (five) minutes as needed for chest pain. 30 tablet 0   rosuvastatin (CRESTOR) 40 MG tablet TAKE ONE TABLET BY MOUTH DAILY AT 6 IN THE EVENING  90 tablet 3   No current facility-administered medications for this encounter.    No Known Allergies  Social History   Socioeconomic History   Marital status: Married    Spouse name: Not on file   Number of children: Not on file   Years of education: Not on file   Highest education level: Not on file  Occupational History   Not on file  Tobacco Use   Smoking status: Never   Smokeless tobacco: Never  Substance and Sexual Activity   Alcohol use: Yes     Alcohol/week: 4.0 - 5.0 standard drinks    Types: 4 - 5 Cans of beer per week    Comment: once a week 01/12/21   Drug use: Never   Sexual activity: Not on file  Other Topics Concern   Not on file  Social History Narrative   Not on file   Social Determinants of Health   Financial Resource Strain: Not on file  Food Insecurity: Not on file  Transportation Needs: Not on file  Physical Activity: Not on file  Stress: Not on file  Social Connections: Not on file  Intimate Partner Violence: Not on file     ROS- All systems are reviewed and negative except as per the HPI above.  Physical Exam: Vitals:   01/12/21 1049  BP: (!) 150/88  Pulse: 69  Weight: 115.8 kg  Height: 6\' 6"  (1.981 m)    GEN- The patient is a well appearing male, alert and oriented x 3 today.   Head- normocephalic, atraumatic Eyes-  Sclera clear, conjunctiva pink Ears- hearing intact Oropharynx- clear Neck- supple  Lungs- Clear to ausculation bilaterally, normal work of breathing Heart- Regular rate and rhythm, no murmurs, rubs or gallops  GI- soft, NT, ND, + BS Extremities- no clubbing, cyanosis, or edema MS- no significant deformity or atrophy Skin- no rash or lesion Psych- euthymic mood, full affect Neuro- strength and sensation are intact  Wt Readings from Last 3 Encounters:  01/12/21 115.8 kg  12/21/20 117.1 kg  04/16/20 111.7 kg    EKG today demonstrates  SR, RBBB Vent. rate 69 BPM PR interval 196 ms QRS duration 144 ms QT/QTcB 438/469 ms  Echo 04/14/20 demonstrated   1. Left ventricular ejection fraction, by estimation, is 55 to 60%. Left  ventricular ejection fraction by PLAX is 59 %. The left ventricle has  normal function. The left ventricle has no regional wall motion  abnormalities. There is mild concentric left ventricular hypertrophy. Left ventricular diastolic parameters were normal.   2. Right ventricular systolic function is normal. The right ventricular  size is normal.    3. Left atrial size was mildly dilated.   4. The mitral valve is normal in structure. Trivial mitral valve  regurgitation. No evidence of mitral stenosis.   5. The aortic valve is tricuspid. Aortic valve regurgitation is not  visualized. No aortic stenosis is present.   6. Aortic dilatation noted. There is mild dilatation of the aortic root,  measuring 42 mm. There is mild dilatation of the ascending aorta,  measuring 41 mm.   7. The inferior vena cava is normal in size with greater than 50%  respiratory variability, suggesting right atrial pressure of 3 mmHg.   Epic records are reviewed at length today  CHA2DS2-VASc Score = 3  The patient's score is based upon: CHF History: 0 HTN History: 1 Diabetes History: 0 Stroke History: 0 Vascular Disease History: 1 Age Score: 1 Gender Score: 0  ASSESSMENT AND PLAN: 1. Paroxysmal atrial fibrillation  The patient's CHA2DS2-VASc score is 3, indicating a 3.2% annual risk of stroke.   Patient back in SR. Suspect related to prednisone use.  Continue Eliquis 5 mg BID Patient not on rate control given underlying conduction disease and good rate control while in afib.  Continue to monitor with smart watch. If burden increases, he may be interested in front-line ablation.   2. Secondary Hypercoagulable State (ICD10:  D68.69) The patient is at significant risk for stroke/thromboembolism based upon his CHA2DS2-VASc Score of 3.  Continue Apixaban (Eliquis).   3. CAD No anginal symptoms.  4. HTN Mildly elevated today, patient reports good BP control at home and at work and checks frequently. Will not make changes today.    Follow up with Dr Burt Knack as scheduled.    [facility] [address] [place], [place] [address] [phone] 01/12/2021 11:53 AM

## 2021-01-12 ENCOUNTER — Other Ambulatory Visit: Payer: Self-pay

## 2021-01-12 ENCOUNTER — Ambulatory Visit (HOSPITAL_COMMUNITY)
Admission: RE | Admit: 2021-01-12 | Discharge: 2021-01-12 | Disposition: A | Discharge status: Home / Self Care | Payer: Medicare Other | Source: Ambulatory Visit | Attending: Physician Assistant | Admitting: Physician Assistant

## 2021-01-12 ENCOUNTER — Encounter (HOSPITAL_COMMUNITY): Payer: Self-pay | Admitting: Physician Assistant

## 2021-01-12 VITALS — BP 150/88 | HR 69 | Ht 78.0 in | Wt 255.2 lb

## 2021-01-12 DIAGNOSIS — I480 Paroxysmal atrial fibrillation: Secondary | ICD-10-CM | POA: Diagnosis not present

## 2021-01-12 DIAGNOSIS — Z79899 Other long term (current) drug therapy: Secondary | ICD-10-CM | POA: Insufficient documentation

## 2021-01-12 DIAGNOSIS — I2510 Atherosclerotic heart disease of native coronary artery without angina pectoris: Secondary | ICD-10-CM | POA: Insufficient documentation

## 2021-01-12 DIAGNOSIS — E785 Hyperlipidemia, unspecified: Secondary | ICD-10-CM | POA: Diagnosis not present

## 2021-01-12 DIAGNOSIS — Z7901 Long term (current) use of anticoagulants: Secondary | ICD-10-CM | POA: Diagnosis not present

## 2021-01-12 DIAGNOSIS — I10 Essential (primary) hypertension: Secondary | ICD-10-CM | POA: Insufficient documentation

## 2021-01-12 DIAGNOSIS — D6869 Other thrombophilia: Secondary | ICD-10-CM | POA: Insufficient documentation

## 2021-01-31 ENCOUNTER — Other Ambulatory Visit: Payer: Medicare Other | Admitting: *Deleted

## 2021-01-31 ENCOUNTER — Other Ambulatory Visit: Payer: Self-pay

## 2021-01-31 DIAGNOSIS — I2510 Atherosclerotic heart disease of native coronary artery without angina pectoris: Secondary | ICD-10-CM

## 2021-01-31 DIAGNOSIS — I10 Essential (primary) hypertension: Secondary | ICD-10-CM

## 2021-01-31 DIAGNOSIS — I4819 Other persistent atrial fibrillation: Secondary | ICD-10-CM

## 2021-01-31 DIAGNOSIS — E7800 Pure hypercholesterolemia, unspecified: Secondary | ICD-10-CM | POA: Diagnosis not present

## 2021-01-31 LAB — CBC
Hematocrit: 43.9 % (ref 37.5–51.0)
Hemoglobin: 15 g/dL (ref 13.0–17.7)
MCH: 30.5 pg (ref 26.6–33.0)
MCHC: 34.2 g/dL (ref 31.5–35.7)
MCV: 89 fL (ref 79–97)
Platelets: 205 10*3/uL (ref 150–450)
RBC: 4.92 x10E6/uL (ref 4.14–5.80)
RDW: 12.8 % (ref 11.6–15.4)
WBC: 3.6 10*3/uL (ref 3.4–10.8)

## 2021-01-31 LAB — BASIC METABOLIC PANEL
BUN/Creatinine Ratio: 22 (ref 10–24)
BUN: 23 mg/dL (ref 8–27)
CO2: 24 mmol/L (ref 20–29)
Calcium: 9.5 mg/dL (ref 8.6–10.2)
Chloride: 103 mmol/L (ref 96–106)
Creatinine, Ser: 1.04 mg/dL (ref 0.76–1.27)
Glucose: 95 mg/dL (ref 70–99)
Potassium: 4.7 mmol/L (ref 3.5–5.2)
Sodium: 137 mmol/L (ref 134–144)
eGFR: 78 mL/min/{1.73_m2} (ref >59)

## 2021-02-01 DIAGNOSIS — M76892 Other specified enthesopathies of left lower limb, excluding foot: Secondary | ICD-10-CM | POA: Diagnosis not present

## 2021-02-01 DIAGNOSIS — M1612 Unilateral primary osteoarthritis, left hip: Secondary | ICD-10-CM | POA: Diagnosis not present

## 2021-02-07 DIAGNOSIS — M25552 Pain in left hip: Secondary | ICD-10-CM | POA: Diagnosis not present

## 2021-02-11 DIAGNOSIS — M1990 Unspecified osteoarthritis, unspecified site: Secondary | ICD-10-CM | POA: Diagnosis not present

## 2021-02-22 ENCOUNTER — Other Ambulatory Visit: Payer: Self-pay | Admitting: Orthopaedic Surgery

## 2021-02-22 DIAGNOSIS — M25511 Pain in right shoulder: Secondary | ICD-10-CM | POA: Diagnosis not present

## 2021-02-23 DIAGNOSIS — M79671 Pain in right foot: Secondary | ICD-10-CM | POA: Diagnosis not present

## 2021-02-23 DIAGNOSIS — M19079 Primary osteoarthritis, unspecified ankle and foot: Secondary | ICD-10-CM | POA: Diagnosis not present

## 2021-02-25 DIAGNOSIS — M1612 Unilateral primary osteoarthritis, left hip: Secondary | ICD-10-CM | POA: Diagnosis not present

## 2021-02-25 DIAGNOSIS — I2510 Atherosclerotic heart disease of native coronary artery without angina pectoris: Secondary | ICD-10-CM | POA: Diagnosis not present

## 2021-03-07 ENCOUNTER — Ambulatory Visit
Admission: RE | Admit: 2021-03-07 | Discharge: 2021-03-07 | Disposition: A | Discharge status: Home / Self Care | Payer: Medicare Other | Source: Ambulatory Visit | Attending: Orthopaedic Surgery | Admitting: Orthopaedic Surgery

## 2021-03-07 DIAGNOSIS — M25511 Pain in right shoulder: Secondary | ICD-10-CM

## 2021-03-09 ENCOUNTER — Encounter: Payer: Self-pay | Admitting: Cardiovascular Disease

## 2021-03-10 DIAGNOSIS — M25552 Pain in left hip: Secondary | ICD-10-CM | POA: Diagnosis not present

## 2021-03-10 DIAGNOSIS — R269 Unspecified abnormalities of gait and mobility: Secondary | ICD-10-CM | POA: Diagnosis not present

## 2021-03-23 ENCOUNTER — Ambulatory Visit: Payer: Medicare Other | Admitting: Physician Assistant

## 2021-03-31 ENCOUNTER — Other Ambulatory Visit: Payer: Self-pay

## 2021-03-31 ENCOUNTER — Encounter: Payer: Self-pay | Admitting: Dermatology

## 2021-03-31 MED ORDER — KETOCONAZOLE 2 % EX CREA: TOPICAL_CREAM | CUTANEOUS | 1 refills | Status: DC

## 2021-04-06 DIAGNOSIS — I4891 Unspecified atrial fibrillation: Secondary | ICD-10-CM | POA: Diagnosis not present

## 2021-04-06 DIAGNOSIS — I480 Paroxysmal atrial fibrillation: Secondary | ICD-10-CM | POA: Diagnosis not present

## 2021-04-06 DIAGNOSIS — E559 Vitamin D deficiency, unspecified: Secondary | ICD-10-CM | POA: Diagnosis not present

## 2021-04-06 DIAGNOSIS — M1612 Unilateral primary osteoarthritis, left hip: Secondary | ICD-10-CM | POA: Diagnosis not present

## 2021-04-06 DIAGNOSIS — I2510 Atherosclerotic heart disease of native coronary artery without angina pectoris: Secondary | ICD-10-CM | POA: Diagnosis not present

## 2021-04-06 DIAGNOSIS — I209 Angina pectoris, unspecified: Secondary | ICD-10-CM | POA: Diagnosis not present

## 2021-04-06 DIAGNOSIS — I214 Non-ST elevation (NSTEMI) myocardial infarction: Secondary | ICD-10-CM | POA: Diagnosis not present

## 2021-04-06 DIAGNOSIS — M25552 Pain in left hip: Secondary | ICD-10-CM | POA: Diagnosis not present

## 2021-04-11 DIAGNOSIS — M19079 Primary osteoarthritis, unspecified ankle and foot: Secondary | ICD-10-CM | POA: Diagnosis not present

## 2021-04-18 ENCOUNTER — Ambulatory Visit (INDEPENDENT_AMBULATORY_CARE_PROVIDER_SITE_OTHER): Payer: Medicare Other | Admitting: Cardiovascular Disease

## 2021-04-18 ENCOUNTER — Encounter: Payer: Self-pay | Admitting: Cardiovascular Disease

## 2021-04-18 ENCOUNTER — Ambulatory Visit (INDEPENDENT_AMBULATORY_CARE_PROVIDER_SITE_OTHER): Payer: Medicare Other

## 2021-04-18 VITALS — BP 122/90 | HR 56 | Ht 78.0 in | Wt 235.0 lb

## 2021-04-18 DIAGNOSIS — E782 Mixed hyperlipidemia: Secondary | ICD-10-CM

## 2021-04-18 DIAGNOSIS — I10 Essential (primary) hypertension: Secondary | ICD-10-CM | POA: Diagnosis not present

## 2021-04-18 DIAGNOSIS — Z01810 Encounter for preprocedural cardiovascular examination: Secondary | ICD-10-CM

## 2021-04-18 DIAGNOSIS — I480 Paroxysmal atrial fibrillation: Secondary | ICD-10-CM

## 2021-04-18 DIAGNOSIS — D6869 Other thrombophilia: Secondary | ICD-10-CM

## 2021-04-18 DIAGNOSIS — I2510 Atherosclerotic heart disease of native coronary artery without angina pectoris: Secondary | ICD-10-CM

## 2021-04-18 NOTE — Progress Notes (Signed)
?Cardiology Office Note:   ? ?Date:  04/18/2021  ? ?ID:  Paul Acosta, DOB 12-14-1950, MRN [id number] ? ?PCP:  Paul Dus, MD ?  ?[facility] Providers ?Cardiologist:  Paul Mocha, MD    ? ?Referring MD: Paul Dus, MD  ? ?Chief Complaint  ?Patient presents with  ? Atrial Fibrillation  ? ? ?History of Present Illness:   ? ?Paul Acosta is a 71 y.o. male with a hx of: ?Coronary artery disease  ?NSTEMI 4/21 s/p DES to LAD ?AS HK w/ preserved EF ?Echocardiogram 3/22: EF 55-60, mild LVH, no RWMA, LAE ?Dilated aortic root ?Echocardiogram 3/22: 42 mm; asc aorta 41 mm ?Hypertension  ?Hyperlipidemia  ?Paroxysmal atrial fibrillation ?Diagnosed 12/22, spontaneously back in sinus rhythm at follow-up in AF clinic ?Recurrent AF March 2023 ?Rate controlled without AV nodal agents ? ?The patient is here alone today.  He is doing well from a cardiac perspective.  He denies chest pain, shortness of breath, heart palpitations.  He is facing left hip replacement surgery later this month.  This will be done at [facility].  He will also need a right shoulder replacement at a later date.  When he was at [facility] about 1 week ago he was found to be in atrial fibrillation.  I reviewed the EKG which he has on his iPhone and this confirms atrial fibrillation.  Again, the patient remains asymptomatic with his atrial fibrillation. ? ?Past Medical History:  ?Diagnosis Date  ? Basal cell carcinoma 01/28/2015  ? R post auricular neck - ED&C   ? Dysplastic nevus 07/27/2009  ? R med pectoral - mild   ? Hypertension   ? Medical history non-contributory   ? ? ?Past Surgical History:  ?Procedure Laterality Date  ? EYE SURGERY    ? LEFT HEART CATH AND CORONARY ANGIOGRAPHY N/A 04/21/2019  ? Procedure: LEFT HEART CATH AND CORONARY ANGIOGRAPHY;  Surgeon: Troy Sine, MD;  Location: [place] [facility];  Service: Cardiovascular;  Laterality: N/A;  ? TOTAL HIP ARTHROPLASTY Right 11/07/2013  ? Procedure: RIGHT TOTAL HIP ARTHROPLASTY;  Surgeon:  Kerin Salen, MD;  Location: [place];  Service: Orthopedics;  Laterality: Right;  ? ? ?Current Medications: ?Current Meds  ?Medication Sig  ? acetaminophen (TYLENOL) 650 MG CR tablet Take by mouth.  ? amLODipine (NORVASC) 5 MG tablet TAKE ONE TABLET BY MOUTH DAILY  ? apixaban (ELIQUIS) 5 MG TABS tablet Take 1 tablet (5 mg total) by mouth 2 (two) times daily.  ? ascorbic acid (VITAMIN C) 1000 MG tablet Take by mouth.  ? Cholecalciferol (VITAMIN D3 GUMMIES) 25 MCG (1000 UT) CHEW Chew by mouth. Per patient taking 5000 units daily  ? Cyanocobalamin (B-12 PO) Take 1 tablet by mouth daily.  ? fluticasone (FLONASE) 50 MCG/ACT nasal spray Place 1 spray into both nostrils daily as needed for allergies or rhinitis.  ? ketoconazole (NIZORAL) 2 % cream Apply to affected areas at scalp three times weekly at bedtime.  ? losartan (COZAAR) 50 MG tablet Take 50 mg by mouth daily.  ? Multiple Vitamins-Minerals (ZINC PO) Take 1 tablet by mouth daily.  ? nitroGLYCERIN (NITROSTAT) 0.4 MG SL tablet Place 1 tablet (0.4 mg total) under the tongue every 5 (five) minutes as needed for chest pain.  ? rosuvastatin (CRESTOR) 40 MG tablet TAKE ONE TABLET BY MOUTH DAILY AT 6 IN THE EVENING  ? Turmeric 500 MG CAPS Take by mouth.  ? vitamin B-12 (CYANOCOBALAMIN) 250 MCG tablet Take by mouth.  ?  Zinc 100 MG TABS Take by mouth.  ?  ? ?Allergies:   Patient has no known allergies.  ? ?Social History  ? ?Socioeconomic History  ? Marital status: Married  ?  Spouse name: Not on file  ? Number of children: Not on file  ? Years of education: Not on file  ? Highest education level: Not on file  ?Occupational History  ? Not on file  ?Tobacco Use  ? Smoking status: Never  ? Smokeless tobacco: Never  ?Substance and Sexual Activity  ? Alcohol use: Yes  ?  Alcohol/week: 4.0 - 5.0 standard drinks  ?  Types: 4 - 5 Cans of beer per week  ?  Comment: once a week 01/12/21  ? Drug use: Never  ? Sexual activity: Not on file  ?Other Topics Concern  ? Not on file  ?Social  History Narrative  ? Not on file  ? ?Social Determinants of Health  ? ?Financial Resource Strain: Not on file  ?Food Insecurity: Not on file  ?Transportation Needs: Not on file  ?Physical Activity: Not on file  ?Stress: Not on file  ?Social Connections: Not on file  ?  ? ?Family History: ?The patient's family history is not on file. ? ?ROS:   ?Please see the history of present illness.    ?All other systems reviewed and are negative. ? ?EKGs/Labs/Other Studies Reviewed:   ? ?The following studies were reviewed today: ?Cardiac Cath 04/21/19: ?Prox LAD lesion is 95% stenosed. ?Mid LAD-1 lesion is 80% stenosed. ?Mid LAD-2 lesion is 30% stenosed. ?Ost Cx lesion is 20% stenosed. ?Prox Cx to Mid Cx lesion is 40% stenosed. ?Mid Cx to Dist Cx lesion is 20% stenosed. ?Prox RCA to Mid RCA lesion is 20% stenosed. ?A stent was successfully placed. ?Post intervention, there is a 0% residual stenosis. ?Post intervention, there is a 0% residual stenosis. ?  ?Multivessel CAD with 95% proximal LAD stenosis followed by 80% stenosis after the first septal perforating artery and 30% mid LAD stenosis in a large LAD vessel which wraps around the LV apex; 20% smooth ostial stenosis of the circumflex coronary artery followed by 40% somewhat eccentric proximal stenosis and 20% mid stenosis; and dominant RCA with 20% mid stenosis. ?  ?Successful percutaneous coronary intervention to the proximal to mid LAD lesions with ultimate insertion of a 3.0 x 32 mm Synergy DES stent postdilated to 3.25 mm with the 95 and 80% stenoses being reduced to 0%.  There is brisk TIMI-3 flow and no evidence for dissection. ?  ?LVEDP 31 mm Hg. ?  ?RECOMMENDATION: ?DAPT therapy for minimum of 12 months.  Medical therapy for mild concomitant CAD with optimal blood pressure lowering with target less than 130/80 and ideal less than 120/80, aggressive lipid-lowering therapy with target LDL less than 70. ? ?Echo 04/14/20: ?1. Left ventricular ejection fraction, by  estimation, is 55 to 60%. Left  ?ventricular ejection fraction by PLAX is 59 %. The left ventricle has  ?normal function. The left ventricle has no regional wall motion  ?abnormalities. There is mild concentric left  ?ventricular hypertrophy. Left ventricular diastolic parameters were  ?normal.  ? 2. Right ventricular systolic function is normal. The right ventricular  ?size is normal.  ? 3. Left atrial size was mildly dilated.  ? 4. The mitral valve is normal in structure. Trivial mitral valve  ?regurgitation. No evidence of mitral stenosis.  ? 5. The aortic valve is tricuspid. Aortic valve regurgitation is not  ?visualized. No aortic stenosis  is present.  ? 6. Aortic dilatation noted. There is mild dilatation of the aortic root,  ?measuring 42 mm. There is mild dilatation of the ascending aorta,  ?measuring 41 mm.  ? 7. The inferior vena cava is normal in size with greater than 50%  ?respiratory variability, suggesting right atrial pressure of 3 mmHg.  ? ?EKG:  EKG is not ordered today.   ? ?Recent Labs: ?01/31/2021: BUN 23; Creatinine, Ser 1.04; Hemoglobin 15.0; Platelets 205; Potassium 4.7; Sodium 137  ?Recent Lipid Panel ?   ?Component Value Date/Time  ? CHOL 112 04/14/2020 0733  ? TRIG 63 04/14/2020 0733  ? HDL 50 04/14/2020 0733  ? CHOLHDL 2.2 04/14/2020 0733  ? CHOLHDL 3.6 04/19/2019 1819  ? VLDL 33 04/19/2019 1819  ? Christine 48 04/14/2020 0733  ? ? ? ?Risk Assessment/Calculations:   ? ?CHA2DS2-VASc Score = 3  ? This indicates a 3.2% annual risk of stroke. ?The patient's score is based upon: ?CHF History: 0 ?HTN History: 1 ?Diabetes History: 0 ?Stroke History: 0 ?Vascular Disease History: 1 ?Age Score: 1 ?Gender Score: 0 ?  ? ? ?    ? ?Physical Exam:   ? ?VS:  BP 122/90   Pulse (!) 56   Ht '6\' 6"'$  (1.981 m)   Wt 235 lb (106.6 kg)   SpO2 97%   BMI 27.16 kg/m?    ? ?Wt Readings from Last 3 Encounters:  ?04/18/21 235 lb (106.6 kg)  ?01/12/21 255 lb 3.2 oz (115.8 kg)  ?12/21/20 258 lb 3.2 oz (117.1 kg)  ?   ? ?GEN:  Well nourished, well developed in no acute distress ?HEENT: Normal ?NECK: No JVD; No carotid bruits ?LYMPHATICS: No lymphadenopathy ?CARDIAC: irregularly irregular, no murmurs, rubs, gallops ?RESPIRATORY:  C

## 2021-04-18 NOTE — Progress Notes (Unsigned)
Enrolled for [employer] to mail a ZIO XT long term holter monitor to the patients address on file.  

## 2021-04-18 NOTE — Patient Instructions (Signed)
Medication Instructions:  ?Your physician recommends that you continue on your current medications as directed. Please refer to the Current Medication list given to you today. ? ?*If you need a refill on your cardiac medications before your next appointment, please call your pharmacy* ? ? ?Lab Work: ?NONE ?If you have labs (blood work) drawn today and your tests are completely normal, you will receive your results only by: ?MyChart Message (if you have MyChart) OR ?A paper copy in the mail ?If you have any lab test that is abnormal or we need to change your treatment, we will call you to review the results. ? ? ?Testing/Procedures: ?Zio patch monitor x 14 days ?Your physician has recommended that you wear an event monitor. Event monitors are medical devices that record the heart?s electrical activity. Doctors most often [place] these monitors to diagnose arrhythmias. Arrhythmias are problems with the speed or rhythm of the heartbeat. The monitor is a small, portable device. You can wear one while you do your normal daily activities. This is usually used to diagnose what is causing palpitations/syncope (passing out). ? ?Referral to Electrophysiology Cardiologist ? ? ?Follow-Up: ?At [facility], you and your health needs are our priority.  As part of our continuing mission to provide you with exceptional heart care, we have created designated Provider Care Teams.  These Care Teams include your primary Cardiologist (physician) and Advanced Practice Providers (APPs -  Physician Assistants and Nurse Practitioners) who all work together to provide you with the care you need, when you need it. ? ?Your next appointment:   ?6 month(s) ? ?The format for your next appointment:   ?In Person ? ?Provider:   ?Christen Bame, NP or Richardson Dopp, PA-C     Then, Sherren Mocha, MD will plan to see you again in 1 year(s). ?}  ?Other Instructions ?Clear for hip and shoulder surgery ? ? ?ZIO XT- Long Term Monitor Instructions ? ?Your  physician has requested you wear a ZIO patch monitor for 14 days.  ?This is a single patch monitor. [employer] supplies one patch monitor per enrollment. Additional ?stickers are not available. Please do not apply patch if you will be having a Nuclear Stress Test,  ?Echocardiogram, Cardiac CT, MRI, or Chest Xray during the period you would be wearing the  ?monitor. The patch cannot be worn during these tests. You cannot remove and re-apply the  ?ZIO XT patch monitor.  ?Your ZIO patch monitor will be mailed 3 day USPS to your address on file. It may take 3-5 days  ?to receive your monitor after you have been enrolled.  ?Once you have received your monitor, please review the enclosed instructions. Your monitor  ?has already been registered assigning a specific monitor serial # to you. ? ?Billing and Patient Assistance Program Information ? ?We have supplied [employer] with any of your insurance information on file for billing purposes. ?[employer] offers a sliding scale Patient Assistance Program for patients that do not have  ?insurance, or whose insurance does not completely cover the cost of the ZIO monitor.  ?You must apply for the Patient Assistance Program to qualify for this discounted rate.  ?To apply, please call [employer] at [phone], select option 4, select option 2, ask to apply for  ?Patient Assistance Program. Theodore Demark will ask your household income, and how many people  ?are in your household. They will quote your out-of-pocket cost based on that information.  ?[employer] will also be able to set up a 34-month interest-free payment plan  if needed. ? ?Applying the monitor ?  ?Shave hair from upper left chest.  ?Hold abrader disc by orange tab. Rub abrader in 40 strokes over the upper left chest as  ?indicated in your monitor instructions.  ?Clean area with 4 enclosed alcohol pads. Let dry.  ?Apply patch as indicated in monitor instructions. Patch will be placed under collarbone on left  ?side of chest with arrow  pointing upward.  ?Rub patch adhesive wings for 2 minutes. Remove white label marked "1". Remove the white  ?label marked "2". Rub patch adhesive wings for 2 additional minutes.  ?While looking in a mirror, press and release button in center of patch. A small green light will  ?flash 3-4 times. This will be your only indicator that the monitor has been turned on.  ?Do not shower for the first 24 hours. You may shower after the first 24 hours.  ?Press the button if you feel a symptom. You will hear a small click. Record Date, Time and  ?Symptom in the Patient Logbook.  ?When you are ready to remove the patch, follow instructions on the last 2 pages of Patient  ?Logbook. Stick patch monitor onto the last page of Patient Logbook.  ?Place Patient Logbook in the blue and white box. Use locking tab on box and tape box closed  ?securely. The blue and white box has prepaid postage on it. Please place it in the mailbox as  ?soon as possible. Your physician should have your test results approximately 7 days after the  ?monitor has been mailed back to [facility].  ?Call [facility] at [phone] if you have questions regarding  ?your ZIO XT patch monitor. Call them immediately if you see an orange light blinking on your  ?monitor.  ?If your monitor falls off in less than 4 days, contact our Monitor department at [phone].  ?If your monitor becomes loose or falls off after 4 days call [employer] at [phone] for  ?suggestions on securing your monitor ? ? ?

## 2021-04-22 DIAGNOSIS — I480 Paroxysmal atrial fibrillation: Secondary | ICD-10-CM

## 2021-04-24 DIAGNOSIS — M1990 Unspecified osteoarthritis, unspecified site: Secondary | ICD-10-CM | POA: Diagnosis not present

## 2021-04-28 ENCOUNTER — Encounter (HOSPITAL_BASED_OUTPATIENT_CLINIC_OR_DEPARTMENT_OTHER): Payer: Self-pay

## 2021-04-28 ENCOUNTER — Ambulatory Visit (HOSPITAL_BASED_OUTPATIENT_CLINIC_OR_DEPARTMENT_OTHER): Admit: 2021-04-28 | Payer: Medicare Other | Admitting: Orthopaedic Surgery

## 2021-04-28 SURGERY — ARTHROPLASTY, SHOULDER, TOTAL
Anesthesia: Choice | Site: Shoulder | Laterality: Right

## 2021-05-09 DIAGNOSIS — Z20822 Contact with and (suspected) exposure to covid-19: Secondary | ICD-10-CM | POA: Diagnosis not present

## 2021-05-10 DIAGNOSIS — Z96641 Presence of right artificial hip joint: Secondary | ICD-10-CM | POA: Diagnosis not present

## 2021-05-10 DIAGNOSIS — M1612 Unilateral primary osteoarthritis, left hip: Secondary | ICD-10-CM | POA: Diagnosis not present

## 2021-05-10 DIAGNOSIS — G459 Transient cerebral ischemic attack, unspecified: Secondary | ICD-10-CM | POA: Diagnosis not present

## 2021-05-10 DIAGNOSIS — I2510 Atherosclerotic heart disease of native coronary artery without angina pectoris: Secondary | ICD-10-CM | POA: Diagnosis not present

## 2021-05-10 DIAGNOSIS — I214 Non-ST elevation (NSTEMI) myocardial infarction: Secondary | ICD-10-CM | POA: Diagnosis not present

## 2021-05-11 DIAGNOSIS — Z823 Family history of stroke: Secondary | ICD-10-CM | POA: Diagnosis not present

## 2021-05-11 DIAGNOSIS — Z7901 Long term (current) use of anticoagulants: Secondary | ICD-10-CM | POA: Diagnosis not present

## 2021-05-11 DIAGNOSIS — I10 Essential (primary) hypertension: Secondary | ICD-10-CM | POA: Diagnosis not present

## 2021-05-11 DIAGNOSIS — Z8249 Family history of ischemic heart disease and other diseases of the circulatory system: Secondary | ICD-10-CM | POA: Diagnosis not present

## 2021-05-11 DIAGNOSIS — Z79899 Other long term (current) drug therapy: Secondary | ICD-10-CM | POA: Diagnosis not present

## 2021-05-11 DIAGNOSIS — Z87891 Personal history of nicotine dependence: Secondary | ICD-10-CM | POA: Diagnosis not present

## 2021-05-11 DIAGNOSIS — I2510 Atherosclerotic heart disease of native coronary artery without angina pectoris: Secondary | ICD-10-CM | POA: Diagnosis not present

## 2021-05-11 DIAGNOSIS — Z96643 Presence of artificial hip joint, bilateral: Secondary | ICD-10-CM | POA: Diagnosis not present

## 2021-05-11 DIAGNOSIS — M1612 Unilateral primary osteoarthritis, left hip: Secondary | ICD-10-CM | POA: Diagnosis not present

## 2021-05-11 DIAGNOSIS — Z955 Presence of coronary angioplasty implant and graft: Secondary | ICD-10-CM | POA: Diagnosis not present

## 2021-05-11 DIAGNOSIS — Z96641 Presence of right artificial hip joint: Secondary | ICD-10-CM | POA: Diagnosis not present

## 2021-05-11 DIAGNOSIS — I480 Paroxysmal atrial fibrillation: Secondary | ICD-10-CM | POA: Diagnosis not present

## 2021-05-12 DIAGNOSIS — I2510 Atherosclerotic heart disease of native coronary artery without angina pectoris: Secondary | ICD-10-CM | POA: Diagnosis not present

## 2021-05-12 DIAGNOSIS — I480 Paroxysmal atrial fibrillation: Secondary | ICD-10-CM | POA: Diagnosis not present

## 2021-05-12 DIAGNOSIS — Z955 Presence of coronary angioplasty implant and graft: Secondary | ICD-10-CM | POA: Diagnosis not present

## 2021-05-12 DIAGNOSIS — M1612 Unilateral primary osteoarthritis, left hip: Secondary | ICD-10-CM | POA: Diagnosis not present

## 2021-05-12 DIAGNOSIS — I10 Essential (primary) hypertension: Secondary | ICD-10-CM | POA: Diagnosis not present

## 2021-05-12 DIAGNOSIS — Z87891 Personal history of nicotine dependence: Secondary | ICD-10-CM | POA: Diagnosis not present

## 2021-05-16 DIAGNOSIS — I480 Paroxysmal atrial fibrillation: Secondary | ICD-10-CM | POA: Diagnosis not present

## 2021-05-24 DIAGNOSIS — M1990 Unspecified osteoarthritis, unspecified site: Secondary | ICD-10-CM | POA: Diagnosis not present

## 2021-05-31 DIAGNOSIS — Z96642 Presence of left artificial hip joint: Secondary | ICD-10-CM | POA: Diagnosis not present

## 2021-05-31 DIAGNOSIS — M1612 Unilateral primary osteoarthritis, left hip: Secondary | ICD-10-CM | POA: Diagnosis not present

## 2021-06-01 ENCOUNTER — Ambulatory Visit (INDEPENDENT_AMBULATORY_CARE_PROVIDER_SITE_OTHER): Payer: Medicare Other | Admitting: Dermatology

## 2021-06-01 DIAGNOSIS — I2510 Atherosclerotic heart disease of native coronary artery without angina pectoris: Secondary | ICD-10-CM

## 2021-06-01 DIAGNOSIS — L578 Other skin changes due to chronic exposure to nonionizing radiation: Secondary | ICD-10-CM | POA: Diagnosis not present

## 2021-06-01 DIAGNOSIS — Z1283 Encounter for screening for malignant neoplasm of skin: Secondary | ICD-10-CM

## 2021-06-01 DIAGNOSIS — L570 Actinic keratosis: Secondary | ICD-10-CM | POA: Diagnosis not present

## 2021-06-01 DIAGNOSIS — L814 Other melanin hyperpigmentation: Secondary | ICD-10-CM | POA: Diagnosis not present

## 2021-06-01 DIAGNOSIS — L821 Other seborrheic keratosis: Secondary | ICD-10-CM

## 2021-06-01 DIAGNOSIS — D1800 Hemangioma unspecified site: Secondary | ICD-10-CM | POA: Diagnosis not present

## 2021-06-01 DIAGNOSIS — Z85828 Personal history of other malignant neoplasm of skin: Secondary | ICD-10-CM | POA: Diagnosis not present

## 2021-06-01 DIAGNOSIS — D229 Melanocytic nevi, unspecified: Secondary | ICD-10-CM | POA: Diagnosis not present

## 2021-06-01 DIAGNOSIS — L820 Inflamed seborrheic keratosis: Secondary | ICD-10-CM

## 2021-06-01 MED ORDER — FLUOROURACIL 5 % EX CREA: TOPICAL_CREAM | Freq: Two times a day (BID) | CUTANEOUS | 0 refills | Status: DC

## 2021-06-01 NOTE — Progress Notes (Signed)
Follow-Up Visit   Subjective  Paul Acosta is a 71 y.o. male who presents for the following: Annual Exam (History of [place] - The patient presents for Total-Body Skin Exam (TBSE) for skin cancer screening and mole check.  The patient has spots, moles and lesions to be evaluated, some may be new or changing and the patient has concerns that these could be cancer./).  The following portions of the chart were reviewed this encounter and updated as appropriate:   Tobacco  Allergies  Meds  Problems  Med Hx  Surg Hx  Fam Hx     Review of Systems:  No other skin or systemic complaints except as noted in HPI or Assessment and Plan.  Objective  Well appearing patient in no apparent distress; mood and affect are within normal limits.  A full examination was performed including scalp, head, eyes, ears, nose, lips, neck, chest, axillae, abdomen, back, buttocks, bilateral upper extremities, bilateral lower extremities, hands, feet, fingers, toes, fingernails, and toenails. All findings within normal limits unless otherwise noted below.  Right lat crown scalp Erythematous stuck-on, waxy papule or plaque  Left mid vertex, scalp Hyperkeratotic papule of left mid vertex. Erythematous thin papules/macules with gritty scale.    Assessment & Plan   Lentigines - Scattered tan macules - Due to sun exposure - Benign-appearing, observe - Recommend daily broad spectrum sunscreen SPF 30+ to sun-exposed areas, reapply every 2 hours as needed. - Call for any changes  Seborrheic Keratoses - Stuck-on, waxy, tan-brown papules and/or plaques  - Benign-appearing - Discussed benign etiology and prognosis. - Observe - Call for any changes  Melanocytic Nevi - Tan-brown and/or pink-flesh-colored symmetric macules and papules - Benign appearing on exam today - Observation - Call clinic for new or changing moles - Recommend daily use of broad spectrum spf 30+ sunscreen to sun-exposed areas.    Hemangiomas - Red papules - Discussed benign nature - Observe - Call for any changes  Skin cancer screening performed today.  Inflamed seborrheic keratosis Right lat crown scalp  Destruction of lesion - Right lat crown scalp Complexity: simple   Destruction method: cryotherapy   Informed consent: discussed and consent obtained   Timeout:  patient name, date of birth, surgical site, and procedure verified Lesion destroyed using liquid nitrogen: Yes   Region frozen until ice ball extended beyond lesion: Yes   Outcome: patient tolerated procedure well with no complications   Post-procedure details: wound care instructions given    AK (actinic keratosis) Left mid vertex, scalp  Actinic Damage - Severe, confluent actinic changes with pre-cancerous actinic keratoses  - Severe, chronic, not at goal, secondary to cumulative UV radiation exposure over time - diffuse scaly erythematous macules and papules with underlying dyspigmentation - Discussed Prescription "Field Treatment" for Severe, Chronic Confluent Actinic Changes with Pre-Cancerous Actinic Keratoses Field treatment involves treatment of an entire area of skin that has confluent Actinic Changes (Sun/ Ultraviolet light damage) and PreCancerous Actinic Keratoses by method of PhotoDynamic Therapy (PDT) and/or prescription Topical Chemotherapy agents such as 5-fluorouracil, 5-fluorouracil/calcipotriene, and/or imiquimod.  The purpose is to decrease the number of clinically evident and subclinical PreCancerous lesions to prevent progression to development of skin cancer by chemically destroying early precancer changes that may or may not be visible.  It has been shown to reduce the risk of developing skin cancer in the treated area. As a result of treatment, redness, scaling, crusting, and open sores may occur during treatment course. One or more than one  of these methods may be used and may have to be used several times to control,  suppress and eliminate the PreCancerous changes. Discussed treatment course, expected reaction, and possible side effects. - Recommend daily broad spectrum sunscreen SPF 30+ to sun-exposed areas, reapply every 2 hours as needed.  - Staying in the shade or wearing long sleeves, sun glasses (UVA+UVB protection) and wide brim hats (4-inch brim around the entire circumference of the hat) are also recommended. - Call for new or changing lesions.  In one month, start Fluorouracil 5%/Calcipotriene cream bid x 1 week  Destruction of lesion - Left mid vertex, scalp Complexity: simple   Destruction method: cryotherapy   Informed consent: discussed and consent obtained   Timeout:  patient name, date of birth, surgical site, and procedure verified Lesion destroyed using liquid nitrogen: Yes   Region frozen until ice ball extended beyond lesion: Yes   Outcome: patient tolerated procedure well with no complications   Post-procedure details: wound care instructions given    fluorouracil (EFUDEX) 5 % cream - Left mid vertex, scalp Apply topically 2 (two) times daily. X 1 week to scalp  Skin cancer screening   Return in about 6 months (around 12/02/2021) for AK follow up.  I, Ashok Cordia, CMA, am acting as scribe for [name], MD . Documentation: I have reviewed the above documentation for accuracy and completeness, and I agree with the above.  [name], MD

## 2021-06-01 NOTE — Patient Instructions (Signed)

## 2021-06-03 ENCOUNTER — Other Ambulatory Visit: Payer: Self-pay | Admitting: Cardiovascular Disease

## 2021-06-11 ENCOUNTER — Encounter: Payer: Self-pay | Admitting: Dermatology

## 2021-06-14 ENCOUNTER — Ambulatory Visit (INDEPENDENT_AMBULATORY_CARE_PROVIDER_SITE_OTHER): Payer: Medicare Other | Admitting: Cardiology

## 2021-06-14 ENCOUNTER — Encounter: Payer: Self-pay | Admitting: *Deleted

## 2021-06-14 ENCOUNTER — Encounter: Payer: Self-pay | Admitting: Cardiology

## 2021-06-14 VITALS — BP 126/74 | HR 101 | Ht 78.0 in | Wt 235.8 lb

## 2021-06-14 DIAGNOSIS — Z01812 Encounter for preprocedural laboratory examination: Secondary | ICD-10-CM

## 2021-06-14 DIAGNOSIS — D6869 Other thrombophilia: Secondary | ICD-10-CM

## 2021-06-14 DIAGNOSIS — I4819 Other persistent atrial fibrillation: Secondary | ICD-10-CM

## 2021-06-14 MED ORDER — METOPROLOL TARTRATE 100 MG PO TABS: ORAL_TABLET | ORAL | 0 refills | Status: DC

## 2021-06-14 NOTE — Progress Notes (Signed)
Electrophysiology Office Note   Date:  06/14/2021   ID:  Paul Acosta, Paul Acosta 09-Nov-1950, MRN [id number]  PCP:  Maury Dus, MD  Cardiologist:  Burt Knack Primary Electrophysiologist:  Samwise Eckardt Paul Leeds, MD    Chief Complaint: AF   History of Present Illness: Paul Acosta is a 71 y.o. male who is being seen today for the evaluation of AF at the request of Sherren Mocha, MD. Presenting today for electrophysiology evaluation.  He has a history significant for coronary artery disease status post LAD stent, dilated aortic root, hypertension, hyperlipidemia, persistent atrial fibrillation.    Today, he denies symptoms of palpitations, chest pain, shortness of breath, orthopnea, PND, lower extremity edema, claudication, dizziness, presyncope, syncope, bleeding, or neurologic sequela. The patient is tolerating medications without difficulties.  He states that he went into atrial fibrillation in December.  He has been paroxysmal for a while, but is unfortunately been persistent.  He does not have chest pain or shortness of breath, but does note an increased fatigue towards the end of the day.  He is also had his hip replaced approximately 5 weeks ago and feels that some of his fatigue is related to that.  Aside from that, he would like to get back into normal rhythm.   Past Medical History:  Diagnosis Date   Basal cell carcinoma 01/28/2015   R post auricular neck - ED&C    Dysplastic nevus 07/27/2009   R med pectoral - mild    Hypertension    Medical history non-contributory    Past Surgical History:  Procedure Laterality Date   EYE SURGERY     LEFT HEART CATH AND CORONARY ANGIOGRAPHY N/A 04/21/2019   Procedure: LEFT HEART CATH AND CORONARY ANGIOGRAPHY;  Surgeon: Troy Sine, MD;  Location: [facility];  Service: Cardiovascular;  Laterality: N/A;   TOTAL HIP ARTHROPLASTY Right 11/07/2013   Procedure: RIGHT TOTAL HIP ARTHROPLASTY;  Surgeon: Kerin Salen, MD;  Location: [facility];   Service: Orthopedics;  Laterality: Right;     Current Outpatient Medications  Medication Sig Dispense Refill   acetaminophen (TYLENOL) 650 MG CR tablet Take 650 mg by mouth in the morning and at bedtime.     amLODipine (NORVASC) 5 MG tablet TAKE ONE TABLET BY MOUTH DAILY 90 tablet 2   apixaban (ELIQUIS) 5 MG TABS tablet Take 1 tablet (5 mg total) by mouth 2 (two) times daily. 180 tablet 3   ascorbic acid (VITAMIN C) 1000 MG tablet Take 1,000 mg by mouth daily.     Cholecalciferol (VITAMIN D3 GUMMIES) 25 MCG (1000 UT) CHEW Chew by mouth. Per patient taking 5000 units daily     Cyanocobalamin (B-12 PO) Take 1 tablet by mouth daily.     fluorouracil (EFUDEX) 5 % cream Apply topically 2 (two) times daily. X 1 week to scalp 15 g 0   fluticasone (FLONASE) 50 MCG/ACT nasal spray Place 1 spray into both nostrils daily as needed for allergies or rhinitis.     ketoconazole (NIZORAL) 2 % cream Apply to affected areas at scalp three times weekly at bedtime. 30 g 1   losartan (COZAAR) 50 MG tablet Take 50 mg by mouth daily.     meloxicam (MOBIC) 15 MG tablet Take 15 mg by mouth daily.     metoprolol tartrate (LOPRESSOR) 100 MG tablet Take 1 tablet (100 mg total) two hours prior to CT testing 1 tablet 0   Multiple Vitamins-Minerals (ZINC PO) Take 1 tablet by mouth  daily.     nitroGLYCERIN (NITROSTAT) 0.4 MG SL tablet Place 1 tablet (0.4 mg total) under the tongue every 5 (five) minutes as needed for chest pain. 30 tablet 0   rosuvastatin (CRESTOR) 40 MG tablet TAKE ONE TABLET BY MOUTH DAILY AT 6 IN THE EVENING 90 tablet 3   Turmeric 500 MG CAPS Take 1 capsule by mouth daily.     vitamin B-12 (CYANOCOBALAMIN) 250 MCG tablet Take 250 mcg by mouth daily.     Zinc 100 MG TABS Take 1 tablet by mouth daily.     No current facility-administered medications for this visit.    Allergies:   Patient has no known allergies.   Social History:  The patient  reports that he has never smoked. He has never used  smokeless tobacco. He reports current alcohol use of about 4.0 - 5.0 standard drinks per week. He reports that he does not use drugs.   Family History:  The patient's family history includes Atrial fibrillation in his brother; Heart attack (age of onset: 63) in his father; Stroke (age of onset: 19) in his mother.    ROS:  Please see the history of present illness.   Otherwise, review of systems is positive for none.   All other systems are reviewed and negative.    PHYSICAL EXAM: VS:  BP 126/74   Pulse (!) 101   Ht '6\' 6"'$  (1.981 m)   Wt 235 lb 12.8 oz (107 kg)   SpO2 98%   BMI 27.25 kg/m  , BMI Body mass index is 27.25 kg/m. GEN: Well nourished, well developed, in no acute distress  HEENT: normal  Neck: no JVD, carotid bruits, or masses Cardiac: Irregular, tachycardic; no murmurs, rubs, or gallops,no edema  Respiratory:  clear to auscultation bilaterally, normal work of breathing GI: soft, nontender, nondistended, + BS MS: no deformity or atrophy  Skin: warm and dry Neuro:  Strength and sensation are intact Psych: euthymic mood, full affect  EKG:  EKG is ordered today. Personal review of the ekg ordered shows atrial fibrillation, right bundle branch block, left posterior fascicular block  Recent Labs: 01/31/2021: BUN 23; Creatinine, Ser 1.04; Hemoglobin 15.0; Platelets 205; Potassium 4.7; Sodium 137    Lipid Panel     Component Value Date/Time   CHOL 112 04/14/2020 0733   TRIG 63 04/14/2020 0733   HDL 50 04/14/2020 0733   CHOLHDL 2.2 04/14/2020 0733   CHOLHDL 3.6 04/19/2019 1819   VLDL 33 04/19/2019 1819   LDLCALC 48 04/14/2020 0733     Wt Readings from Last 3 Encounters:  06/14/21 235 lb 12.8 oz (107 kg)  04/18/21 235 lb (106.6 kg)  01/12/21 255 lb 3.2 oz (115.8 kg)      Other studies Reviewed: Additional studies/ records that were reviewed today include: TTE 04/14/20  Review of the above records today demonstrates:   1. Left ventricular ejection fraction, by  estimation, is 55 to 60%. Left  ventricular ejection fraction by PLAX is 59 %. The left ventricle has  normal function. The left ventricle has no regional wall motion  abnormalities. There is mild concentric left  ventricular hypertrophy. Left ventricular diastolic parameters were  normal.   2. Right ventricular systolic function is normal. The right ventricular  size is normal.   3. Left atrial size was mildly dilated.   4. The mitral valve is normal in structure. Trivial mitral valve  regurgitation. No evidence of mitral stenosis.   5. The aortic valve is  tricuspid. Aortic valve regurgitation is not  visualized. No aortic stenosis is present.   6. Aortic dilatation noted. There is mild dilatation of the aortic root,  measuring 42 mm. There is mild dilatation of the ascending aorta,  measuring 41 mm.   7. The inferior vena cava is normal in size with greater than 50%  respiratory variability, suggesting right atrial pressure of 3 mmHg.   Cardiac monitor 05/19/2021 personally reviewed persistent atrial fibrillation throughout recording. Rare PVC's and ventricular runs up to 5 beats.   ASSESSMENT AND PLAN:  1.  Persistent atrial fibrillation: Currently on Eliquis 5 mg twice daily.  CHA2DS2-VASc of 3.  He would like get back into normal rhythm.  He would prefer to avoid medications as he states that he is on multiple medications now.  Due to that, we Aiken Withem plan for ablation.  Risk, benefits, and alternatives to EP study and radiofrequency ablation for afib were also discussed in detail today. These risks include but are not limited to stroke, bleeding, vascular damage, tamponade, perforation, damage to the esophagus, lungs, and other structures, pulmonary vein stenosis, worsening renal function, and death. The patient understands these risk and wishes to proceed.  We Lacara Dunsworth therefore proceed with catheter ablation at the next available time.  Carto, ICE, anesthesia are requested for the procedure.   Zeniah Briney also obtain CT PV protocol prior to the procedure to exclude LAA thrombus and further evaluate atrial anatomy.   2.  Coronary artery disease: Status post LAD stent.  No current chest pain.  Plan per primary cardiology.  3.  Hypertension: Currently well controlled  4.  Secondary hypercoagulable state: Currently on Eliquis for atrial fibrillation as above.  Follow-up  Case discussed with primary cardiology  Current medicines are reviewed at length with the patient today.   The patient does not have concerns regarding his medicines.  The following changes were made today:  none  Labs/ tests ordered today include:  Orders Placed This Encounter  Procedures   EKG 12-Lead     Disposition:   FU with Otha Monical 3 months  Signed, Joane Postel Paul Leeds, MD  06/14/2021 9:24 AM     [place] [address] [place] [address] [phone] (office) [phone] (fax)

## 2021-06-14 NOTE — Patient Instructions (Signed)
Medication Instructions:  Your physician recommends that you continue on your current medications as directed. Please refer to the Current Medication list given to you today.  *If you need a refill on your cardiac medications before your next appointment, please call your pharmacy*   Lab Work: Pre procedure labs - see procedure instruction letter:  BMP & CBC  If you have labs (blood work) drawn today and your tests are completely normal, you will receive your results only by: MyChart Message (if you have MyChart) OR A paper copy in the mail If you have any lab test that is abnormal or we need to change your treatment, we will call you to review the results.   Testing/Procedures: Your physician has requested that you have cardiac CT within 7 days PRIOR to your ablation. Cardiac computed tomography (CT) is a painless test that uses an x-ray machine to take clear, detailed pictures of your heart.  Please follow instruction below located under "other instructions". You will get a call from our office to schedule the date for this test.  Your physician has recommended that you have an ablation. Catheter ablation is a medical procedure used to treat some cardiac arrhythmias (irregular heartbeats). During catheter ablation, a long, thin, flexible tube is put into a blood vessel in your groin (upper thigh), or neck. This tube is called an ablation catheter. It is then guided to your heart through the blood vessel. Radio frequency waves destroy small areas of heart tissue where abnormal heartbeats may cause an arrhythmia to start. Please follow instruction letter given to you today.   Follow-Up: At [facility], you and your health needs are our priority.  As part of our continuing mission to provide you with exceptional heart care, we have created designated Provider Care Teams.  These Care Teams include your primary Cardiologist (physician) and Advanced Practice Providers (APPs -  Physician  Assistants and Nurse Practitioners) who all work together to provide you with the care you need, when you need it.  Your next appointment:   1 month(s) after your ablation  The format for your next appointment:   In Person  Provider:   AFib clinic   Thank you for choosing [facility]!!   Sheronica Corey, RN [phone]    Other Instructions  Cardiac Ablation Cardiac ablation is a procedure to destroy (ablate) some heart tissue that is sending bad signals. These bad signals cause problems in heart rhythm. The heart has many areas that make these signals. If there are problems in these areas, they can make the heart beat in a way that is not normal. Destroying some tissues can help make the heart rhythm normal. Tell your doctor about: Any allergies you have. All medicines you are taking. These include vitamins, herbs, eye drops, creams, and over-the-counter medicines. Any problems you or family members have had with medicines that make you fall asleep (anesthetics). Any blood disorders you have. Any surgeries you have had. Any medical conditions you have, such as kidney failure. Whether you are pregnant or may be pregnant. What are the risks? This is a safe procedure. But problems may occur, including: Infection. Bruising and bleeding. Bleeding into the chest. Stroke or blood clots. Damage to nearby areas of your body. Allergies to medicines or dyes. The need for a pacemaker if the normal system is damaged. Failure of the procedure to treat the problem. What happens before the procedure? Medicines Ask your doctor about: Changing or stopping your normal medicines. This is   important. Taking aspirin and ibuprofen. Do not take these medicines unless your doctor tells you to take them. Taking other medicines, vitamins, herbs, and supplements. General instructions Follow instructions from your doctor about what you cannot eat or drink. Plan to have someone take you home  from the hospital or clinic. If you will be going home right after the procedure, plan to have someone with you for 24 hours. Ask your doctor what steps will be taken to prevent infection. What happens during the procedure?  An IV tube will be put into one of your veins. You will be given a medicine to help you relax. The skin on your neck or groin will be numbed. A cut (incision) will be made in your neck or groin. A needle will be put through your cut and into a large vein. A tube (catheter) will be put into the needle. The tube will be moved to your heart. Dye may be put through the tube. This helps your doctor see your heart. Small devices (electrodes) on the tube will send out signals. A type of energy will be used to destroy some heart tissue. The tube will be taken out. Pressure will be held on your cut. This helps stop bleeding. A bandage will be put over your cut. The exact procedure may vary among doctors and hospitals. What happens after the procedure? You will be watched until you leave the hospital or clinic. This includes checking your heart rate, breathing rate, oxygen, and blood pressure. Your cut will be watched for bleeding. You will need to lie still for a few hours. Do not drive for 24 hours or as long as your doctor tells you. Summary Cardiac ablation is a procedure to destroy some heart tissue. This is done to treat heart rhythm problems. Tell your doctor about any medical conditions you may have. Tell him or her about all medicines you are taking to treat them. This is a safe procedure. But problems may occur. These include infection, bruising, bleeding, and damage to nearby areas of your body. Follow what your doctor tells you about food and drink. You may also be told to change or stop some of your medicines. After the procedure, do not drive for 24 hours or as long as your doctor tells you. This information is not intended to replace advice given to you by your  health care provider. Make sure you discuss any questions you have with your health care provider. Document Revised: 12/05/2018 Document Reviewed: 12/05/2018 Elsevier Patient Education  2023 Elsevier Inc.  

## 2021-06-15 DIAGNOSIS — M25552 Pain in left hip: Secondary | ICD-10-CM | POA: Diagnosis not present

## 2021-06-15 DIAGNOSIS — M545 Low back pain, unspecified: Secondary | ICD-10-CM | POA: Diagnosis not present

## 2021-06-17 DIAGNOSIS — M545 Low back pain, unspecified: Secondary | ICD-10-CM | POA: Diagnosis not present

## 2021-06-17 DIAGNOSIS — M25552 Pain in left hip: Secondary | ICD-10-CM | POA: Diagnosis not present

## 2021-06-21 DIAGNOSIS — M25552 Pain in left hip: Secondary | ICD-10-CM | POA: Diagnosis not present

## 2021-06-21 DIAGNOSIS — M545 Low back pain, unspecified: Secondary | ICD-10-CM | POA: Diagnosis not present

## 2021-06-23 DIAGNOSIS — M1990 Unspecified osteoarthritis, unspecified site: Secondary | ICD-10-CM | POA: Diagnosis not present

## 2021-06-24 DIAGNOSIS — M25552 Pain in left hip: Secondary | ICD-10-CM | POA: Diagnosis not present

## 2021-06-24 DIAGNOSIS — M545 Low back pain, unspecified: Secondary | ICD-10-CM | POA: Diagnosis not present

## 2021-06-24 DIAGNOSIS — Z471 Aftercare following joint replacement surgery: Secondary | ICD-10-CM | POA: Diagnosis not present

## 2021-06-24 DIAGNOSIS — Z96642 Presence of left artificial hip joint: Secondary | ICD-10-CM | POA: Diagnosis not present

## 2021-06-29 DIAGNOSIS — M545 Low back pain, unspecified: Secondary | ICD-10-CM | POA: Diagnosis not present

## 2021-06-29 DIAGNOSIS — M25552 Pain in left hip: Secondary | ICD-10-CM | POA: Diagnosis not present

## 2021-07-01 DIAGNOSIS — M4186 Other forms of scoliosis, lumbar region: Secondary | ICD-10-CM | POA: Diagnosis not present

## 2021-07-01 DIAGNOSIS — M4696 Unspecified inflammatory spondylopathy, lumbar region: Secondary | ICD-10-CM | POA: Diagnosis not present

## 2021-07-04 DIAGNOSIS — M47816 Spondylosis without myelopathy or radiculopathy, lumbar region: Secondary | ICD-10-CM | POA: Diagnosis not present

## 2021-07-07 DIAGNOSIS — M47816 Spondylosis without myelopathy or radiculopathy, lumbar region: Secondary | ICD-10-CM | POA: Diagnosis not present

## 2021-07-23 DIAGNOSIS — M1990 Unspecified osteoarthritis, unspecified site: Secondary | ICD-10-CM | POA: Diagnosis not present

## 2021-07-28 DIAGNOSIS — M25552 Pain in left hip: Secondary | ICD-10-CM | POA: Diagnosis not present

## 2021-07-28 DIAGNOSIS — M545 Low back pain, unspecified: Secondary | ICD-10-CM | POA: Diagnosis not present

## 2021-07-29 DIAGNOSIS — M5416 Radiculopathy, lumbar region: Secondary | ICD-10-CM | POA: Diagnosis not present

## 2021-07-30 DIAGNOSIS — M545 Low back pain, unspecified: Secondary | ICD-10-CM | POA: Diagnosis not present

## 2021-08-04 DIAGNOSIS — M545 Low back pain, unspecified: Secondary | ICD-10-CM | POA: Diagnosis not present

## 2021-08-04 DIAGNOSIS — M25552 Pain in left hip: Secondary | ICD-10-CM | POA: Diagnosis not present

## 2021-08-08 ENCOUNTER — Telehealth: Payer: Self-pay | Admitting: *Deleted

## 2021-08-08 DIAGNOSIS — M47816 Spondylosis without myelopathy or radiculopathy, lumbar region: Secondary | ICD-10-CM | POA: Diagnosis not present

## 2021-08-08 NOTE — Telephone Encounter (Signed)
   Pre-operative Risk Assessment    Patient Name: Paul Acosta  DOB: 05-26-1950 MRN: [id number]      Request for Surgical Clearance    Procedure:   B/L L3/L4/L5 LUMBAR RADIOFREQUENCY ABLATION  Date of Surgery:  Clearance TBD                                 Surgeon:  DR. Normajean Glasgow Surgeon's Group or Practice Name:  [facility] Phone number:  [phone] Fax number:  [phone] ATTN: Lattie Corns   Type of Clearance Requested:   - Medical  - Pharmacy:  Hold Apixaban (Eliquis) x 2-3 DAYS PRIOR   Type of Anesthesia:  Not Indicated   Additional requests/questions:    [name]   08/08/2021, 3:20 PM

## 2021-08-09 NOTE — Telephone Encounter (Signed)
Patient with diagnosis of A Fib on Eliquis for anticoagulation.  Ablation scheduled for 09/29/21.  Procedure: B/L L3/L4/L5 LUMBAR RADIOFREQUENCY ABLATION Date of procedure: TBD   CHA2DS2-VASc Score = 3  This indicates a 3.2% annual risk of stroke. The patient's score is based upon: CHF History: 0 HTN History: 1 Diabetes History: 0 Stroke History: 0 Vascular Disease History: 1 Age Score: 1 Gender Score: 0   CrCl 150 mLmin Platelet count 153K  Per office protocol, patient can hold Eliquis for 3 days prior to procedure.    Note, patient must be on 4 weeks of uninterrupted anticoagulation before his ablation on 9/14.  **This guidance is not considered finalized until pre-operative APP has relayed final recommendations.**

## 2021-08-09 NOTE — Telephone Encounter (Signed)
   Name: Paul Acosta  DOB: Oct 08, 1950  MRN: [id number]  Primary Cardiologist: Sherren Mocha, MD   Preoperative team, please contact this patient and set up a phone call appointment for further preoperative risk assessment. Please obtain consent and complete medication review. Thank you for your help.  I confirm that guidance regarding antiplatelet and oral anticoagulation therapy has been completed and, if necessary, noted below.  Patient with diagnosis of A Fib on Eliquis for anticoagulation.  Ablation scheduled for 09/29/21.   Procedure: B/L L3/L4/L5 LUMBAR RADIOFREQUENCY ABLATION Date of procedure: TBD     CHA2DS2-VASc Score = 3  This indicates a 3.2% annual risk of stroke. The patient's score is based upon: CHF History: 0 HTN History: 1 Diabetes History: 0 Stroke History: 0 Vascular Disease History: 1 Age Score: 1 Gender Score: 0   CrCl 150 mLmin Platelet count 153K   Per office protocol, patient can hold Eliquis for 3 days prior to procedure.     Note, patient must be on 4 weeks of uninterrupted anticoagulation before his ablation on 9/14.    Lenna Sciara, NP 08/09/2021, 4:52 PM [place] [address] [place], [place] [address]

## 2021-08-11 ENCOUNTER — Telehealth: Payer: Self-pay | Admitting: *Deleted

## 2021-08-11 DIAGNOSIS — M25552 Pain in left hip: Secondary | ICD-10-CM | POA: Diagnosis not present

## 2021-08-11 DIAGNOSIS — M5136 Other intervertebral disc degeneration, lumbar region: Secondary | ICD-10-CM | POA: Diagnosis not present

## 2021-08-11 DIAGNOSIS — M545 Low back pain, unspecified: Secondary | ICD-10-CM | POA: Diagnosis not present

## 2021-08-11 NOTE — Telephone Encounter (Signed)
Pt tele appt pre op 08/16/21 @ 2:20. Med rec and consent are done.

## 2021-08-11 NOTE — Telephone Encounter (Signed)
Pt tele appt pre op 08/16/21 @ 2:20. Med rec and consent are done.     Patient Consent for Virtual Visit        Paul Acosta has provided verbal consent on 08/11/2021 for a virtual visit (video or telephone).   CONSENT FOR VIRTUAL VISIT FOR:  Paul Acosta  By participating in this virtual visit I agree to the following:  I hereby voluntarily request, consent and authorize [facility] and its employed or contracted physicians, physician assistants, nurse practitioners or other licensed health care professionals (the Practitioner), to provide me with telemedicine health care services (the "Services") as deemed necessary by the treating Practitioner. I acknowledge and consent to receive the Services by the Practitioner via telemedicine. I understand that the telemedicine visit will involve communicating with the Practitioner through live audiovisual communication technology and the disclosure of certain medical information by electronic transmission. I acknowledge that I have been given the opportunity to request an in-person assessment or other available alternative prior to the telemedicine visit and am voluntarily participating in the telemedicine visit.  I understand that I have the right to withhold or withdraw my consent to the use of telemedicine in the course of my care at any time, without affecting my right to future care or treatment, and that the Practitioner or I may terminate the telemedicine visit at any time. I understand that I have the right to inspect all information obtained and/or recorded in the course of the telemedicine visit and may receive copies of available information for a reasonable fee.  I understand that some of the potential risks of receiving the Services via telemedicine include:  Delay or interruption in medical evaluation due to technological equipment failure or disruption; Information transmitted may not be sufficient (e.g. poor resolution of images) to allow  for appropriate medical decision making by the Practitioner; and/or  In rare instances, security protocols could fail, causing a breach of personal health information.  Furthermore, I acknowledge that it is my responsibility to provide information about my medical history, conditions and care that is complete and accurate to the best of my ability. I acknowledge that Practitioner's advice, recommendations, and/or decision may be based on factors not within their control, such as incomplete or inaccurate data provided by me or distortions of diagnostic images or specimens that may result from electronic transmissions. I understand that the practice of medicine is not an exact science and that Practitioner makes no warranties or guarantees regarding treatment outcomes. I acknowledge that a copy of this consent can be made available to me via my patient portal ([facility]), or I can request a printed copy by calling the office of [facility].    I understand that my insurance will be billed for this visit.   I have read or had this consent read to me. I understand the contents of this consent, which adequately explains the benefits and risks of the Services being provided via telemedicine.  I have been provided ample opportunity to ask questions regarding this consent and the Services and have had my questions answered to my satisfaction. I give my informed consent for the services to be provided through the use of telemedicine in my medical care

## 2021-08-16 ENCOUNTER — Ambulatory Visit (INDEPENDENT_AMBULATORY_CARE_PROVIDER_SITE_OTHER): Payer: Medicare Other | Admitting: General Practice

## 2021-08-16 ENCOUNTER — Encounter: Payer: Self-pay | Admitting: General Practice

## 2021-08-16 DIAGNOSIS — Z01810 Encounter for preprocedural cardiovascular examination: Secondary | ICD-10-CM

## 2021-08-16 NOTE — Progress Notes (Signed)
Patient reports that he does not wish to proceed with surgery at this time.  He is exploring other options for treatment of his back pain.  We reviewed his upcoming A-fib ablation with Dr. Curt Bears on 09/29/2021.  He reported that he will contact us if he decides to proceed with further spinal procedures.  Jossie Ng. Karessa Onorato NP-C     08/16/2021, 2:18 PM [place] [address] Office [phone] Fax [phone]

## 2021-08-17 DIAGNOSIS — M5416 Radiculopathy, lumbar region: Secondary | ICD-10-CM | POA: Diagnosis not present

## 2021-08-19 NOTE — Telephone Encounter (Signed)
Please see preoperative clearance note from 08/16/2021 regarding preoperative clearance.

## 2021-08-19 NOTE — Telephone Encounter (Signed)
Office calling in to get update on Patient clearance. Please advise

## 2021-08-22 DIAGNOSIS — M1990 Unspecified osteoarthritis, unspecified site: Secondary | ICD-10-CM | POA: Diagnosis not present

## 2021-09-16 ENCOUNTER — Ambulatory Visit: Payer: Medicare Other | Attending: Cardiology

## 2021-09-16 DIAGNOSIS — Z01812 Encounter for preprocedural laboratory examination: Secondary | ICD-10-CM | POA: Insufficient documentation

## 2021-09-16 DIAGNOSIS — I4819 Other persistent atrial fibrillation: Secondary | ICD-10-CM

## 2021-09-16 DIAGNOSIS — M5416 Radiculopathy, lumbar region: Secondary | ICD-10-CM | POA: Diagnosis not present

## 2021-09-17 LAB — BASIC METABOLIC PANEL
BUN/Creatinine Ratio: 22 (ref 10–24)
BUN: 21 mg/dL (ref 8–27)
CO2: 24 mmol/L (ref 20–29)
Calcium: 9.3 mg/dL (ref 8.6–10.2)
Chloride: 104 mmol/L (ref 96–106)
Creatinine, Ser: 0.96 mg/dL (ref 0.76–1.27)
Glucose: 104 mg/dL — ABNORMAL HIGH (ref 70–99)
Potassium: 4.4 mmol/L (ref 3.5–5.2)
Sodium: 140 mmol/L (ref 134–144)
eGFR: 86 mL/min/{1.73_m2} (ref >59)

## 2021-09-17 LAB — CBC
Hematocrit: 38.9 % (ref 37.5–51.0)
Hemoglobin: 13.2 g/dL (ref 13.0–17.7)
MCH: 30.4 pg (ref 26.6–33.0)
MCHC: 33.9 g/dL (ref 31.5–35.7)
MCV: 90 fL (ref 79–97)
Platelets: 201 10*3/uL (ref 150–450)
RBC: 4.34 x10E6/uL (ref 4.14–5.80)
RDW: 13.6 % (ref 11.6–15.4)
WBC: 3.2 10*3/uL — ABNORMAL LOW (ref 3.4–10.8)

## 2021-09-21 ENCOUNTER — Telehealth (HOSPITAL_COMMUNITY): Payer: Self-pay | Admitting: *Deleted

## 2021-09-21 DIAGNOSIS — M1990 Unspecified osteoarthritis, unspecified site: Secondary | ICD-10-CM | POA: Diagnosis not present

## 2021-09-21 NOTE — Telephone Encounter (Signed)
Patient returning call regarding upcoming cardiac imaging study; pt verbalizes understanding of appt date/time, parking situation and where to check in, pre-test NPO status and verified current allergies; name and call back number provided for further questions should they arise  Paul Clement RN Navigator Cardiac [facility] [phone] office [phone] cell  Patient aware to arrive at 7:30am.

## 2021-09-21 NOTE — Telephone Encounter (Signed)
Attempted to call patient regarding upcoming cardiac CT appointment. °Left message on voicemail with name and callback number ° °Thorn Demas RN Navigator Cardiac Imaging °[facility] °[phone] Office °[phone] Cell ° °

## 2021-09-22 ENCOUNTER — Encounter: Payer: Self-pay | Admitting: Cardiovascular Disease

## 2021-09-22 ENCOUNTER — Ambulatory Visit (HOSPITAL_COMMUNITY)
Admission: RE | Admit: 2021-09-22 | Discharge: 2021-09-22 | Disposition: A | Discharge status: Home / Self Care | Payer: Medicare Other | Source: Ambulatory Visit | Attending: Cardiology | Admitting: Cardiology

## 2021-09-22 DIAGNOSIS — I4819 Other persistent atrial fibrillation: Secondary | ICD-10-CM | POA: Insufficient documentation

## 2021-09-22 MED ORDER — NITROGLYCERIN 0.4 MG SL SUBL
SUBLINGUAL_TABLET | SUBLINGUAL | Status: AC
  Filled 2021-09-22: qty 2

## 2021-09-22 MED ORDER — IOHEXOL 350 MG/ML SOLN
100.0000 mL | Freq: Once | INTRAVENOUS | Status: AC | PRN
  Administered 2021-09-22: 100 mL via INTRAVENOUS

## 2021-09-26 ENCOUNTER — Encounter: Payer: Self-pay | Admitting: Cardiology

## 2021-09-28 ENCOUNTER — Encounter: Payer: Self-pay | Admitting: *Deleted

## 2021-09-28 NOTE — Anesthesia Preprocedure Evaluation (Addendum)
Anesthesia Evaluation  Patient identified by MRN, date of birth, ID band Patient awake    Reviewed: Allergy & Precautions, NPO status , Patient's Chart, lab work & pertinent test results  Airway Mallampati: III  TM Distance: >3 FB Neck ROM: Full    Dental no notable dental hx.    Pulmonary neg pulmonary ROS,    Pulmonary exam normal        Cardiovascular hypertension, Pt. on medications and Pt. on home beta blockers + CAD, + Past MI and + Cardiac Stents  Normal cardiovascular exam+ dysrhythmias Atrial Fibrillation      Neuro/Psych negative neurological ROS  negative psych ROS   GI/Hepatic negative GI ROS, Neg liver ROS,   Endo/Other  negative endocrine ROS  Renal/GU negative Renal ROS     Musculoskeletal  (+) Arthritis ,   Abdominal   Peds  Hematology negative hematology ROS (+)   Anesthesia Other Findings A-fib  Reproductive/Obstetrics                            Anesthesia Physical Anesthesia Plan  ASA: 3  Anesthesia Plan: General   Post-op Pain Management:    Induction: Intravenous  PONV Risk Score and Plan: 2 and Ondansetron, Dexamethasone and Treatment may vary due to age or medical condition  Airway Management Planned: Oral ETT  Additional Equipment:   Intra-op Plan:   Post-operative Plan: Extubation in OR  Informed Consent: I have reviewed the patients History and Physical, chart, labs and discussed the procedure including the risks, benefits and alternatives for the proposed anesthesia with the patient or authorized representative who has indicated his/her understanding and acceptance.     Dental advisory given  Plan Discussed with: CRNA  Anesthesia Plan Comments:        Anesthesia Quick Evaluation

## 2021-09-28 NOTE — Pre-Procedure Instructions (Signed)
Instructed patient on the following items: Arrival time 0530 Nothing to eat or drink after midnight No meds AM of procedure Responsible person to drive you home and stay with you for 24 hrs  Have you missed any doses of anti-coagulant Eliquis- hasn't missed any doses    

## 2021-09-29 ENCOUNTER — Ambulatory Visit (HOSPITAL_COMMUNITY): Payer: Medicare Other | Admitting: Certified Registered Nurse Anesthetist

## 2021-09-29 ENCOUNTER — Encounter (HOSPITAL_COMMUNITY)
Admission: RE | Disposition: A | Discharge status: Home / Self Care | Payer: Self-pay | Source: Home / Self Care | Attending: Cardiology

## 2021-09-29 ENCOUNTER — Encounter (HOSPITAL_COMMUNITY): Payer: Self-pay | Admitting: Cardiology

## 2021-09-29 ENCOUNTER — Ambulatory Visit (HOSPITAL_BASED_OUTPATIENT_CLINIC_OR_DEPARTMENT_OTHER): Payer: Medicare Other | Admitting: Certified Registered Nurse Anesthetist

## 2021-09-29 ENCOUNTER — Other Ambulatory Visit: Payer: Self-pay

## 2021-09-29 ENCOUNTER — Ambulatory Visit (HOSPITAL_COMMUNITY)
Admission: RE | Admit: 2021-09-29 | Discharge: 2021-09-29 | Disposition: A | Discharge status: Home / Self Care | Payer: Medicare Other | Attending: Cardiology | Admitting: Cardiology

## 2021-09-29 DIAGNOSIS — I252 Old myocardial infarction: Secondary | ICD-10-CM

## 2021-09-29 DIAGNOSIS — I10 Essential (primary) hypertension: Secondary | ICD-10-CM

## 2021-09-29 DIAGNOSIS — I4891 Unspecified atrial fibrillation: Secondary | ICD-10-CM

## 2021-09-29 DIAGNOSIS — E785 Hyperlipidemia, unspecified: Secondary | ICD-10-CM | POA: Diagnosis not present

## 2021-09-29 DIAGNOSIS — Z955 Presence of coronary angioplasty implant and graft: Secondary | ICD-10-CM | POA: Insufficient documentation

## 2021-09-29 DIAGNOSIS — I2510 Atherosclerotic heart disease of native coronary artery without angina pectoris: Secondary | ICD-10-CM | POA: Insufficient documentation

## 2021-09-29 DIAGNOSIS — I4819 Other persistent atrial fibrillation: Secondary | ICD-10-CM | POA: Insufficient documentation

## 2021-09-29 HISTORY — PX: ATRIAL FIBRILLATION ABLATION: EP1191

## 2021-09-29 SURGERY — ATRIAL FIBRILLATION ABLATION
Anesthesia: General

## 2021-09-29 MED ORDER — DOBUTAMINE INFUSION FOR EP/ECHO/NUC (1000 MCG/ML)
INTRAVENOUS | Status: AC
  Filled 2021-09-29: qty 250

## 2021-09-29 MED ORDER — LIDOCAINE 2% (20 MG/ML) 5 ML SYRINGE
INTRAMUSCULAR | Status: DC | PRN
  Administered 2021-09-29: 60 mg via INTRAVENOUS

## 2021-09-29 MED ORDER — ACETAMINOPHEN 325 MG PO TABS: 650.0000 mg | ORAL_TABLET | ORAL | Status: DC | PRN

## 2021-09-29 MED ORDER — PROPOFOL 10 MG/ML IV BOLUS
INTRAVENOUS | Status: DC | PRN
  Administered 2021-09-29: 200 mg via INTRAVENOUS

## 2021-09-29 MED ORDER — SODIUM CHLORIDE 0.9% FLUSH: 3.0000 mL | Freq: Two times a day (BID) | INTRAVENOUS | Status: DC

## 2021-09-29 MED ORDER — PHENYLEPHRINE HCL-NACL 20-0.9 MG/250ML-% IV SOLN
INTRAVENOUS | Status: DC | PRN
  Administered 2021-09-29: 15 ug/min via INTRAVENOUS

## 2021-09-29 MED ORDER — SODIUM CHLORIDE 0.9 % IV SOLN: 250.0000 mL | INTRAVENOUS | Status: DC | PRN

## 2021-09-29 MED ORDER — HEPARIN (PORCINE) IN NACL 1000-0.9 UT/500ML-% IV SOLN
INTRAVENOUS | Status: DC | PRN
  Administered 2021-09-29 (×4): 500 mL

## 2021-09-29 MED ORDER — DEXAMETHASONE SODIUM PHOSPHATE 10 MG/ML IJ SOLN
INTRAMUSCULAR | Status: DC | PRN
  Administered 2021-09-29: 10 mg via INTRAVENOUS

## 2021-09-29 MED ORDER — HEPARIN SODIUM (PORCINE) 1000 UNIT/ML IJ SOLN
INTRAMUSCULAR | Status: DC | PRN
  Administered 2021-09-29: 15000 [IU] via INTRAVENOUS
  Administered 2021-09-29: 7000 [IU] via INTRAVENOUS

## 2021-09-29 MED ORDER — ACETAMINOPHEN 500 MG PO TABS
1000.0000 mg | ORAL_TABLET | Freq: Once | ORAL | Status: AC
Start: 2021-09-29 — End: 2021-09-29
  Administered 2021-09-29: 1000 mg via ORAL
  Filled 2021-09-29: qty 2

## 2021-09-29 MED ORDER — PHENYLEPHRINE 80 MCG/ML (10ML) SYRINGE FOR IV PUSH (FOR BLOOD PRESSURE SUPPORT)
PREFILLED_SYRINGE | INTRAVENOUS | Status: DC | PRN
  Administered 2021-09-29 (×4): 80 ug via INTRAVENOUS

## 2021-09-29 MED ORDER — DOBUTAMINE INFUSION FOR EP/ECHO/NUC (1000 MCG/ML)
INTRAVENOUS | Status: DC | PRN
  Administered 2021-09-29: 20 ug/kg/min via INTRAVENOUS

## 2021-09-29 MED ORDER — ONDANSETRON HCL 4 MG/2ML IJ SOLN: 4.0000 mg | Freq: Four times a day (QID) | INTRAMUSCULAR | Status: DC | PRN

## 2021-09-29 MED ORDER — SODIUM CHLORIDE 0.9% FLUSH: 3.0000 mL | INTRAVENOUS | Status: DC | PRN

## 2021-09-29 MED ORDER — ONDANSETRON HCL 4 MG/2ML IJ SOLN
INTRAMUSCULAR | Status: DC | PRN
  Administered 2021-09-29: 4 mg via INTRAVENOUS

## 2021-09-29 MED ORDER — FENTANYL CITRATE (PF) 100 MCG/2ML IJ SOLN
INTRAMUSCULAR | Status: DC | PRN
  Administered 2021-09-29: 100 ug via INTRAVENOUS

## 2021-09-29 MED ORDER — SODIUM CHLORIDE 0.9 % IV SOLN: INTRAVENOUS | Status: DC

## 2021-09-29 MED ORDER — HEPARIN SODIUM (PORCINE) 1000 UNIT/ML IJ SOLN
INTRAMUSCULAR | Status: AC
  Filled 2021-09-29: qty 10

## 2021-09-29 MED ORDER — PROTAMINE SULFATE 10 MG/ML IV SOLN
INTRAVENOUS | Status: DC | PRN
  Administered 2021-09-29: 40 mg via INTRAVENOUS

## 2021-09-29 MED ORDER — APIXABAN 5 MG PO TABS
5.0000 mg | ORAL_TABLET | Freq: Once | ORAL | Status: AC
  Administered 2021-09-29: 5 mg via ORAL
  Filled 2021-09-29: qty 1

## 2021-09-29 MED ORDER — SUGAMMADEX SODIUM 200 MG/2ML IV SOLN
INTRAVENOUS | Status: DC | PRN
  Administered 2021-09-29: 200 mg via INTRAVENOUS

## 2021-09-29 MED ORDER — HEPARIN SODIUM (PORCINE) 1000 UNIT/ML IJ SOLN
INTRAMUSCULAR | Status: DC | PRN
  Administered 2021-09-29: 1000 [IU] via INTRAVENOUS

## 2021-09-29 MED ORDER — ROCURONIUM BROMIDE 10 MG/ML (PF) SYRINGE
PREFILLED_SYRINGE | INTRAVENOUS | Status: DC | PRN
  Administered 2021-09-29: 60 mg via INTRAVENOUS

## 2021-09-29 SURGICAL SUPPLY — 20 items
BLANKET WARM UNDERBOD FULL ACC (MISCELLANEOUS) ×1 IMPLANT
CATH 8FR REPROCESSED SOUNDSTAR (CATHETERS) ×1 IMPLANT
CATH 8FR SOUNDSTAR REPROCESSED (CATHETERS) IMPLANT
CATH ABLAT QDOT MICRO BI TC DF (CATHETERS) IMPLANT
CATH OCTARAY 2.0 F 3-3-3-3-3 (CATHETERS) IMPLANT
CATH PIGTAIL STEERABLE D1 8.7 (WIRE) IMPLANT
CATH S-M CIRCA TEMP PROBE (CATHETERS) IMPLANT
CATH WEB BI DIR CSDF CRV REPRO (CATHETERS) IMPLANT
CLOSURE PERCLOSE PROSTYLE (VASCULAR PRODUCTS) IMPLANT
COVER SWIFTLINK CONNECTOR (BAG) ×1 IMPLANT
PACK EP LATEX FREE (CUSTOM PROCEDURE TRAY) ×1
PACK EP LF (CUSTOM PROCEDURE TRAY) ×1 IMPLANT
PAD DEFIB RADIO PHYSIO CONN (PAD) ×1 IMPLANT
PATCH CARTO3 (PAD) IMPLANT
SHEATH CARTO VIZIGO SM CVD (SHEATH) IMPLANT
SHEATH PINNACLE 7F 10CM (SHEATH) IMPLANT
SHEATH PINNACLE 8F 10CM (SHEATH) IMPLANT
SHEATH PINNACLE 9F 10CM (SHEATH) IMPLANT
SHEATH PROBE COVER 6X72 (BAG) IMPLANT
TUBING SMART ABLATE COOLFLOW (TUBING) IMPLANT

## 2021-09-29 NOTE — Anesthesia Postprocedure Evaluation (Signed)
Anesthesia Post Note  Patient: Paul Acosta  Procedure(s) Performed: ATRIAL FIBRILLATION ABLATION     Patient location during evaluation: Cath Lab Anesthesia Type: General Level of consciousness: awake Pain management: pain level controlled Vital Signs Assessment: post-procedure vital signs reviewed and stable Respiratory status: spontaneous breathing, nonlabored ventilation, respiratory function stable and patient connected to nasal cannula oxygen Cardiovascular status: blood pressure returned to baseline and stable Postop Assessment: no apparent nausea or vomiting Anesthetic complications: no   There were no known notable events for this encounter.  Last Vitals:  Vitals:   09/29/21 1300 09/29/21 1315  BP: 116/73   Pulse: 77 80  Resp: 13   Temp:    SpO2: 96% 96%    Last Pain:  Vitals:   09/29/21 1015  TempSrc:   PainSc: 0-No pain                 Violett Hobbs P Marleta Lapierre

## 2021-09-29 NOTE — H&P (Signed)
Electrophysiology Office Note   Date:  09/29/2021   ID:  Paul Acosta, DOB 1950/04/19, MRN [id number]  PCP:  Maury Dus, MD  Cardiologist:  Burt Knack Primary Electrophysiologist:  Pyper Olexa [name], MD    Chief Complaint: AF   History of Present Illness: Paul Acosta is a 71 y.o. male who is being seen today for the evaluation of AF at the request of No ref. provider found. Presenting today for electrophysiology evaluation.  He has a history significant for coronary artery disease status post LAD stent, dilated aortic root, hypertension, hyperlipidemia, persistent atrial fibrillation.    Today, denies symptoms of palpitations, chest pain, shortness of breath, orthopnea, PND, lower extremity edema, claudication, dizziness, presyncope, syncope, bleeding, or neurologic sequela. The patient is tolerating medications without difficulties. Plan AF ablation today.    Past Medical History:  Diagnosis Date   Basal cell carcinoma 01/28/2015   R post auricular neck - ED&C    Dysplastic nevus 07/27/2009   R med pectoral - mild    Hypertension    Medical history non-contributory    Past Surgical History:  Procedure Laterality Date   EYE SURGERY     LEFT HEART CATH AND CORONARY ANGIOGRAPHY N/A 04/21/2019   Procedure: LEFT HEART CATH AND CORONARY ANGIOGRAPHY;  Surgeon: Troy Sine, MD;  Location: [facility];  Service: Cardiovascular;  Laterality: N/A;   TOTAL HIP ARTHROPLASTY Right 11/07/2013   Procedure: RIGHT TOTAL HIP ARTHROPLASTY;  Surgeon: Kerin Salen, MD;  Location: [place];  Service: Orthopedics;  Laterality: Right;     Current Facility-Administered Medications  Medication Dose Route Frequency Provider Last Rate Last Admin   0.9 %  sodium chloride infusion   Intravenous Continuous Constance Haw, MD 50 mL/hr at 09/29/21 0540 New Bag at 09/29/21 0540    Allergies:   Patient has no known allergies.   Social History:  The patient  reports that he has never  smoked. He has never used smokeless tobacco. He reports current alcohol use of about 4.0 - 5.0 standard drinks of alcohol per week. He reports that he does not use drugs.   Family History:  The patient's family history includes Atrial fibrillation in his brother; Heart attack (age of onset: 44) in his father; Stroke (age of onset: 75) in his mother.   ROS:  Please see the history of present illness.   Otherwise, review of systems is positive for none.   All other systems are reviewed and negative.   PHYSICAL EXAM: VS:  BP 129/82   Pulse 87   Temp 97.8 F (36.6 C) (Temporal)   Resp 17   Ht '6\' 6"'$  (1.981 m)   Wt 104.3 kg   SpO2 95%   BMI 26.58 kg/m  , BMI Body mass index is 26.58 kg/m. GEN: Well nourished, well developed, in no acute distress  HEENT: normal  Neck: no JVD, carotid bruits, or masses Cardiac: irregular; no murmurs, rubs, or gallops,no edema  Respiratory:  clear to auscultation bilaterally, normal work of breathing GI: soft, nontender, nondistended, + BS MS: no deformity or atrophy  Skin: warm and dry Neuro:  Strength and sensation are intact Psych: euthymic mood, full affect   Recent Labs: 09/16/2021: BUN 21; Creatinine, Ser 0.96; Hemoglobin 13.2; Platelets 201; Potassium 4.4; Sodium 140    Lipid Panel     Component Value Date/Time   CHOL 112 04/14/2020 0733   TRIG 63 04/14/2020 0733   HDL 50 04/14/2020 0733  CHOLHDL 2.2 04/14/2020 0733   CHOLHDL 3.6 04/19/2019 1819   VLDL 33 04/19/2019 1819   LDLCALC 48 04/14/2020 0733     Wt Readings from Last 3 Encounters:  09/29/21 104.3 kg  06/14/21 107 kg  04/18/21 106.6 kg      Other studies Reviewed: Additional studies/ records that were reviewed today include: TTE 04/14/20  Review of the above records today demonstrates:   1. Left ventricular ejection fraction, by estimation, is 55 to 60%. Left  ventricular ejection fraction by PLAX is 59 %. The left ventricle has  normal function. The left ventricle has no  regional wall motion  abnormalities. There is mild concentric left  ventricular hypertrophy. Left ventricular diastolic parameters were  normal.   2. Right ventricular systolic function is normal. The right ventricular  size is normal.   3. Left atrial size was mildly dilated.   4. The mitral valve is normal in structure. Trivial mitral valve  regurgitation. No evidence of mitral stenosis.   5. The aortic valve is tricuspid. Aortic valve regurgitation is not  visualized. No aortic stenosis is present.   6. Aortic dilatation noted. There is mild dilatation of the aortic root,  measuring 42 mm. There is mild dilatation of the ascending aorta,  measuring 41 mm.   7. The inferior vena cava is normal in size with greater than 50%  respiratory variability, suggesting right atrial pressure of 3 mmHg.   Cardiac monitor 05/19/2021 personally reviewed persistent atrial fibrillation throughout recording. Rare PVC's and ventricular runs up to 5 beats.   ASSESSMENT AND PLAN:  1.  Persistent atrial fibrillation: Paul Acosta has presented today for surgery, with the diagnosis of AF.  The various methods of treatment have been discussed with the patient and family. After consideration of risks, benefits and other options for treatment, the patient has consented to  Procedure(s): Catheter ablation as a surgical intervention .  Risks include but not limited to complete heart block, stroke, esophageal damage, nerve damage, bleeding, vascular damage, tamponade, perforation, MI, and death. The patient's history has been reviewed, patient examined, no change in status, stable for surgery.  I have reviewed the patient's chart and labs.  Questions were answered to the patient's satisfaction.    Tam Delisle Curt Bears, MD 09/29/2021 7:07 AM

## 2021-09-29 NOTE — Anesthesia Procedure Notes (Addendum)
Procedure Name: Intubation Date/Time: 09/29/2021 7:43 AM  Performed by: Dorthea Cove, CRNAPre-anesthesia Checklist: Patient identified, Emergency Drugs available, Suction available and Patient being monitored Patient Re-evaluated:Patient Re-evaluated prior to induction Oxygen Delivery Method: Circle system utilized Preoxygenation: Pre-oxygenation with 100% oxygen Induction Type: IV induction Ventilation: Two handed mask ventilation required and Mask ventilation without difficulty Laryngoscope Size: Glidescope and 4 Grade View: Grade I Tube type: Oral Tube size: 7.5 mm Number of attempts: 2 Airway Equipment and Method: Stylet, Oral airway and Video-laryngoscopy Placement Confirmation: ETT inserted through vocal cords under direct vision, positive ETCO2 and breath sounds checked- equal and bilateral Secured at: 24 cm Tube secured with: Tape Dental Injury: Teeth and Oropharynx as per pre-operative assessment  Comments: Grade 3 view with MAC 4. Pt. tall with deep airway. Easy grade I Glide go intubation. VSS throughout

## 2021-09-29 NOTE — Discharge Instructions (Addendum)
Cardiac Ablation, Care After  This sheet gives you information about how to care for yourself after your procedure. Your health care provider may also give you more specific instructions. If you have problems or questions, contact your health care provider. What can I expect after the procedure? After the procedure, it is common to have: Bruising around your puncture site. Tenderness around your puncture site. Skipped heartbeats. Tiredness (fatigue).  Follow these instructions at home: Puncture site care  Follow instructions from your health care provider about how to take care of your puncture site. Make sure you: If present, leave stitches (sutures), skin glue, or adhesive strips in place. These skin closures may need to stay in place for up to 2 weeks. If adhesive strip edges start to loosen and curl up, you may trim the loose edges. Do not remove adhesive strips completely unless your health care provider tells you to do that. If a large square bandage is present, this may be removed 24 hours after surgery.  Check your puncture site every day for signs of infection. Check for: Redness, swelling, or pain. Fluid or blood. If your puncture site starts to bleed, lie down on your back, apply firm pressure to the area, and contact your health care provider. Warmth. Pus or a bad smell. A pea or small marble sized lump at the site is normal and can take up to three months to resolve.  Driving Do not drive for at least 4 days after your procedure or however long your health care provider recommends. (Do not resume driving if you have previously been instructed not to drive for other health reasons.) Do not drive or use heavy machinery while taking prescription pain medicine. Activity Avoid activities that take a lot of effort for at least 7 days after your procedure. Do not lift anything that is heavier than 5 lb (4.5 kg) for one week.  No sexual activity for 1 week.  Return to your normal  activities as told by your health care provider. Ask your health care provider what activities are safe for you. General instructions Take over-the-counter and prescription medicines only as told by your health care provider. Do not use any products that contain nicotine or tobacco, such as cigarettes and e-cigarettes. If you need help quitting, ask your health care provider. You may shower after 24 hours, but Do not take baths, swim, or use a hot tub for 1 week.  Do not drink alcohol for 24 hours after your procedure. Keep all follow-up visits as told by your health care provider. This is important. Contact a health care provider if: You have redness, mild swelling, or pain around your puncture site. You have fluid or blood coming from your puncture site that stops after applying firm pressure to the area. Your puncture site feels warm to the touch. You have pus or a bad smell coming from your puncture site. You have a fever. You have chest pain or discomfort that spreads to your neck, jaw, or arm. You are sweating a lot. You feel nauseous. You have a fast or irregular heartbeat. You have shortness of breath. You are dizzy or light-headed and feel the need to lie down. You have pain or numbness in the arm or leg closest to your puncture site. Get help right away if: Your puncture site suddenly swells. Your puncture site is bleeding and the bleeding does not stop after applying firm pressure to the area. These symptoms may represent a serious problem that is   an emergency. Do not wait to see if the symptoms will go away. Get medical help right away. Call your local emergency services (911 in the [place] Do not drive yourself to the hospital. Summary After the procedure, it is normal to have bruising and tenderness at the puncture site in your groin, neck, or forearm. Check your puncture site every day for signs of infection. Get help right away if your puncture site is bleeding and the  bleeding does not stop after applying firm pressure to the area. This is a medical emergency. This information is not intended to replace advice given to you by your health care provider. Make sure you discuss any questions you have with your health care provider.  You have an appointment set up with the [facility].  Multiple studies have shown that being followed by a dedicated atrial fibrillation clinic in addition to the standard care you receive from your other physicians improves health. We believe that enrollment in the atrial fibrillation clinic will allow [place] to better care for you.   The phone number to the [facility] is [phone]. The clinic is staffed Monday through Friday from 8:30am to 5pm.  Parking Directions: The clinic is located in the Heart and Vascular Building connected to [facility]. 1)From [address] turn on to [address] and go to the 3rd entrance  (Heart and Vascular entrance) on the right. 2)Look to the right for Heart &Vascular Parking Garage. 3)A code for the entrance is required, for October is 1504.   4)Take the elevators to the 1st floor. Registration is in the room with the glass walls at the end of the hallway.  If you have any trouble parking or locating the clinic, please don't hesitate to call [phone].

## 2021-09-29 NOTE — Transfer of Care (Signed)
Immediate Anesthesia Transfer of Care Note  Patient: Paul Acosta  Procedure(s) Performed: ATRIAL FIBRILLATION ABLATION  Patient Location: Cath Lab  Anesthesia Type:General  Level of Consciousness: awake, alert  and oriented  Airway & Oxygen Therapy: Patient Spontanous Breathing and Patient connected to nasal cannula oxygen  Post-op Assessment: Report given to RN and Post -op Vital signs reviewed and stable  Post vital signs: Reviewed and stable  Last Vitals:  Vitals Value Taken Time  BP 133/79 09/29/21 0932  Temp    Pulse 82 09/29/21 0932  Resp 16 09/29/21 0932  SpO2 93 % 09/29/21 0932  Vitals shown include unvalidated device data.  Last Pain:  Vitals:   09/29/21 0605  TempSrc: Temporal  PainSc:          Complications: There were no known notable events for this encounter.

## 2021-09-30 ENCOUNTER — Encounter (HOSPITAL_COMMUNITY): Payer: Self-pay | Admitting: Cardiology

## 2021-09-30 LAB — POCT ACTIVATED CLOTTING TIME: Activated Clotting Time: 263 seconds

## 2021-10-05 NOTE — Telephone Encounter (Signed)
Spoke to pt, advised to take groin bandages off. Pt reports he did yesterday. Advised to let me know if any redness/drainage/odor occurs from the sites. Patient verbalized understanding and agreeable to plan.

## 2021-10-17 DIAGNOSIS — M25552 Pain in left hip: Secondary | ICD-10-CM | POA: Diagnosis not present

## 2021-10-17 DIAGNOSIS — M5416 Radiculopathy, lumbar region: Secondary | ICD-10-CM | POA: Diagnosis not present

## 2021-10-17 DIAGNOSIS — M5136 Other intervertebral disc degeneration, lumbar region: Secondary | ICD-10-CM | POA: Diagnosis not present

## 2021-10-23 DIAGNOSIS — M1990 Unspecified osteoarthritis, unspecified site: Secondary | ICD-10-CM | POA: Diagnosis not present

## 2021-10-27 ENCOUNTER — Ambulatory Visit (HOSPITAL_COMMUNITY)
Admit: 2021-10-27 | Discharge: 2021-10-27 | Disposition: A | Discharge status: Home / Self Care | Payer: Medicare Other | Source: Ambulatory Visit | Attending: Physician Assistant | Admitting: Physician Assistant

## 2021-10-27 ENCOUNTER — Encounter (HOSPITAL_COMMUNITY): Payer: Self-pay | Admitting: Physician Assistant

## 2021-10-27 VITALS — BP 138/82 | HR 80 | Ht 78.0 in | Wt 250.2 lb

## 2021-10-27 DIAGNOSIS — I10 Essential (primary) hypertension: Secondary | ICD-10-CM | POA: Diagnosis not present

## 2021-10-27 DIAGNOSIS — D6869 Other thrombophilia: Secondary | ICD-10-CM | POA: Insufficient documentation

## 2021-10-27 DIAGNOSIS — E785 Hyperlipidemia, unspecified: Secondary | ICD-10-CM | POA: Insufficient documentation

## 2021-10-27 DIAGNOSIS — I2510 Atherosclerotic heart disease of native coronary artery without angina pectoris: Secondary | ICD-10-CM | POA: Diagnosis not present

## 2021-10-27 DIAGNOSIS — I4819 Other persistent atrial fibrillation: Secondary | ICD-10-CM | POA: Insufficient documentation

## 2021-10-27 DIAGNOSIS — M4120 Other idiopathic scoliosis, site unspecified: Secondary | ICD-10-CM | POA: Diagnosis not present

## 2021-10-27 DIAGNOSIS — Z7901 Long term (current) use of anticoagulants: Secondary | ICD-10-CM | POA: Insufficient documentation

## 2021-10-27 DIAGNOSIS — Z6826 Body mass index (BMI) 26.0-26.9, adult: Secondary | ICD-10-CM | POA: Diagnosis not present

## 2021-10-27 NOTE — Progress Notes (Signed)
Primary Care Physician: Maury Dus, MD Primary Cardiologist: Dr Burt Knack Primary Electrophysiologist: Dr Curt Bears  Referring Physician: Richardson Dopp PA   Paul Acosta is a 71 y.o. male with a history of CAD, HTN, HLD, atrial fibrillation who presents for follow up in the [facility]. The patient was initially diagnosed with atrial fibrillation 12/21/20 after presenting to [facility] with his smart watch alerting him that he was in afib. He was not symptomatic with his arrhythmia. He was on prednisone at the time. Patient was started on Eliquis for a CHADS2VASC score of 3.  He denies significant snoring and only drinks alcohol occasionally. He had recurrent persistent afib noted on a cardiac monitor and underwent afib ablation with Dr Curt Bears on 09/29/21.  On follow up today, patient reports that he has done well since the ablation with no afib seen on his Apple Watch. He does feel that he has more energy since the ablation. He denies chest pain, swallowing pain, or groin issues.   Today, he denies symptoms of palpitations, chest pain, shortness of breath, orthopnea, PND, lower extremity edema, dizziness, presyncope, syncope, snoring, daytime somnolence, bleeding, or neurologic sequela. The patient is tolerating medications without difficulties and is otherwise without complaint today.    Atrial Fibrillation Risk Factors:  he does not have symptoms or diagnosis of sleep apnea. he does not have a history of rheumatic fever. he does have a history of alcohol use. The patient does have a history of early familial atrial fibrillation or other arrhythmias. Brother has afib.  he has a BMI of Body mass index is 28.91 kg/m.Marland Kitchen Filed Weights   10/27/21 1015  Weight: 113.5 kg    Family History  Problem Relation Age of Onset   Stroke Mother 78   Heart attack Father 53   Atrial fibrillation Brother      Atrial Fibrillation Management history:  Previous  antiarrhythmic drugs: none Previous cardioversions: none Previous ablations: 09/29/21 CHADS2VASC score: 3 Anticoagulation history: Eliquis   Past Medical History:  Diagnosis Date   Basal cell carcinoma 01/28/2015   R post auricular neck - ED&C    Dysplastic nevus 07/27/2009   R med pectoral - mild    Hypertension    Medical history non-contributory    Past Surgical History:  Procedure Laterality Date   ATRIAL FIBRILLATION ABLATION N/A 09/29/2021   Procedure: ATRIAL FIBRILLATION ABLATION;  Surgeon: Constance Haw, MD;  Location: [facility];  Service: Cardiovascular;  Laterality: N/A;   EYE SURGERY     LEFT HEART CATH AND CORONARY ANGIOGRAPHY N/A 04/21/2019   Procedure: LEFT HEART CATH AND CORONARY ANGIOGRAPHY;  Surgeon: Troy Sine, MD;  Location: [facility];  Service: Cardiovascular;  Laterality: N/A;   TOTAL HIP ARTHROPLASTY Right 11/07/2013   Procedure: RIGHT TOTAL HIP ARTHROPLASTY;  Surgeon: Kerin Salen, MD;  Location: [facility];  Service: Orthopedics;  Laterality: Right;    Current Outpatient Medications  Medication Sig Dispense Refill   acetaminophen (TYLENOL) 650 MG CR tablet Take 1,300 mg by mouth every 8 (eight) hours as needed for pain.     amLODipine (NORVASC) 5 MG tablet TAKE ONE TABLET BY MOUTH DAILY 90 tablet 2   apixaban (ELIQUIS) 5 MG TABS tablet Take 1 tablet (5 mg total) by mouth 2 (two) times daily. 180 tablet 3   ascorbic acid (VITAMIN C) 1000 MG tablet Take 1,000 mg by mouth daily.     Cholecalciferol (VITAMIN D3 GUMMIES PO) Take  5,000 Units by mouth daily.     fluorouracil (EFUDEX) 5 % cream Apply topically 2 (two) times daily. X 1 week to scalp 15 g 0   fluticasone (FLONASE) 50 MCG/ACT nasal spray Place 1 spray into both nostrils daily as needed for allergies or rhinitis.     ketoconazole (NIZORAL) 2 % cream Apply to affected areas at scalp three times weekly at bedtime. 30 g 1   losartan (COZAAR) 50 MG tablet Take 50 mg by mouth daily.      nitroGLYCERIN (NITROSTAT) 0.4 MG SL tablet Place 1 tablet (0.4 mg total) under the tongue every 5 (five) minutes as needed for chest pain. 30 tablet 0   rosuvastatin (CRESTOR) 40 MG tablet TAKE ONE TABLET BY MOUTH DAILY AT 6 IN THE EVENING 90 tablet 3   Turmeric 500 MG CAPS Take 1 capsule by mouth daily.     Zinc 100 MG TABS Take 100 mg by mouth daily.     No current facility-administered medications for this encounter.    No Known Allergies  Social History   Socioeconomic History   Marital status: Married    Spouse name: Not on file   Number of children: Not on file   Years of education: Not on file   Highest education level: Not on file  Occupational History   Not on file  Tobacco Use   Smoking status: Never   Smokeless tobacco: Never   Tobacco comments:    Never smoke 10/27/21  Substance and Sexual Activity   Alcohol use: Yes    Alcohol/week: 6.0 standard drinks of alcohol    Types: 6 Cans of beer per week    Comment: 6 drinks a week 10/27/21   Drug use: Never   Sexual activity: Not on file  Other Topics Concern   Not on file  Social History Narrative   Not on file   Social Determinants of Health   Financial Resource Strain: Not on file  Food Insecurity: Not on file  Transportation Needs: Not on file  Physical Activity: Not on file  Stress: Not on file  Social Connections: Not on file  Intimate Partner Violence: Not on file     ROS- All systems are reviewed and negative except as per the HPI above.  Physical Exam: Vitals:   10/27/21 1015  BP: 138/82  Pulse: 80  Weight: 113.5 kg  Height: '6\' 6"'$  (1.981 m)     GEN- The patient is a well appearing male, alert and oriented x 3 today.   HEENT-head normocephalic, atraumatic, sclera clear, conjunctiva pink, hearing intact, trachea midline. Lungs- Clear to ausculation bilaterally, normal work of breathing Heart- Regular rate and rhythm, no murmurs, rubs or gallops  GI- soft, NT, ND, + BS Extremities- no  clubbing, cyanosis, or edema MS- no significant deformity or atrophy Skin- no rash or lesion Psych- euthymic mood, full affect Neuro- strength and sensation are intact   Wt Readings from Last 3 Encounters:  10/27/21 113.5 kg  09/29/21 104.3 kg  06/14/21 107 kg    EKG today demonstrates  SR, RBBB Vent. rate 80 BPM PR interval 178 ms QRS duration 136 ms QT/QTcB 402/463 ms  Echo 04/14/20 demonstrated   1. Left ventricular ejection fraction, by estimation, is 55 to 60%. Left  ventricular ejection fraction by PLAX is 59 %. The left ventricle has  normal function. The left ventricle has no regional wall motion  abnormalities. There is mild concentric left ventricular hypertrophy. Left ventricular diastolic parameters  were normal.   2. Right ventricular systolic function is normal. The right ventricular  size is normal.   3. Left atrial size was mildly dilated.   4. The mitral valve is normal in structure. Trivial mitral valve  regurgitation. No evidence of mitral stenosis.   5. The aortic valve is tricuspid. Aortic valve regurgitation is not  visualized. No aortic stenosis is present.   6. Aortic dilatation noted. There is mild dilatation of the aortic root,  measuring 42 mm. There is mild dilatation of the ascending aorta,  measuring 41 mm.   7. The inferior vena cava is normal in size with greater than 50%  respiratory variability, suggesting right atrial pressure of 3 mmHg.   Epic records are reviewed at length today  CHA2DS2-VASc Score = 3  The patient's score is based upon: CHF History: 0 HTN History: 1 Diabetes History: 0 Stroke History: 0 Vascular Disease History: 1 Age Score: 1 Gender Score: 0       ASSESSMENT AND PLAN: 1. Persistent atrial fibrillation  The patient's CHA2DS2-VASc score is 3, indicating a 3.2% annual risk of stroke.   S/p afib ablation 09/29/21 Patient appears to be maintaining SR. Continue Eliquis 5 mg BID with no missed doses for 3 months  post ablation.  Continue to monitor with smart watch.   2. Secondary Hypercoagulable State (ICD10:  D68.69) The patient is at significant risk for stroke/thromboembolism based upon his CHA2DS2-VASc Score of 3.  Continue Apixaban (Eliquis).   3. CAD No anginal symptoms.  4. HTN Stable, no changes today.   Follow up with Dr Curt Bears as scheduled.    [facility] [address] [place], [place] [address] [phone] 10/27/2021 11:52 AM

## 2021-11-08 DIAGNOSIS — M545 Low back pain, unspecified: Secondary | ICD-10-CM | POA: Diagnosis not present

## 2021-11-08 DIAGNOSIS — M4120 Other idiopathic scoliosis, site unspecified: Secondary | ICD-10-CM | POA: Diagnosis not present

## 2021-11-08 DIAGNOSIS — M47816 Spondylosis without myelopathy or radiculopathy, lumbar region: Secondary | ICD-10-CM | POA: Diagnosis not present

## 2021-11-22 DIAGNOSIS — M1990 Unspecified osteoarthritis, unspecified site: Secondary | ICD-10-CM | POA: Diagnosis not present

## 2021-11-24 DIAGNOSIS — M47816 Spondylosis without myelopathy or radiculopathy, lumbar region: Secondary | ICD-10-CM | POA: Diagnosis not present

## 2021-11-30 ENCOUNTER — Ambulatory Visit (INDEPENDENT_AMBULATORY_CARE_PROVIDER_SITE_OTHER): Payer: Medicare Other | Admitting: Dermatology

## 2021-11-30 DIAGNOSIS — L570 Actinic keratosis: Secondary | ICD-10-CM

## 2021-11-30 DIAGNOSIS — Z5111 Encounter for antineoplastic chemotherapy: Secondary | ICD-10-CM | POA: Diagnosis not present

## 2021-11-30 DIAGNOSIS — L219 Seborrheic dermatitis, unspecified: Secondary | ICD-10-CM | POA: Diagnosis not present

## 2021-11-30 DIAGNOSIS — Z79899 Other long term (current) drug therapy: Secondary | ICD-10-CM | POA: Diagnosis not present

## 2021-11-30 DIAGNOSIS — I2510 Atherosclerotic heart disease of native coronary artery without angina pectoris: Secondary | ICD-10-CM

## 2021-11-30 DIAGNOSIS — L578 Other skin changes due to chronic exposure to nonionizing radiation: Secondary | ICD-10-CM

## 2021-11-30 MED ORDER — KETOCONAZOLE 2 % EX CREA: TOPICAL_CREAM | CUTANEOUS | 6 refills | Status: DC

## 2021-11-30 NOTE — Patient Instructions (Signed)
Due to recent changes in healthcare laws, you may see results of your pathology and/or laboratory studies on MyChart before the doctors have had a chance to review them. We understand that in some cases there may be results that are confusing or concerning to you. Please understand that not all results are received at the same time and often the doctors may need to interpret multiple results in order to provide you with the best plan of care or course of treatment. Therefore, we ask that you please give us 2 business days to thoroughly review all your results before contacting the office for clarification. Should we see a critical lab result, you will be contacted sooner.   If You Need Anything After Your Visit  If you have any questions or concerns for your doctor, please call our main line at [phone] and press option 4 to reach your doctor's medical assistant. If no one answers, please leave a voicemail as directed and we will return your call as soon as possible. Messages left after 4 pm will be answered the following business day.   You may also send us a message via MyChart. We typically respond to MyChart messages within 1-2 business days.  For prescription refills, please ask your pharmacy to contact our office. Our fax number is [phone].  If you have an urgent issue when the clinic is closed that cannot wait until the next business day, you can page your doctor at the number below.    Please note that while we do our best to be available for urgent issues outside of office hours, we are not available 24/7.   If you have an urgent issue and are unable to reach us, you may choose to seek medical care at your doctor's office, retail clinic, urgent care center, or emergency room.  If you have a medical emergency, please immediately call 911 or go to the emergency department.  Pager Numbers  - Dr. Kowalski: [phone]  - Dr. Moye: [phone]  - Dr. Stewart:  [phone]  In the event of inclement weather, please call our main line at [phone] for an update on the status of any delays or closures.  Dermatology Medication Tips: Please keep the boxes that topical medications come in in order to help keep track of the instructions about where and how to use these. Pharmacies typically print the medication instructions only on the boxes and not directly on the medication tubes.   If your medication is too expensive, please contact our office at [phone] option 4 or send us a message through MyChart.   We are unable to tell what your co-pay for medications will be in advance as this is different depending on your insurance coverage. However, we may be able to find a substitute medication at lower cost or fill out paperwork to get insurance to cover a needed medication.   If a prior authorization is required to get your medication covered by your insurance company, please allow us 1-2 business days to complete this process.  Drug prices often vary depending on where the prescription is filled and some pharmacies may offer cheaper prices.  The website www.goodrx.com contains coupons for medications through different pharmacies. The prices here do not account for what the cost may be with help from insurance (it may be cheaper with your insurance), but the website can give you the price if you did not use any insurance.  - You can print the associated coupon and take it with   your prescription to the pharmacy.  - You may also stop by our office during regular business hours and pick up a GoodRx coupon card.  - If you need your prescription sent electronically to a different pharmacy, notify our office through [facility] MyChart or by phone at [phone] option 4.     Si Usted Necesita Algo Despus de Su Visita  Tambin puede enviarnos un mensaje a travs de MyChart. Por lo general respondemos a los mensajes de MyChart en el transcurso de 1 a 2  das hbiles.  Para renovar recetas, por favor pida a su farmacia que se ponga en contacto con nuestra oficina. Nuestro nmero de fax es el [phone].  Si tiene un asunto urgente cuando la clnica est cerrada y que no puede esperar hasta el siguiente da hbil, puede llamar/localizar a su doctor(a) al nmero que aparece a continuacin.   Por favor, tenga en cuenta que aunque hacemos todo lo posible para estar disponibles para asuntos urgentes fuera del horario de oficina, no estamos disponibles las 24 horas del da, los 7 das de la semana.   Si tiene un problema urgente y no puede comunicarse con nosotros, puede optar por buscar atencin mdica  en el consultorio de su doctor(a), en una clnica privada, en un centro de atencin urgente o en una sala de emergencias.  Si tiene una emergencia mdica, por favor llame inmediatamente al 911 o vaya a la sala de emergencias.  Nmeros de bper  - Dr. Kowalski: [phone]  - Dra. Moye: [phone]  - Dra. Stewart: [phone]  En caso de inclemencias del tiempo, por favor llame a nuestra lnea principal al [phone] para una actualizacin sobre el estado de cualquier retraso o cierre.  Consejos para la medicacin en dermatologa: Por favor, guarde las cajas en las que vienen los medicamentos de uso tpico para ayudarle a seguir las instrucciones sobre dnde y cmo usarlos. Las farmacias generalmente imprimen las instrucciones del medicamento slo en las cajas y no directamente en los tubos del medicamento.   Si su medicamento es muy caro, por favor, pngase en contacto con nuestra oficina llamando al [phone] y presione la opcin 4 o envenos un mensaje a travs de MyChart.   No podemos decirle cul ser su copago por los medicamentos por adelantado ya que esto es diferente dependiendo de la cobertura de su seguro. Sin embargo, es posible que podamos encontrar un medicamento sustituto a menor costo o llenar un formulario para que el  seguro cubra el medicamento que se considera necesario.   Si se requiere una autorizacin previa para que su compaa de seguros cubra su medicamento, por favor permtanos de 1 a 2 das hbiles para completar este proceso.  Los precios de los medicamentos varan con frecuencia dependiendo del lugar de dnde se surte la receta y alguna farmacias pueden ofrecer precios ms baratos.  El sitio web www.goodrx.com tiene cupones para medicamentos de diferentes farmacias. Los precios aqu no tienen en cuenta lo que podra costar con la ayuda del seguro (puede ser ms barato con su seguro), pero el sitio web puede darle el precio si no utiliz ningn seguro.  - Puede imprimir el cupn correspondiente y llevarlo con su receta a la farmacia.  - Tambin puede pasar por nuestra oficina durante el horario de atencin regular y recoger una tarjeta de cupones de GoodRx.  - Si necesita que su receta se enve electrnicamente a una farmacia diferente, informe a nuestra oficina a travs de MyChart de [facility]   o por telfono llamando al [phone] y presione la opcin 4.  

## 2021-11-30 NOTE — Progress Notes (Signed)
Follow-Up Visit   Subjective  Paul Acosta is a 71 y.o. male who presents for the following: Follow-up (6 months f/u on precancers on his scalp, patient was given a rx for 5FU/Calcipotriene at his last office visit and he never used. ). Refill Ketoconazole cream for rash on his scalp. The patient has spots, moles and lesions to be evaluated, some may be new or changing and the patient has concerns that these could be cancer.  The following portions of the chart were reviewed this encounter and updated as appropriate:   Tobacco  Allergies  Meds  Problems  Med Hx  Surg Hx  Fam Hx     Review of Systems:  No other skin or systemic complaints except as noted in HPI or Assessment and Plan.  Objective  Well appearing patient in no apparent distress; mood and affect are within normal limits.  A focused examination was performed including scalp. Relevant physical exam findings are noted in the Assessment and Plan.  Scalp x 5 (5) Erythematous thin papules/macules with gritty scale.   scalp Pink patches with greasy scale.    Assessment & Plan  AK (actinic keratosis) (5) Scalp x 5  Actinic keratoses are precancerous spots that appear secondary to cumulative UV radiation exposure/sun exposure over time. They are chronic with expected duration over 1 year. A portion of actinic keratoses will progress to squamous cell carcinoma of the skin. It is not possible to reliably predict which spots will progress to skin cancer and so treatment is recommended to prevent development of skin cancer.  Recommend daily broad spectrum sunscreen SPF 30+ to sun-exposed areas, reapply every 2 hours as needed.  Recommend staying in the shade or wearing long sleeves, sun glasses (UVA+UVB protection) and wide brim hats (4-inch brim around the entire circumference of the hat). Call for new or changing lesions.   Destruction of lesion - Scalp x 5 Complexity: simple   Destruction method: cryotherapy    Informed consent: discussed and consent obtained   Timeout:  patient name, date of birth, surgical site, and procedure verified Lesion destroyed using liquid nitrogen: Yes   Region frozen until ice ball extended beyond lesion: Yes   Outcome: patient tolerated procedure well with no complications   Post-procedure details: wound care instructions given    Related Medications fluorouracil (EFUDEX) 5 % cream Apply topically 2 (two) times daily. X 1 week to scalp  Seborrheic dermatitis scalp  Seborrheic Dermatitis  -  is a chronic persistent rash characterized by pinkness and scaling most commonly of the mid face but also can occur on the scalp (dandruff), ears; mid chest, mid back and groin.  It tends to be exacerbated by stress and cooler weather.  People who have neurologic disease may experience new onset or exacerbation of existing seborrheic dermatitis.  The condition is not curable but treatable and can be controlled.   Continue Ketoconazole cream apply to scalp 3 times a week at bedtime   Related Medications ketoconazole (NIZORAL) 2 % cream Apply to affected areas at scalp three times weekly at bedtime.  Actinic Damage with PreCancerous Actinic Keratoses Counseling for Topical Chemotherapy Management: Patient exhibits: - Severe, confluent actinic changes with pre-cancerous actinic keratoses that is secondary to cumulative UV radiation exposure over time - Condition that is severe; chronic, not at goal. - diffuse scaly erythematous macules and papules with underlying dyspigmentation - Discussed Prescription "Field Treatment" topical Chemotherapy for Severe, Chronic Confluent Actinic Changes with Pre-Cancerous Actinic Keratoses  Start  5FU/calcipotriene cream apply to scalp twice a day for 7 days   Field treatment involves treatment of an entire area of skin that has confluent Actinic Changes (Sun/ Ultraviolet light damage) and PreCancerous Actinic Keratoses by method of  PhotoDynamic Therapy (PDT) and/or prescription Topical Chemotherapy agents such as 5-fluorouracil, 5-fluorouracil/calcipotriene, and/or imiquimod.  The purpose is to decrease the number of clinically evident and subclinical PreCancerous lesions to prevent progression to development of skin cancer by chemically destroying early precancer changes that may or may not be visible.  It has been shown to reduce the risk of developing skin cancer in the treated area. As a result of treatment, redness, scaling, crusting, and open sores may occur during treatment course. One or more than one of these methods may be used and may have to be used several times to control, suppress and eliminate the PreCancerous changes. Discussed treatment course, expected reaction, and possible side effects. - Recommend daily broad spectrum sunscreen SPF 30+ to sun-exposed areas, reapply every 2 hours as needed.  - Staying in the shade or wearing long sleeves, sun glasses (UVA+UVB protection) and wide brim hats (4-inch brim around the entire circumference of the hat) are also recommended. - Call for new or changing lesions.   Return in about 6 months (around 05/31/2022) for Aks .  IMarye Round, CMA, am acting as scribe for [name], MD .  Documentation: I have reviewed the above documentation for accuracy and completeness, and I agree with the above.  [name], MD

## 2021-12-06 DIAGNOSIS — M47816 Spondylosis without myelopathy or radiculopathy, lumbar region: Secondary | ICD-10-CM | POA: Diagnosis not present

## 2021-12-13 ENCOUNTER — Encounter: Payer: Self-pay | Admitting: Dermatology

## 2022-01-06 ENCOUNTER — Ambulatory Visit: Payer: Medicare Other | Attending: Cardiology | Admitting: Cardiology

## 2022-01-06 ENCOUNTER — Encounter: Payer: Self-pay | Admitting: Cardiology

## 2022-01-06 VITALS — BP 138/80 | HR 78 | Ht 78.0 in | Wt 259.0 lb

## 2022-01-06 DIAGNOSIS — I4819 Other persistent atrial fibrillation: Secondary | ICD-10-CM | POA: Diagnosis not present

## 2022-01-06 DIAGNOSIS — D6869 Other thrombophilia: Secondary | ICD-10-CM | POA: Diagnosis not present

## 2022-01-06 DIAGNOSIS — I2510 Atherosclerotic heart disease of native coronary artery without angina pectoris: Secondary | ICD-10-CM

## 2022-01-06 NOTE — Progress Notes (Signed)
Electrophysiology Office Note   Date:  01/06/2022   ID:  Paul, Acosta 1950/07/03, MRN [id number]  PCP:  Maury Dus, MD  Cardiologist:  Burt Knack Primary Electrophysiologist:  Shawnte Demarest [name], MD    Chief Complaint: AF   History of Present Illness: Paul Acosta is a 71 y.o. male who is being seen today for the evaluation of AF at the request of Maury Dus, MD. Presenting today for electrophysiology evaluation.  He has a history significant for coronary artery disease post LAD stent, dilated aortic root, hypertension, hyperlipidemia, persistent atrial fibrillation.  He is post atrial fibrillation ablation 09/29/2021.  Today, denies symptoms of palpitations, chest pain, shortness of breath, orthopnea, PND, lower extremity edema, claudication, dizziness, presyncope, syncope, bleeding, or neurologic sequela. The patient is tolerating medications without difficulties.  Since his ablation he has done well.  He is noted no further episodes of atrial fibrillation.  He is able to do all of his daily activities without restriction.  Past Medical History:  Diagnosis Date   Basal cell carcinoma 01/28/2015   R post auricular neck - ED&C    Dysplastic nevus 07/27/2009   R med pectoral - mild    Hypertension    Medical history non-contributory    Past Surgical History:  Procedure Laterality Date   ATRIAL FIBRILLATION ABLATION N/A 09/29/2021   Procedure: ATRIAL FIBRILLATION ABLATION;  Surgeon: Constance Haw, MD;  Location: [place] [facility];  Service: Cardiovascular;  Laterality: N/A;   EYE SURGERY     LEFT HEART CATH AND CORONARY ANGIOGRAPHY N/A 04/21/2019   Procedure: LEFT HEART CATH AND CORONARY ANGIOGRAPHY;  Surgeon: Troy Sine, MD;  Location: [place] [facility];  Service: Cardiovascular;  Laterality: N/A;   TOTAL HIP ARTHROPLASTY Right 11/07/2013   Procedure: RIGHT TOTAL HIP ARTHROPLASTY;  Surgeon: Kerin Salen, MD;  Location: [facility];  Service: Orthopedics;   Laterality: Right;     Current Outpatient Medications  Medication Sig Dispense Refill   acetaminophen (TYLENOL) 650 MG CR tablet Take 1,300 mg by mouth every 8 (eight) hours as needed for pain.     amLODipine (NORVASC) 5 MG tablet TAKE ONE TABLET BY MOUTH DAILY 90 tablet 2   ascorbic acid (VITAMIN C) 1000 MG tablet Take 1,000 mg by mouth daily.     Cholecalciferol (VITAMIN D3 GUMMIES PO) Take 5,000 Units by mouth daily.     fluorouracil (EFUDEX) 5 % cream Apply topically 2 (two) times daily. X 1 week to scalp 15 g 0   fluticasone (FLONASE) 50 MCG/ACT nasal spray Place 1 spray into both nostrils daily as needed for allergies or rhinitis.     ketoconazole (NIZORAL) 2 % cream Apply to affected areas at scalp three times weekly at bedtime. 30 g 6   losartan (COZAAR) 50 MG tablet Take 50 mg by mouth daily.     nitroGLYCERIN (NITROSTAT) 0.4 MG SL tablet Place 1 tablet (0.4 mg total) under the tongue every 5 (five) minutes as needed for chest pain. 30 tablet 0   rosuvastatin (CRESTOR) 40 MG tablet TAKE ONE TABLET BY MOUTH DAILY AT 6 IN THE EVENING 90 tablet 3   Turmeric 500 MG CAPS Take 1 capsule by mouth daily.     Zinc 100 MG TABS Take 100 mg by mouth daily.     apixaban (ELIQUIS) 5 MG TABS tablet Take 1 tablet (5 mg total) by mouth 2 (two) times daily. 180 tablet 3   No current facility-administered medications for  this visit.    Allergies:   Patient has no known allergies.   Social History:  The patient  reports that he has never smoked. He has never used smokeless tobacco. He reports current alcohol use of about 6.0 standard drinks of alcohol per week. He reports that he does not use drugs.   Family History:  The patient's family history includes Atrial fibrillation in his brother; Heart attack (age of onset: 95) in his father; Stroke (age of onset: 63) in his mother.   ROS:  Please see the history of present illness.   Otherwise, review of systems is positive for none.   All other systems  are reviewed and negative.   PHYSICAL EXAM: VS:  BP 138/80   Pulse 78   Ht '6\' 6"'$  (1.981 m)   Wt 259 lb (117.5 kg)   SpO2 97%   BMI 29.93 kg/m  , BMI Body mass index is 29.93 kg/m. GEN: Well nourished, well developed, in no acute distress  HEENT: normal  Neck: no JVD, carotid bruits, or masses Cardiac: RRR; no murmurs, rubs, or gallops,no edema  Respiratory:  clear to auscultation bilaterally, normal work of breathing GI: soft, nontender, nondistended, + BS MS: no deformity or atrophy  Skin: warm and dry Neuro:  Strength and sensation are intact Psych: euthymic mood, full affect  EKG:  EKG is ordered today. Personal review of the ekg ordered shows sinus rhythm, right bundle branch block, rate 78  Recent Labs: 09/16/2021: BUN 21; Creatinine, Ser 0.96; Hemoglobin 13.2; Platelets 201; Potassium 4.4; Sodium 140    Lipid Panel     Component Value Date/Time   CHOL 112 04/14/2020 0733   TRIG 63 04/14/2020 0733   HDL 50 04/14/2020 0733   CHOLHDL 2.2 04/14/2020 0733   CHOLHDL 3.6 04/19/2019 1819   VLDL 33 04/19/2019 1819   LDLCALC 48 04/14/2020 0733     Wt Readings from Last 3 Encounters:  01/06/22 259 lb (117.5 kg)  10/27/21 250 lb 3.2 oz (113.5 kg)  09/29/21 230 lb (104.3 kg)      Other studies Reviewed: Additional studies/ records that were reviewed today include: TTE 04/14/20  Review of the above records today demonstrates:   1. Left ventricular ejection fraction, by estimation, is 55 to 60%. Left  ventricular ejection fraction by PLAX is 59 %. The left ventricle has  normal function. The left ventricle has no regional wall motion  abnormalities. There is mild concentric left  ventricular hypertrophy. Left ventricular diastolic parameters were  normal.   2. Right ventricular systolic function is normal. The right ventricular  size is normal.   3. Left atrial size was mildly dilated.   4. The mitral valve is normal in structure. Trivial mitral valve  regurgitation.  No evidence of mitral stenosis.   5. The aortic valve is tricuspid. Aortic valve regurgitation is not  visualized. No aortic stenosis is present.   6. Aortic dilatation noted. There is mild dilatation of the aortic root,  measuring 42 mm. There is mild dilatation of the ascending aorta,  measuring 41 mm.   7. The inferior vena cava is normal in size with greater than 50%  respiratory variability, suggesting right atrial pressure of 3 mmHg.   Cardiac monitor 05/19/2021 personally reviewed persistent atrial fibrillation throughout recording. Rare PVC's and ventricular runs up to 5 beats.   ASSESSMENT AND PLAN:  1.  Persistent atrial fibrillation: Currently on Eliquis 5 mg twice daily.  CHA2DS2-VASc of 3.  Status post  ablation 09/29/2021.  In sinus rhythm.  Laylani Pudwill continue with current management.  2.  Coronary artery disease: Status post LAD stent.  No current chest pain.  Plan per primary cardiology.  3.  Hypertension: well controlled  4.  Secondary hypercoagulable state: Currently on Eliquis for atrial fibrillation as above.   Current medicines are reviewed at length with the patient today.   The patient does not have concerns regarding his medicines.  The following changes were made today: None  Labs/ tests ordered today include:  Orders Placed This Encounter  Procedures   EKG 12-Lead     Disposition:   FU 6 months  Signed, Aleathia Purdy [name], MD  01/06/2022 3:13 PM     [address] [place] [address] [phone] (office) [phone] (fax)

## 2022-01-06 NOTE — Patient Instructions (Signed)
Medication Instructions:  Your physician recommends that you continue on your current medications as directed. Please refer to the Current Medication list given to you today.  *If you need a refill on your cardiac medications before your next appointment, please call your pharmacy*   Lab Work: None ordered   Testing/Procedures: None ordered   Follow-Up: At [facility], you and your health needs are our priority.  As part of our continuing mission to provide you with exceptional heart care, we have created designated Provider Care Teams.  These Care Teams include your primary Cardiologist (physician) and Advanced Practice Providers (APPs -  Physician Assistants and Nurse Practitioners) who all work together to provide you with the care you need, when you need it.  Your next appointment:   6 month(s)  The format for your next appointment:   In Person  Provider:   Will Camnitz, MD    Thank you for choosing [facility]!!   Esma Kilts, RN [phone]  Other Instructions   Important Information About Sugar           

## 2022-01-12 ENCOUNTER — Other Ambulatory Visit: Payer: Self-pay | Admitting: Physician Assistant

## 2022-01-13 NOTE — Telephone Encounter (Signed)
Prescription refill request for Eliquis received. Indication:afib Last office visit:12/23 Scr:0.9 Age: 71 Weight:117.5  kg  Prescription refilled

## 2022-02-15 ENCOUNTER — Encounter: Payer: Self-pay | Admitting: Cardiovascular Disease

## 2022-02-15 ENCOUNTER — Encounter: Payer: Self-pay | Admitting: Cardiology

## 2022-02-22 ENCOUNTER — Encounter: Payer: Self-pay | Admitting: Cardiovascular Disease

## 2022-02-22 DIAGNOSIS — M47816 Spondylosis without myelopathy or radiculopathy, lumbar region: Secondary | ICD-10-CM | POA: Diagnosis not present

## 2022-02-22 MED ORDER — LOSARTAN POTASSIUM 50 MG PO TABS: 50.0000 mg | ORAL_TABLET | Freq: Every day | ORAL | 2 refills | Status: DC

## 2022-02-22 MED ORDER — ROSUVASTATIN CALCIUM 40 MG PO TABS: ORAL_TABLET | ORAL | 2 refills | Status: DC

## 2022-03-16 DIAGNOSIS — M47816 Spondylosis without myelopathy or radiculopathy, lumbar region: Secondary | ICD-10-CM | POA: Diagnosis not present

## 2022-03-30 DIAGNOSIS — E7800 Pure hypercholesterolemia, unspecified: Secondary | ICD-10-CM | POA: Diagnosis not present

## 2022-03-30 DIAGNOSIS — D6869 Other thrombophilia: Secondary | ICD-10-CM | POA: Diagnosis not present

## 2022-03-30 DIAGNOSIS — I480 Paroxysmal atrial fibrillation: Secondary | ICD-10-CM | POA: Diagnosis not present

## 2022-03-30 DIAGNOSIS — I10 Essential (primary) hypertension: Secondary | ICD-10-CM | POA: Diagnosis not present

## 2022-03-30 DIAGNOSIS — N529 Male erectile dysfunction, unspecified: Secondary | ICD-10-CM | POA: Diagnosis not present

## 2022-03-30 DIAGNOSIS — I2510 Atherosclerotic heart disease of native coronary artery without angina pectoris: Secondary | ICD-10-CM | POA: Diagnosis not present

## 2022-04-21 DIAGNOSIS — M47816 Spondylosis without myelopathy or radiculopathy, lumbar region: Secondary | ICD-10-CM | POA: Diagnosis not present

## 2022-05-02 DIAGNOSIS — H35373 Puckering of macula, bilateral: Secondary | ICD-10-CM | POA: Diagnosis not present

## 2022-05-26 DIAGNOSIS — Z96642 Presence of left artificial hip joint: Secondary | ICD-10-CM | POA: Diagnosis not present

## 2022-05-26 DIAGNOSIS — Z471 Aftercare following joint replacement surgery: Secondary | ICD-10-CM | POA: Diagnosis not present

## 2022-05-30 DIAGNOSIS — M47816 Spondylosis without myelopathy or radiculopathy, lumbar region: Secondary | ICD-10-CM | POA: Diagnosis not present

## 2022-06-09 DIAGNOSIS — M47814 Spondylosis without myelopathy or radiculopathy, thoracic region: Secondary | ICD-10-CM | POA: Diagnosis not present

## 2022-06-09 DIAGNOSIS — M47816 Spondylosis without myelopathy or radiculopathy, lumbar region: Secondary | ICD-10-CM | POA: Diagnosis not present

## 2022-06-09 DIAGNOSIS — M4150 Other secondary scoliosis, site unspecified: Secondary | ICD-10-CM | POA: Diagnosis not present

## 2022-06-09 DIAGNOSIS — M545 Low back pain, unspecified: Secondary | ICD-10-CM | POA: Diagnosis not present

## 2022-06-09 DIAGNOSIS — M5416 Radiculopathy, lumbar region: Secondary | ICD-10-CM | POA: Diagnosis not present

## 2022-06-09 DIAGNOSIS — Z87891 Personal history of nicotine dependence: Secondary | ICD-10-CM | POA: Diagnosis not present

## 2022-06-09 DIAGNOSIS — G8929 Other chronic pain: Secondary | ICD-10-CM | POA: Diagnosis not present

## 2022-06-09 DIAGNOSIS — M47812 Spondylosis without myelopathy or radiculopathy, cervical region: Secondary | ICD-10-CM | POA: Diagnosis not present

## 2022-06-14 ENCOUNTER — Ambulatory Visit: Payer: Medicare Other | Admitting: Dermatology

## 2022-06-22 DIAGNOSIS — H9313 Tinnitus, bilateral: Secondary | ICD-10-CM | POA: Diagnosis not present

## 2022-06-22 DIAGNOSIS — H903 Sensorineural hearing loss, bilateral: Secondary | ICD-10-CM | POA: Diagnosis not present

## 2022-06-26 IMAGING — CR DG ELBOW COMPLETE 3+V*R*
4 series · 4 of 4 positions shown · non-contrast
Comparison: None.

CLINICAL DATA: Right elbow and shoulder pain for 2 years. No known
injury.

EXAM:
RIGHT ELBOW - COMPLETE 3+ VIEW

[x elbow joint ap right]
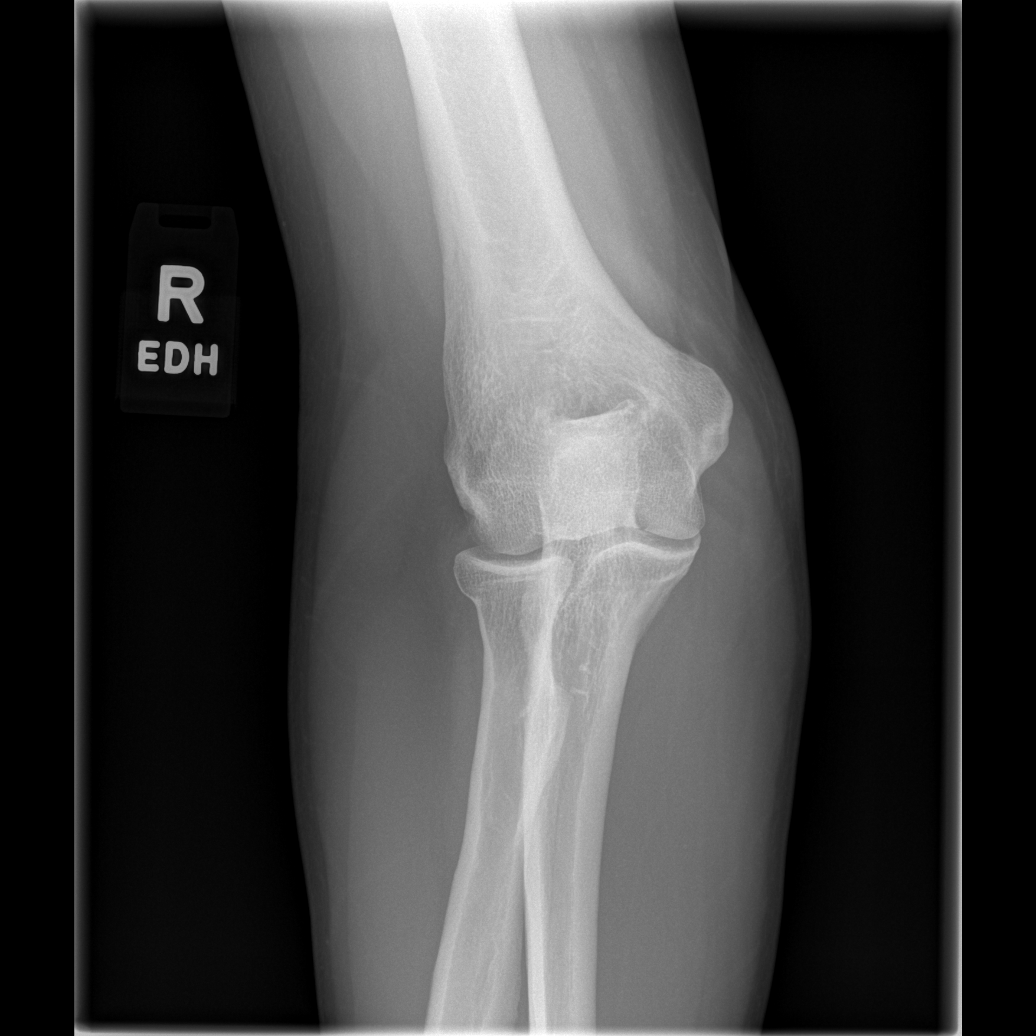

[x elbow joint obl. right (1 of 2)]
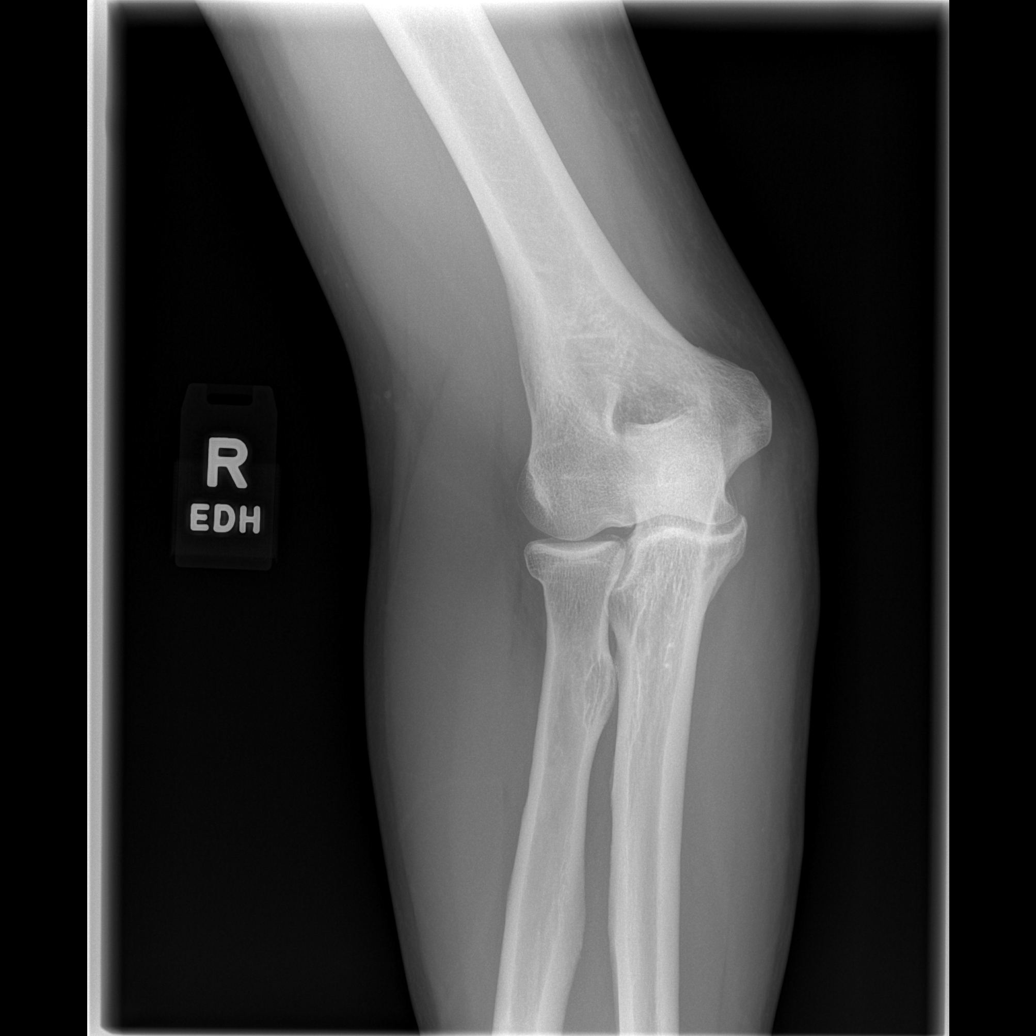

[x elbow joint obl. right (2 of 2)]
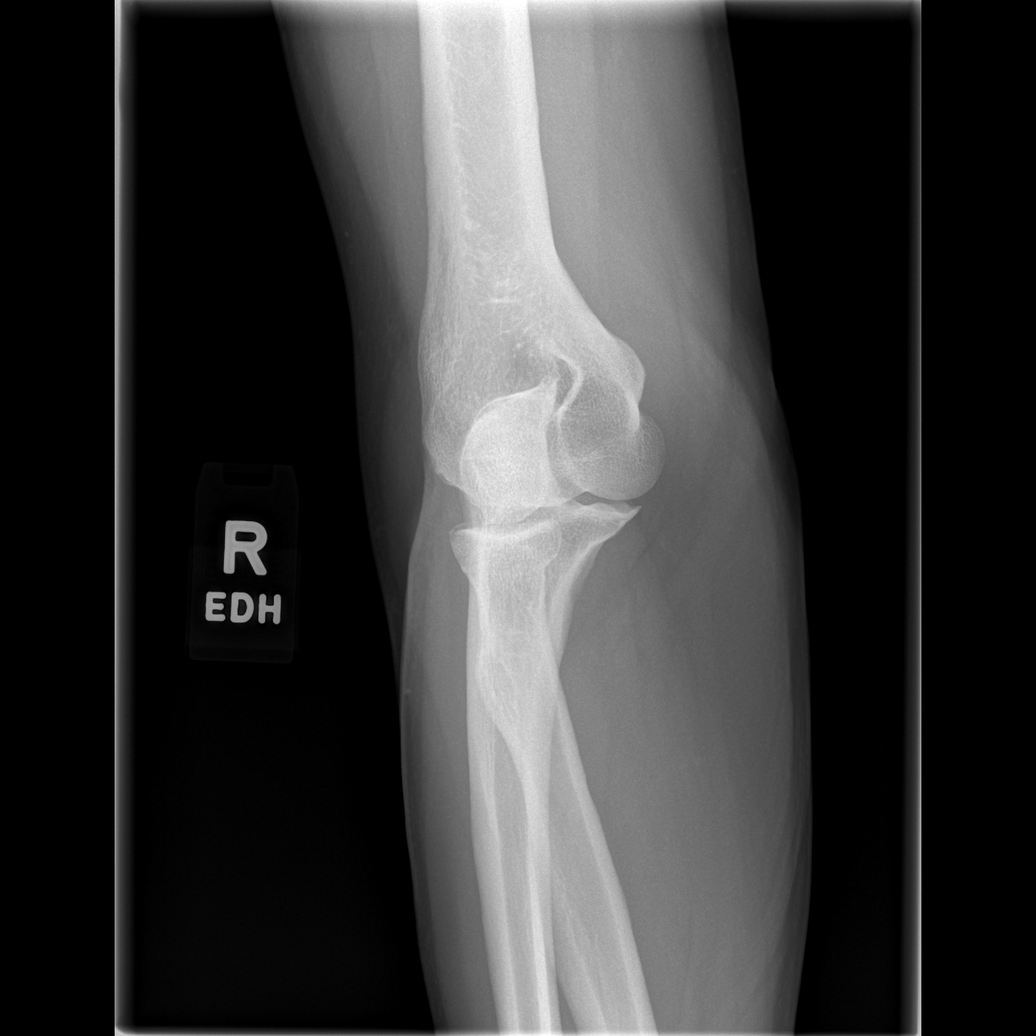

[x elbow joint lat right]
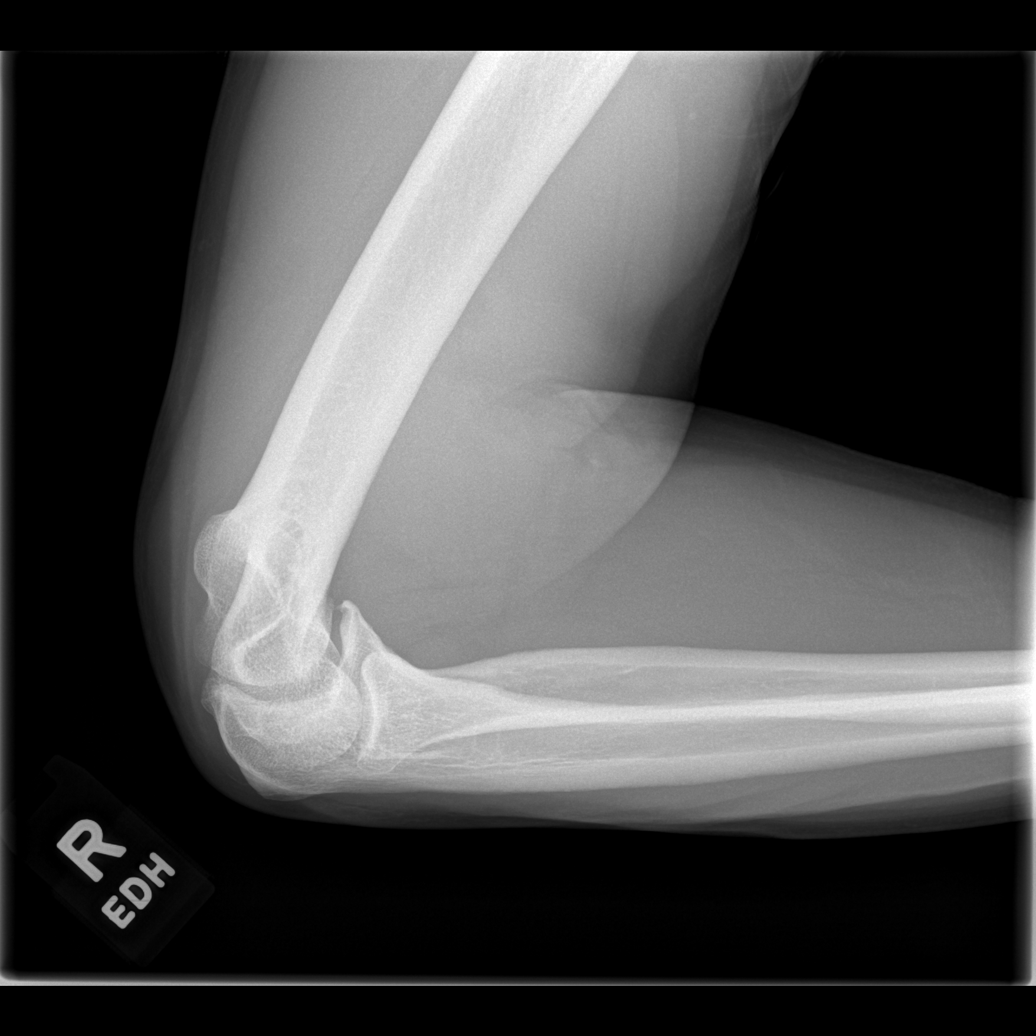

[4 of 4 positions shown; findings below may reference images not displayed]

FINDINGS: Lateral view is limited by positioning. Mild osteoarthritis with
spurring of the coronoid. The joint spaces appear preserved. No
elbow joint effusion. No erosion or periosteal reaction. No evidence
of focal bone lesion. Soft tissues are unremarkable.
IMPRESSION: Mild osteoarthritis with spurring of the coronoid.

## 2022-06-28 ENCOUNTER — Encounter: Payer: Self-pay | Admitting: Cardiology

## 2022-06-28 ENCOUNTER — Ambulatory Visit: Payer: Medicare Other | Attending: Cardiology | Admitting: Cardiology

## 2022-06-28 VITALS — BP 132/84 | HR 89 | Ht 78.0 in | Wt 252.2 lb

## 2022-06-28 DIAGNOSIS — I4819 Other persistent atrial fibrillation: Secondary | ICD-10-CM | POA: Diagnosis not present

## 2022-06-28 DIAGNOSIS — I10 Essential (primary) hypertension: Secondary | ICD-10-CM | POA: Diagnosis not present

## 2022-06-28 DIAGNOSIS — I2510 Atherosclerotic heart disease of native coronary artery without angina pectoris: Secondary | ICD-10-CM

## 2022-06-28 DIAGNOSIS — D6869 Other thrombophilia: Secondary | ICD-10-CM

## 2022-06-28 DIAGNOSIS — Z79899 Other long term (current) drug therapy: Secondary | ICD-10-CM

## 2022-06-28 NOTE — Progress Notes (Signed)
Electrophysiology Office Note   Date:  06/28/2022   ID:  Paul Acosta, DOB [birth year], MRN [id number]  PCP:  Elias Else, MD (Inactive)  Cardiologist:  Excell Seltzer Primary Electrophysiologist:  Joyce Heitman Jorja Loa, MD    Chief Complaint: AF   History of Present Illness: Paul Acosta is a 72 y.o. male who is being seen today for the evaluation of AF at the request of Elias Else, MD. Presenting today for electrophysiology evaluation.  He has a history seen for coronary artery disease post LAD stent, dilated aortic root, hypertension, hyperlipidemia, atrial fibrillation.  He is post ablation 09/29/2021.  Today, denies symptoms of palpitations, chest pain, shortness of breath, orthopnea, PND, lower extremity edema, claudication, dizziness, presyncope, syncope, bleeding, or neurologic sequela. The patient is tolerating medications without difficulties.  He is currently feeling well.  He has no chest pain or shortness of breath.  He is able to do all of his daily activities.  He has plans for back surgery later this summer at [facility].  He is able to go to the gym without issue.  He has no pain or shortness of breath when he exerts himself.  He is limited by back pain but nothing other than that.   Past Medical History:  Diagnosis Date   Basal cell carcinoma 01/28/2015   R post auricular neck - ED&C    Dysplastic nevus 07/27/2009   R med pectoral - mild    Hypertension    Medical history non-contributory    Past Surgical History:  Procedure Laterality Date   ATRIAL FIBRILLATION ABLATION N/A 09/29/2021   Procedure: ATRIAL FIBRILLATION ABLATION;  Surgeon: Regan Lemming, MD;  Location: [facility];  Service: Cardiovascular;  Laterality: N/A;   EYE SURGERY     LEFT HEART CATH AND CORONARY ANGIOGRAPHY N/A 04/21/2019   Procedure: LEFT HEART CATH AND CORONARY ANGIOGRAPHY;  Surgeon: Lennette Bihari, MD;  Location: [facility];  Service: Cardiovascular;  Laterality: N/A;    TOTAL HIP ARTHROPLASTY Right 11/07/2013   Procedure: RIGHT TOTAL HIP ARTHROPLASTY;  Surgeon: Nestor Lewandowsky, MD;  Location: [facility];  Service: Orthopedics;  Laterality: Right;     Current Outpatient Medications  Medication Sig Dispense Refill   acetaminophen (TYLENOL) 650 MG CR tablet Take 1,300 mg by mouth every 8 (eight) hours as needed for pain.     amLODipine (NORVASC) 5 MG tablet TAKE ONE TABLET BY MOUTH DAILY 90 tablet 2   ascorbic acid (VITAMIN C) 1000 MG tablet Take 1,000 mg by mouth daily.     Cholecalciferol (VITAMIN D3 GUMMIES PO) Take 5,000 Units by mouth daily.     ELIQUIS 5 MG TABS tablet TAKE ONE TABLET BY MOUTH TWO TIMES A DAY 180 tablet 3   fluorouracil (EFUDEX) 5 % cream Apply topically 2 (two) times daily. X 1 week to scalp 15 g 0   fluticasone (FLONASE) 50 MCG/ACT nasal spray Place 1 spray into both nostrils daily as needed for allergies or rhinitis.     ketoconazole (NIZORAL) 2 % cream Apply to affected areas at scalp three times weekly at bedtime. 30 g 6   losartan (COZAAR) 50 MG tablet Take 1 tablet (50 mg total) by mouth daily. 90 tablet 2   nitroGLYCERIN (NITROSTAT) 0.4 MG SL tablet Place 1 tablet (0.4 mg total) under the tongue every 5 (five) minutes as needed for chest pain. 30 tablet 0   rosuvastatin (CRESTOR) 40 MG tablet TAKE ONE TABLET BY MOUTH DAILY  AT 6 IN THE EVENING 90 tablet 2   Turmeric 500 MG CAPS Take 1 capsule by mouth daily.     Zinc 100 MG TABS Take 100 mg by mouth daily.     No current facility-administered medications for this visit.    Allergies:   Patient has no known allergies.   Social History:  The patient  reports that he has never smoked. He has never used smokeless tobacco. He reports current alcohol use of about 6.0 standard drinks of alcohol per week. He reports that he does not use drugs.   Family History:  The patient's family history includes Atrial fibrillation in his brother; Heart attack (age of onset: 60) in his father; Stroke (age  of onset: 80) in his mother.   ROS:  Please see the history of present illness.   Otherwise, review of systems is positive for none.   All other systems are reviewed and negative.   PHYSICAL EXAM: VS:  BP 132/84   Pulse 89   Ht 6\' 6"  (1.981 m)   Wt 252 lb 3.2 oz (114.4 kg)   SpO2 98%   BMI 29.14 kg/m  , BMI Body mass index is 29.14 kg/m. GEN: Well nourished, well developed, in no acute distress  HEENT: normal  Neck: no JVD, carotid bruits, or masses Cardiac: RRR; no murmurs, rubs, or gallops,no edema  Respiratory:  clear to auscultation bilaterally, normal work of breathing GI: soft, nontender, nondistended, + BS MS: no deformity or atrophy  Skin: warm and dry Neuro:  Strength and sensation are intact Psych: euthymic mood, full affect  EKG:  EKG is ordered today. Personal review of the ekg ordered shows sinus rhythm, RBBB   Recent Labs: 09/16/2021: BUN 21; Creatinine, Ser 0.96; Hemoglobin 13.2; Platelets 201; Potassium 4.4; Sodium 140    Lipid Panel     Component Value Date/Time   CHOL 112 04/14/2020 0733   TRIG 63 04/14/2020 0733   HDL 50 04/14/2020 0733   CHOLHDL 2.2 04/14/2020 0733   CHOLHDL 3.6 04/19/2019 1819   VLDL 33 04/19/2019 1819   LDLCALC 48 04/14/2020 0733     Wt Readings from Last 3 Encounters:  06/28/22 252 lb 3.2 oz (114.4 kg)  01/06/22 259 lb (117.5 kg)  10/27/21 250 lb 3.2 oz (113.5 kg)      Other studies Reviewed: Additional studies/ records that were reviewed today include: TTE 04/14/20  Review of the above records today demonstrates:   1. Left ventricular ejection fraction, by estimation, is 55 to 60%. Left  ventricular ejection fraction by PLAX is 59 %. The left ventricle has  normal function. The left ventricle has no regional wall motion  abnormalities. There is mild concentric left  ventricular hypertrophy. Left ventricular diastolic parameters were  normal.   2. Right ventricular systolic function is normal. The right ventricular   size is normal.   3. Left atrial size was mildly dilated.   4. The mitral valve is normal in structure. Trivial mitral valve  regurgitation. No evidence of mitral stenosis.   5. The aortic valve is tricuspid. Aortic valve regurgitation is not  visualized. No aortic stenosis is present.   6. Aortic dilatation noted. There is mild dilatation of the aortic root,  measuring 42 mm. There is mild dilatation of the ascending aorta,  measuring 41 mm.   7. The inferior vena cava is normal in size with greater than 50%  respiratory variability, suggesting right atrial pressure of 3 mmHg.   Cardiac  monitor 05/19/2021 personally reviewed persistent atrial fibrillation throughout recording. Rare PVC's and ventricular runs up to 5 beats.   ASSESSMENT AND PLAN:  1.  Persistent atrial fibrillation: Currently on Eliquis.  CHA2DS2-VASc of 3.  Post ablation 09/29/2021.  Remained in sinus rhythm.  He has had no further episodes of atrial fibrillation.  2.  Coronary artery disease: Status post LAD stent.  No current chest pain.  Plan per primary cardiology.  3.  Hypertension: Currently well-controlled  4.  Secondary hypercoagulable state: Currently on Eliquis for atrial fibrillation  5.  Preoperative evaluation: Has plans for back surgery in September.  He is able to exercise without issue.  He goes to the gym multiple times a week and has no chest pain or shortness of breath.  He would be at intermediate risk for this intermediate risk procedure.  No further cardiac testing is necessary.  He would be able to stop his anticoagulation prior to surgery.  Current medicines are reviewed at length with the patient today.   The patient does not have concerns regarding his medicines.  The following changes were made today: none  Labs/ tests ordered today include:  Orders Placed This Encounter  Procedures   Basic metabolic panel   CBC   EKG 12-Lead     Disposition:   FU 6 months  Signed, Akili Cuda Jorja Loa, MD  06/28/2022 8:50 AM     [facility] [address] [place] [address] [phone] (office) [phone] (fax)

## 2022-06-28 NOTE — Patient Instructions (Addendum)
Medication Instructions:  Your physician recommends that you continue on your current medications as directed. Please refer to the Current Medication list given to you today.  *If you need a refill on your cardiac medications before your next appointment, please call your pharmacy*   Lab Work: Eliquis surveillance labs:  BMET & CBC  If you have labs (blood work) drawn today and your tests are completely normal, you will receive your results only by: MyChart Message (if you have MyChart) OR A paper copy in the mail If you have any lab test that is abnormal or we need to change your treatment, we will call you to review the results.   Testing/Procedures: None ordered   Follow-Up: At [facility], you and your health needs are our priority.  As part of our continuing mission to provide you with exceptional heart care, we have created designated Provider Care Teams.  These Care Teams include your primary Cardiologist (physician) and Advanced Practice Providers (APPs -  Physician Assistants and Nurse Practitioners) who all work together to provide you with the care you need, when you need it.  Your next appointment:   6 month(s)  The format for your next appointment:   In Person  Provider:   Loman Brooklyn, MD    Thank you for choosing [facility]!!   Dory Horn, RN [phone]

## 2022-06-29 DIAGNOSIS — M9933 Osseous stenosis of neural canal of lumbar region: Secondary | ICD-10-CM | POA: Diagnosis not present

## 2022-06-29 DIAGNOSIS — M4186 Other forms of scoliosis, lumbar region: Secondary | ICD-10-CM | POA: Diagnosis not present

## 2022-06-29 DIAGNOSIS — M48061 Spinal stenosis, lumbar region without neurogenic claudication: Secondary | ICD-10-CM | POA: Diagnosis not present

## 2022-06-29 DIAGNOSIS — M4807 Spinal stenosis, lumbosacral region: Secondary | ICD-10-CM | POA: Diagnosis not present

## 2022-06-29 DIAGNOSIS — M47816 Spondylosis without myelopathy or radiculopathy, lumbar region: Secondary | ICD-10-CM | POA: Diagnosis not present

## 2022-06-29 LAB — CBC
Hematocrit: 43.2 % (ref 37.5–51.0)
Hemoglobin: 14.6 g/dL (ref 13.0–17.7)
MCH: 30.2 pg (ref 26.6–33.0)
MCHC: 33.8 g/dL (ref 31.5–35.7)
MCV: 89 fL (ref 79–97)
Platelets: 226 10*3/uL (ref 150–450)
RBC: 4.84 x10E6/uL (ref 4.14–5.80)
RDW: 13.4 % (ref 11.6–15.4)
WBC: 4.1 10*3/uL (ref 3.4–10.8)

## 2022-06-29 LAB — BASIC METABOLIC PANEL
BUN/Creatinine Ratio: 23 (ref 10–24)
BUN: 19 mg/dL (ref 8–27)
CO2: 23 mmol/L (ref 20–29)
Calcium: 9.5 mg/dL (ref 8.6–10.2)
Chloride: 104 mmol/L (ref 96–106)
Creatinine, Ser: 0.84 mg/dL (ref 0.76–1.27)
Glucose: 95 mg/dL (ref 70–99)
Potassium: 4.4 mmol/L (ref 3.5–5.2)
Sodium: 141 mmol/L (ref 134–144)
eGFR: 94 mL/min/{1.73_m2} (ref >59)

## 2022-07-31 DIAGNOSIS — H9313 Tinnitus, bilateral: Secondary | ICD-10-CM | POA: Diagnosis not present

## 2022-07-31 DIAGNOSIS — J342 Deviated nasal septum: Secondary | ICD-10-CM | POA: Diagnosis not present

## 2022-07-31 DIAGNOSIS — J3489 Other specified disorders of nose and nasal sinuses: Secondary | ICD-10-CM | POA: Diagnosis not present

## 2022-09-08 ENCOUNTER — Telehealth: Payer: Self-pay | Admitting: Cardiovascular Disease

## 2022-09-08 DIAGNOSIS — Z955 Presence of coronary angioplasty implant and graft: Secondary | ICD-10-CM | POA: Diagnosis not present

## 2022-09-08 DIAGNOSIS — M48061 Spinal stenosis, lumbar region without neurogenic claudication: Secondary | ICD-10-CM | POA: Diagnosis not present

## 2022-09-08 DIAGNOSIS — I10 Essential (primary) hypertension: Secondary | ICD-10-CM | POA: Diagnosis not present

## 2022-09-08 DIAGNOSIS — I214 Non-ST elevation (NSTEMI) myocardial infarction: Secondary | ICD-10-CM | POA: Diagnosis not present

## 2022-09-08 DIAGNOSIS — I480 Paroxysmal atrial fibrillation: Secondary | ICD-10-CM | POA: Diagnosis not present

## 2022-09-08 DIAGNOSIS — Z01818 Encounter for other preprocedural examination: Secondary | ICD-10-CM | POA: Diagnosis not present

## 2022-09-08 DIAGNOSIS — I2510 Atherosclerotic heart disease of native coronary artery without angina pectoris: Secondary | ICD-10-CM | POA: Diagnosis not present

## 2022-09-08 NOTE — Telephone Encounter (Signed)
Copy of most recent EKG from 06/2022 faxed to [facility] as requested

## 2022-09-08 NOTE — Telephone Encounter (Signed)
Office calling to get the EKG tracing. Please advise

## 2022-09-22 DIAGNOSIS — I10 Essential (primary) hypertension: Secondary | ICD-10-CM | POA: Diagnosis not present

## 2022-09-22 DIAGNOSIS — M5417 Radiculopathy, lumbosacral region: Secondary | ICD-10-CM | POA: Diagnosis not present

## 2022-09-22 DIAGNOSIS — I4891 Unspecified atrial fibrillation: Secondary | ICD-10-CM | POA: Diagnosis not present

## 2022-09-22 DIAGNOSIS — I480 Paroxysmal atrial fibrillation: Secondary | ICD-10-CM | POA: Diagnosis not present

## 2022-09-22 DIAGNOSIS — E785 Hyperlipidemia, unspecified: Secondary | ICD-10-CM | POA: Diagnosis not present

## 2022-09-22 DIAGNOSIS — M5416 Radiculopathy, lumbar region: Secondary | ICD-10-CM | POA: Diagnosis not present

## 2022-09-22 DIAGNOSIS — Z96643 Presence of artificial hip joint, bilateral: Secondary | ICD-10-CM | POA: Diagnosis not present

## 2022-09-22 DIAGNOSIS — Z96661 Presence of right artificial ankle joint: Secondary | ICD-10-CM | POA: Diagnosis not present

## 2022-09-22 DIAGNOSIS — Z85828 Personal history of other malignant neoplasm of skin: Secondary | ICD-10-CM | POA: Diagnosis not present

## 2022-09-22 DIAGNOSIS — I4510 Unspecified right bundle-branch block: Secondary | ICD-10-CM | POA: Diagnosis not present

## 2022-09-22 DIAGNOSIS — M48061 Spinal stenosis, lumbar region without neurogenic claudication: Secondary | ICD-10-CM | POA: Diagnosis not present

## 2022-09-22 DIAGNOSIS — Z955 Presence of coronary angioplasty implant and graft: Secondary | ICD-10-CM | POA: Diagnosis not present

## 2022-09-22 DIAGNOSIS — Z8673 Personal history of transient ischemic attack (TIA), and cerebral infarction without residual deficits: Secondary | ICD-10-CM | POA: Diagnosis not present

## 2022-09-22 DIAGNOSIS — I2510 Atherosclerotic heart disease of native coronary artery without angina pectoris: Secondary | ICD-10-CM | POA: Diagnosis not present

## 2022-09-22 DIAGNOSIS — M4807 Spinal stenosis, lumbosacral region: Secondary | ICD-10-CM | POA: Diagnosis not present

## 2022-09-22 DIAGNOSIS — E7800 Pure hypercholesterolemia, unspecified: Secondary | ICD-10-CM | POA: Diagnosis not present

## 2022-09-22 DIAGNOSIS — Z79899 Other long term (current) drug therapy: Secondary | ICD-10-CM | POA: Diagnosis not present

## 2022-09-22 DIAGNOSIS — M4156 Other secondary scoliosis, lumbar region: Secondary | ICD-10-CM | POA: Diagnosis not present

## 2022-09-22 DIAGNOSIS — Z87891 Personal history of nicotine dependence: Secondary | ICD-10-CM | POA: Diagnosis not present

## 2022-09-22 DIAGNOSIS — Z7901 Long term (current) use of anticoagulants: Secondary | ICD-10-CM | POA: Diagnosis not present

## 2022-09-22 DIAGNOSIS — I252 Old myocardial infarction: Secondary | ICD-10-CM | POA: Diagnosis not present

## 2022-09-26 DIAGNOSIS — I4891 Unspecified atrial fibrillation: Secondary | ICD-10-CM | POA: Diagnosis not present

## 2022-09-27 ENCOUNTER — Encounter: Payer: Self-pay | Admitting: Cardiology

## 2022-09-27 DIAGNOSIS — I2510 Atherosclerotic heart disease of native coronary artery without angina pectoris: Secondary | ICD-10-CM | POA: Diagnosis not present

## 2022-09-27 DIAGNOSIS — E785 Hyperlipidemia, unspecified: Secondary | ICD-10-CM | POA: Diagnosis not present

## 2022-09-27 DIAGNOSIS — I252 Old myocardial infarction: Secondary | ICD-10-CM | POA: Diagnosis not present

## 2022-09-27 DIAGNOSIS — M48061 Spinal stenosis, lumbar region without neurogenic claudication: Secondary | ICD-10-CM | POA: Diagnosis not present

## 2022-09-27 DIAGNOSIS — I10 Essential (primary) hypertension: Secondary | ICD-10-CM | POA: Diagnosis not present

## 2022-09-27 DIAGNOSIS — Z7901 Long term (current) use of anticoagulants: Secondary | ICD-10-CM | POA: Diagnosis not present

## 2022-09-27 DIAGNOSIS — Z4789 Encounter for other orthopedic aftercare: Secondary | ICD-10-CM | POA: Diagnosis not present

## 2022-09-27 DIAGNOSIS — Z85828 Personal history of other malignant neoplasm of skin: Secondary | ICD-10-CM | POA: Diagnosis not present

## 2022-09-27 DIAGNOSIS — Z981 Arthrodesis status: Secondary | ICD-10-CM | POA: Diagnosis not present

## 2022-09-27 DIAGNOSIS — M1611 Unilateral primary osteoarthritis, right hip: Secondary | ICD-10-CM | POA: Diagnosis not present

## 2022-09-27 DIAGNOSIS — I4819 Other persistent atrial fibrillation: Secondary | ICD-10-CM | POA: Diagnosis not present

## 2022-10-02 DIAGNOSIS — I10 Essential (primary) hypertension: Secondary | ICD-10-CM | POA: Diagnosis not present

## 2022-10-02 DIAGNOSIS — I2510 Atherosclerotic heart disease of native coronary artery without angina pectoris: Secondary | ICD-10-CM | POA: Diagnosis not present

## 2022-10-02 DIAGNOSIS — I4819 Other persistent atrial fibrillation: Secondary | ICD-10-CM | POA: Diagnosis not present

## 2022-10-02 DIAGNOSIS — I252 Old myocardial infarction: Secondary | ICD-10-CM | POA: Diagnosis not present

## 2022-10-02 DIAGNOSIS — Z4789 Encounter for other orthopedic aftercare: Secondary | ICD-10-CM | POA: Diagnosis not present

## 2022-10-02 DIAGNOSIS — M48061 Spinal stenosis, lumbar region without neurogenic claudication: Secondary | ICD-10-CM | POA: Diagnosis not present

## 2022-10-04 DIAGNOSIS — Z4889 Encounter for other specified surgical aftercare: Secondary | ICD-10-CM | POA: Diagnosis not present

## 2022-10-09 DIAGNOSIS — I2510 Atherosclerotic heart disease of native coronary artery without angina pectoris: Secondary | ICD-10-CM | POA: Diagnosis not present

## 2022-10-09 DIAGNOSIS — Z4789 Encounter for other orthopedic aftercare: Secondary | ICD-10-CM | POA: Diagnosis not present

## 2022-10-09 DIAGNOSIS — I4819 Other persistent atrial fibrillation: Secondary | ICD-10-CM | POA: Diagnosis not present

## 2022-10-09 DIAGNOSIS — I10 Essential (primary) hypertension: Secondary | ICD-10-CM | POA: Diagnosis not present

## 2022-10-09 DIAGNOSIS — M48061 Spinal stenosis, lumbar region without neurogenic claudication: Secondary | ICD-10-CM | POA: Diagnosis not present

## 2022-10-09 DIAGNOSIS — I252 Old myocardial infarction: Secondary | ICD-10-CM | POA: Diagnosis not present

## 2022-10-11 DIAGNOSIS — I10 Essential (primary) hypertension: Secondary | ICD-10-CM | POA: Diagnosis not present

## 2022-10-11 DIAGNOSIS — I4819 Other persistent atrial fibrillation: Secondary | ICD-10-CM | POA: Diagnosis not present

## 2022-10-11 DIAGNOSIS — M48061 Spinal stenosis, lumbar region without neurogenic claudication: Secondary | ICD-10-CM | POA: Diagnosis not present

## 2022-10-11 DIAGNOSIS — I252 Old myocardial infarction: Secondary | ICD-10-CM | POA: Diagnosis not present

## 2022-10-11 DIAGNOSIS — I2510 Atherosclerotic heart disease of native coronary artery without angina pectoris: Secondary | ICD-10-CM | POA: Diagnosis not present

## 2022-10-11 DIAGNOSIS — Z4789 Encounter for other orthopedic aftercare: Secondary | ICD-10-CM | POA: Diagnosis not present

## 2022-10-13 ENCOUNTER — Ambulatory Visit (HOSPITAL_COMMUNITY)
Admission: RE | Admit: 2022-10-13 | Discharge: 2022-10-13 | Disposition: A | Discharge status: Home / Self Care | Payer: Medicare Other | Source: Ambulatory Visit | Attending: Physician Assistant | Admitting: Physician Assistant

## 2022-10-13 ENCOUNTER — Encounter (HOSPITAL_COMMUNITY): Payer: Self-pay | Admitting: Physician Assistant

## 2022-10-13 VITALS — BP 116/68 | HR 77 | Ht 78.0 in | Wt 246.0 lb

## 2022-10-13 DIAGNOSIS — I10 Essential (primary) hypertension: Secondary | ICD-10-CM | POA: Diagnosis not present

## 2022-10-13 DIAGNOSIS — I2510 Atherosclerotic heart disease of native coronary artery without angina pectoris: Secondary | ICD-10-CM | POA: Diagnosis not present

## 2022-10-13 DIAGNOSIS — I4819 Other persistent atrial fibrillation: Secondary | ICD-10-CM | POA: Diagnosis not present

## 2022-10-13 DIAGNOSIS — D6869 Other thrombophilia: Secondary | ICD-10-CM | POA: Diagnosis not present

## 2022-10-13 DIAGNOSIS — Z7901 Long term (current) use of anticoagulants: Secondary | ICD-10-CM | POA: Diagnosis not present

## 2022-10-13 DIAGNOSIS — I4891 Unspecified atrial fibrillation: Secondary | ICD-10-CM | POA: Diagnosis present

## 2022-10-13 NOTE — Progress Notes (Signed)
Primary Care Physician: Elias Else, MD (Inactive) Primary Cardiologist: Dr Excell Seltzer Primary Electrophysiologist: Dr Elberta Fortis  Referring Physician: Tereso Newcomer PA   Paul Acosta is a 72 y.o. male with a history of CAD, HTN, HLD, atrial fibrillation who presents for follow up in the [facility]. The patient was initially diagnosed with atrial fibrillation 12/21/20 after [facility] with his smart watch alerting him that he was in afib. He was not symptomatic with his arrhythmia. He was on prednisone at the time. Patient was started on Eliquis for a CHADS2VASC score of 3.  He denies significant snoring and only drinks alcohol occasionally. He had recurrent persistent afib noted on a cardiac monitor and underwent afib ablation with Dr Elberta Fortis on 09/29/21.  On follow up today, patient underwent back surgery at [facility] and went into afib with RVR in recovery. He was started on metoprolol and given a 30 day event monitor. He was asymptomatic at the time. He is in SR today and feeling well. No bleeding issues on anticoagulation.   Today, he denies symptoms of palpitations, chest pain, shortness of breath, orthopnea, PND, lower extremity edema, dizziness, presyncope, syncope, snoring, daytime somnolence, bleeding, or neurologic sequela. The patient is tolerating medications without difficulties and is otherwise without complaint today.    Atrial Fibrillation Risk Factors:  he does not have symptoms or diagnosis of sleep apnea. he does not have a history of rheumatic fever. he does have a history of alcohol use. The patient does have a history of early familial atrial fibrillation or other arrhythmias. Brother has afib.   Atrial Fibrillation Management history:  Previous antiarrhythmic drugs: none Previous cardioversions: none Previous ablations: 09/29/21 Anticoagulation history: Eliquis   Past Medical History:  Diagnosis Date   Basal cell carcinoma  01/28/2015   R post auricular neck - ED&C    Dysplastic nevus 07/27/2009   R med pectoral - mild    Hypertension    Medical history non-contributory     Current Outpatient Medications  Medication Sig Dispense Refill   acetaminophen (TYLENOL) 650 MG CR tablet Take 1,300 mg by mouth every 8 (eight) hours as needed for pain.     amLODipine (NORVASC) 5 MG tablet TAKE ONE TABLET BY MOUTH DAILY 90 tablet 2   ascorbic acid (VITAMIN C) 1000 MG tablet Take 1,000 mg by mouth daily.     Cholecalciferol (VITAMIN D3 GUMMIES PO) Take 5,000 Units by mouth daily.     ELIQUIS 5 MG TABS tablet TAKE ONE TABLET BY MOUTH TWO TIMES A DAY 180 tablet 3   fluorouracil (EFUDEX) 5 % cream Apply topically 2 (two) times daily. X 1 week to scalp 15 g 0   fluticasone (FLONASE) 50 MCG/ACT nasal spray Place 1 spray into both nostrils daily as needed for allergies or rhinitis.     ketoconazole (NIZORAL) 2 % cream Apply to affected areas at scalp three times weekly at bedtime. 30 g 6   losartan (COZAAR) 50 MG tablet Take 1 tablet (50 mg total) by mouth daily. 90 tablet 2   metoprolol succinate (TOPROL-XL) 50 MG 24 hr tablet Take 50 mg by mouth 2 (two) times daily.     nitroGLYCERIN (NITROSTAT) 0.4 MG SL tablet Place 1 tablet (0.4 mg total) under the tongue every 5 (five) minutes as needed for chest pain. 30 tablet 0   rosuvastatin (CRESTOR) 40 MG tablet TAKE ONE TABLET BY MOUTH DAILY AT 6 IN THE EVENING 90 tablet 2  Turmeric 500 MG CAPS Take 1 capsule by mouth daily.     Zinc 100 MG TABS Take 100 mg by mouth daily.     No current facility-administered medications for this encounter.    ROS- All systems are reviewed and negative except as per the HPI above.  Physical Exam: Vitals:   10/13/22 0935  BP: 116/68  Pulse: 77  Weight: 111.6 kg  Height: 6\' 6"  (1.981 m)    GEN: Well nourished, well developed in no acute distress NECK: No JVD; No carotid bruits CARDIAC: Regular rate and rhythm, no murmurs, rubs,  gallops RESPIRATORY:  Clear to auscultation without rales, wheezing or rhonchi  ABDOMEN: Soft, non-tender, non-distended EXTREMITIES:  No edema; No deformity    Wt Readings from Last 3 Encounters:  10/13/22 111.6 kg  06/28/22 114.4 kg  01/06/22 117.5 kg    EKG today demonstrates  SR, 1st degree AV block, RBBB, LAFB Vent. rate 77 BPM PR interval 220 ms QRS duration 152 ms QT/QTcB 428/484 ms  Echo 04/14/20 demonstrated   1. Left ventricular ejection fraction, by estimation, is 55 to 60%. Left  ventricular ejection fraction by PLAX is 59 %. The left ventricle has  normal function. The left ventricle has no regional wall motion  abnormalities. There is mild concentric left ventricular hypertrophy. Left ventricular diastolic parameters were normal.   2. Right ventricular systolic function is normal. The right ventricular  size is normal.   3. Left atrial size was mildly dilated.   4. The mitral valve is normal in structure. Trivial mitral valve  regurgitation. No evidence of mitral stenosis.   5. The aortic valve is tricuspid. Aortic valve regurgitation is not  visualized. No aortic stenosis is present.   6. Aortic dilatation noted. There is mild dilatation of the aortic root,  measuring 42 mm. There is mild dilatation of the ascending aorta,  measuring 41 mm.   7. The inferior vena cava is normal in size with greater than 50%  respiratory variability, suggesting right atrial pressure of 3 mmHg.   Epic records are reviewed at length today  CHA2DS2-VASc Score = 3  The patient's score is based upon: CHF History: 0 HTN History: 1 Diabetes History: 0 Stroke History: 0 Vascular Disease History: 1 Age Score: 1 Gender Score: 0         ASSESSMENT AND PLAN: Persistent atrial fibrillation  The patient's CHA2DS2-VASc score is 3, indicating a 3.2% annual risk of stroke.   S/p afib ablation 09/29/21 Suspect recent afib was 2/2 surgery. Will see what his monitor shows once it is  completed.  Continue Toprol 50 mg BID for now.  Continue Eliquis 5 mg BID Continue to monitor with smart watch.   Secondary Hypercoagulable State (ICD10:  D68.69) The patient is at significant risk for stroke/thromboembolism based upon his CHA2DS2-VASc Score of 3.  Continue Apixaban (Eliquis).   CAD No anginal symptoms  HTN Stable on current regimen   Follow up with Dr Elberta Fortis as scheduled.    Jorja Loa PA-C [facility] [address] [place], [place] [address] [phone] 10/13/2022 11:11 AM

## 2022-10-17 DIAGNOSIS — Z4789 Encounter for other orthopedic aftercare: Secondary | ICD-10-CM | POA: Diagnosis not present

## 2022-10-17 DIAGNOSIS — I10 Essential (primary) hypertension: Secondary | ICD-10-CM | POA: Diagnosis not present

## 2022-10-17 DIAGNOSIS — I2510 Atherosclerotic heart disease of native coronary artery without angina pectoris: Secondary | ICD-10-CM | POA: Diagnosis not present

## 2022-10-17 DIAGNOSIS — I4819 Other persistent atrial fibrillation: Secondary | ICD-10-CM | POA: Diagnosis not present

## 2022-10-17 DIAGNOSIS — M48061 Spinal stenosis, lumbar region without neurogenic claudication: Secondary | ICD-10-CM | POA: Diagnosis not present

## 2022-10-17 DIAGNOSIS — I252 Old myocardial infarction: Secondary | ICD-10-CM | POA: Diagnosis not present

## 2022-10-23 ENCOUNTER — Ambulatory Visit: Payer: Medicare Other | Attending: Cardiovascular Disease | Admitting: Cardiovascular Disease

## 2022-10-23 ENCOUNTER — Encounter: Payer: Self-pay | Admitting: Cardiovascular Disease

## 2022-10-23 VITALS — BP 110/72 | HR 68 | Ht 79.0 in | Wt 242.2 lb

## 2022-10-23 DIAGNOSIS — I2510 Atherosclerotic heart disease of native coronary artery without angina pectoris: Secondary | ICD-10-CM | POA: Diagnosis not present

## 2022-10-23 DIAGNOSIS — I10 Essential (primary) hypertension: Secondary | ICD-10-CM | POA: Insufficient documentation

## 2022-10-23 DIAGNOSIS — Z4789 Encounter for other orthopedic aftercare: Secondary | ICD-10-CM | POA: Diagnosis not present

## 2022-10-23 DIAGNOSIS — E782 Mixed hyperlipidemia: Secondary | ICD-10-CM | POA: Diagnosis not present

## 2022-10-23 DIAGNOSIS — D6869 Other thrombophilia: Secondary | ICD-10-CM | POA: Insufficient documentation

## 2022-10-23 DIAGNOSIS — I252 Old myocardial infarction: Secondary | ICD-10-CM | POA: Diagnosis not present

## 2022-10-23 DIAGNOSIS — M48061 Spinal stenosis, lumbar region without neurogenic claudication: Secondary | ICD-10-CM | POA: Diagnosis not present

## 2022-10-23 DIAGNOSIS — I4819 Other persistent atrial fibrillation: Secondary | ICD-10-CM | POA: Insufficient documentation

## 2022-10-23 MED ORDER — METOPROLOL SUCCINATE ER 50 MG PO TB24: 50.0000 mg | ORAL_TABLET | Freq: Two times a day (BID) | ORAL | 3 refills | Status: DC

## 2022-10-23 NOTE — Progress Notes (Signed)
Cardiology Office Note:    Date:  10/23/2022   ID:  Paul Acosta, DOB Oct 24, 1950, MRN [id number]  PCP:  Elias Else, MD (Inactive)   [facility] Providers Cardiologist:  Tonny Bollman, MD Electrophysiologist:  Will Jorja Loa, MD     Referring MD: No ref. provider found   Chief Complaint  Patient presents with   Hypertension    History of Present Illness:    Paul Acosta is a 72 y.o. male with a hx of coronary artery disease, hypertension, mixed hyperlipidemia, and persistent atrial fibrillation.  His SV history is outlined below:  Coronary artery disease  NSTEMI 4/21 s/p DES to LAD AS HK w/ preserved EF Echocardiogram 3/22: EF 55-60, mild LVH, no RWMA, LAE Dilated aortic root Echocardiogram 3/22: 42 mm; asc aorta 41 mm Hypertension  Hyperlipidemia  Paroxysmal atrial fibrillation Diagnosed 12/22, spontaneously back in sinus rhythm at follow-up in AF clinic Recurrent AF March 2023 AF ablation 09/2021  He underwent atrial fibrillation ablation in 2023 by Dr. Elberta Fortis.  He had a spinal fusion last month at [facility] and states that it was a long surgery.  Postoperatively he had recurrence of atrial fibrillation and was seen by the cardiology team there.  He converted back to sinus rhythm and has not had any problems since then.  He denies chest pain, chest pressure, shortness of breath, or any further heart palpitations.  He is recovering well from surgery now about 4 weeks out.  He is hoping to be released to get back into his regular physical activities.  Past Medical History:  Diagnosis Date   Basal cell carcinoma 01/28/2015   R post auricular neck - ED&C    Dysplastic nevus 07/27/2009   R med pectoral - mild    Hypertension    Medical history non-contributory     Past Surgical History:  Procedure Laterality Date   ATRIAL FIBRILLATION ABLATION N/A 09/29/2021   Procedure: ATRIAL FIBRILLATION ABLATION;  Surgeon: Regan Lemming, MD;  Location: [facility]  INVASIVE CV LAB;  Service: Cardiovascular;  Laterality: N/A;   EYE SURGERY     LEFT HEART CATH AND CORONARY ANGIOGRAPHY N/A 04/21/2019   Procedure: LEFT HEART CATH AND CORONARY ANGIOGRAPHY;  Surgeon: Lennette Bihari, MD;  Location: [facility] INVASIVE CV LAB;  Service: Cardiovascular;  Laterality: N/A;   TOTAL HIP ARTHROPLASTY Right 11/07/2013   Procedure: RIGHT TOTAL HIP ARTHROPLASTY;  Surgeon: Nestor Lewandowsky, MD;  Location: [facility] OR;  Service: Orthopedics;  Laterality: Right;    Current Medications: Current Meds  Medication Sig   acetaminophen (TYLENOL) 650 MG CR tablet Take 1,300 mg by mouth every 8 (eight) hours as needed for pain.   amLODipine (NORVASC) 5 MG tablet TAKE ONE TABLET BY MOUTH DAILY   ascorbic acid (VITAMIN C) 1000 MG tablet Take 1,000 mg by mouth daily.   Cholecalciferol (VITAMIN D3 GUMMIES PO) Take 5,000 Units by mouth daily.   ELIQUIS 5 MG TABS tablet TAKE ONE TABLET BY MOUTH TWO TIMES A DAY   fluorouracil (EFUDEX) 5 % cream Apply topically 2 (two) times daily. X 1 week to scalp   fluticasone (FLONASE) 50 MCG/ACT nasal spray Place 1 spray into both nostrils daily as needed for allergies or rhinitis.   ketoconazole (NIZORAL) 2 % cream Apply to affected areas at scalp three times weekly at bedtime.   losartan (COZAAR) 50 MG tablet Take 1 tablet (50 mg total) by mouth daily.   nitroGLYCERIN (NITROSTAT) 0.4 MG SL tablet Place 1 tablet (  0.4 mg total) under the tongue every 5 (five) minutes as needed for chest pain.   rosuvastatin (CRESTOR) 40 MG tablet TAKE ONE TABLET BY MOUTH DAILY AT 6 IN THE EVENING   Turmeric 500 MG CAPS Take 1 capsule by mouth daily.   Zinc 100 MG TABS Take 100 mg by mouth daily.   [DISCONTINUED] metoprolol succinate (TOPROL-XL) 50 MG 24 hr tablet Take 50 mg by mouth 2 (two) times daily.     Allergies:   Patient has no known allergies.   Social History   Socioeconomic History   Marital status: Married    Spouse name: Not on file   Number of children: Not on  file   Years of education: Not on file   Highest education level: Not on file  Occupational History   Not on file  Tobacco Use   Smoking status: Never   Smokeless tobacco: Never   Tobacco comments:    Never smoke 10/27/21  Substance and Sexual Activity   Alcohol use: Not Currently    Alcohol/week: 6.0 standard drinks of alcohol    Types: 6 Cans of beer per week    Comment: stop drinking 10/13/22   Drug use: Never   Sexual activity: Not on file  Other Topics Concern   Not on file  Social History Narrative   Not on file   Social Determinants of Health   Financial Resource Strain: Low Risk  (09/28/2022)   Received from [facility]   Overall Financial Resource Strain (CARDIA)    Difficulty of Paying Living Expenses: Not hard at all  Food Insecurity: No Food Insecurity (09/28/2022)   Received from [facility]   Hunger Vital Sign    Worried About Running Out of Food in the Last Year: Never true    Ran Out of Food in the Last Year: Never true  Transportation Needs: No Transportation Needs (09/28/2022)   Received from [facility] - Transportation    In the past 12 months, has lack of transportation kept you from medical appointments or from getting medications?: No    Lack of Transportation (Non-Medical): No  Physical Activity: Not on file  Stress: Not on file  Social Connections: Not on file     Family History: The patient's family history includes Atrial fibrillation in his brother; Heart attack (age of onset: 23) in his father; Stroke (age of onset: 54) in his mother.  ROS:   Please see the history of present illness.    All other systems reviewed and are negative.  EKGs/Labs/Other Studies Reviewed:    The following studies were reviewed today: Cardiac Cath 04/21/2019: Multivessel CAD with 95% proximal LAD stenosis followed by 80% stenosis after the first septal perforating artery and 30% mid LAD stenosis in a  large LAD vessel which wraps around the LV apex; 20% smooth ostial stenosis of the circumflex coronary artery followed by 40% somewhat eccentric proximal stenosis and 20% mid stenosis; and dominant RCA with 20% mid stenosis.   Successful percutaneous coronary intervention to the proximal to mid LAD lesions with ultimate insertion of a 3.0 x 32 mm Synergy DES stent postdilated to 3.25 mm with the 95 and 80% stenoses being reduced to 0%.  There is brisk TIMI-3 flow and no evidence for dissection.   LVEDP 31 mm Hg.   RECOMMENDATION: DAPT therapy for minimum of 12 months.  Medical therapy for mild concomitant CAD with optimal blood pressure lowering with  target less than 130/80 and ideal less than 120/80, aggressive lipid-lowering therapy with target LDL less than 70.     Echo 04/14/2020:  1. Left ventricular ejection fraction, by estimation, is 55 to 60%. Left  ventricular ejection fraction by PLAX is 59 %. The left ventricle has  normal function. The left ventricle has no regional wall motion  abnormalities. There is mild concentric left  ventricular hypertrophy. Left ventricular diastolic parameters were  normal.   2. Right ventricular systolic function is normal. The right ventricular  size is normal.   3. Left atrial size was mildly dilated.   4. The mitral valve is normal in structure. Trivial mitral valve  regurgitation. No evidence of mitral stenosis.   5. The aortic valve is tricuspid. Aortic valve regurgitation is not  visualized. No aortic stenosis is present.   6. Aortic dilatation noted. There is mild dilatation of the aortic root,  measuring 42 mm. There is mild dilatation of the ascending aorta,  measuring 41 mm.   7. The inferior vena cava is normal in size with greater than 50%  respiratory variability, suggesting right atrial pressure of 3 mmHg.   Recent Labs: 06/28/2022: BUN 19; Creatinine, Ser 0.84; Hemoglobin 14.6; Platelets 226; Potassium 4.4; Sodium 141  Recent Lipid  Panel    Component Value Date/Time   CHOL 112 04/14/2020 0733   TRIG 63 04/14/2020 0733   HDL 50 04/14/2020 0733   CHOLHDL 2.2 04/14/2020 0733   CHOLHDL 3.6 04/19/2019 1819   VLDL 33 04/19/2019 1819   LDLCALC 48 04/14/2020 0733     Risk Assessment/Calculations:    CHA2DS2-VASc Score = 3   This indicates a 3.2% annual risk of stroke. The patient's score is based upon: CHF History: 0 HTN History: 1 Diabetes History: 0 Stroke History: 0 Vascular Disease History: 1 Age Score: 1 Gender Score: 0               Physical Exam:    VS:  BP 110/72   Pulse 68   Ht 6\' 7"  (2.007 m)   Wt 242 lb 3.2 oz (109.9 kg)   SpO2 98%   BMI 27.28 kg/m     Wt Readings from Last 3 Encounters:  10/23/22 242 lb 3.2 oz (109.9 kg)  10/13/22 246 lb (111.6 kg)  06/28/22 252 lb 3.2 oz (114.4 kg)     GEN:  Well nourished, well developed in no acute distress HEENT: Normal NECK: No JVD; No carotid bruits LYMPHATICS: No lymphadenopathy CARDIAC: RRR, no murmurs, rubs, gallops RESPIRATORY:  Clear to auscultation without rales, wheezing or rhonchi  ABDOMEN: Soft, non-tender, non-distended MUSCULOSKELETAL:  No edema; No deformity  SKIN: Warm and dry NEUROLOGIC:  Alert and oriented x 3 PSYCHIATRIC:  Normal affect   ASSESSMENT:    1. Persistent atrial fibrillation (HCC)   2. Hypercoagulable state due to persistent atrial fibrillation (HCC)   3. Essential hypertension   4. Coronary artery disease involving native coronary artery of native heart without angina pectoris   5. Mixed hyperlipidemia    PLAN:    In order of problems listed above:  Maintaining sinus rhythm.  Currently wearing a heart monitor that was placed at [facility].  Continue apixaban for anticoagulation.  Continue metoprolol succinate.  Also followed by EP.  I will see him back in 1 year. Tolerating apixaban without bleeding problems. Blood pressure well-controlled on amlodipine, losartan, and metoprolol succinate.  Creatinine is  0.8, potassium 4.4. No anginal symptoms.  Not on antiplatelet therapy  because of chronic oral anticoagulation.  Patient treated with a high intensity statin drug. LDL cholesterol is 52.  Continue rosuvastatin 40 mg daily.     Medication Adjustments/Labs and Tests Ordered: Current medicines are reviewed at length with the patient today.  Concerns regarding medicines are outlined above.  No orders of the defined types were placed in this encounter.  Meds ordered this encounter  Medications   metoprolol succinate (TOPROL-XL) 50 MG 24 hr tablet    Sig: Take 1 tablet (50 mg total) by mouth 2 (two) times daily.    Dispense:  180 tablet    Refill:  3    Patient Instructions  Medication Instructions:  Your physician recommends that you continue on your current medications as directed. Please refer to the Current Medication list given to you today.  *If you need a refill on your cardiac medications before your next appointment, please call your pharmacy*   Lab Work: NONE If you have labs (blood work) drawn today and your tests are completely normal, you will receive your results only by: MyChart Message (if you have MyChart) OR A paper copy in the mail If you have any lab test that is abnormal or we need to change your treatment, we will call you to review the results.   Testing/Procedures: NONE   Follow-Up: At [facility], you and your health needs are our priority.  As part of our continuing mission to provide you with exceptional heart care, we have created designated Provider Care Teams.  These Care Teams include your primary Cardiologist (physician) and Advanced Practice Providers (APPs -  Physician Assistants and Nurse Practitioners) who all work together to provide you with the care you need, when you need it.  We recommend signing up for the patient portal called "MyChart".  Sign up information is provided on this After Visit Summary.  MyChart is used to connect with  patients for Virtual Visits (Telemedicine).  Patients are able to view lab/test results, encounter notes, upcoming appointments, etc.  Non-urgent messages can be sent to your provider as well.   To learn more about what you can do with MyChart, go to [email or web address].    Your next appointment:   1 year(s)  Provider:   Tonny Bollman, MD        Signed, Tonny Bollman, MD  10/23/2022 4:44 PM    [facility]

## 2022-10-23 NOTE — Patient Instructions (Signed)
Medication Instructions:  Your physician recommends that you continue on your current medications as directed. Please refer to the Current Medication list given to you today.  *If you need a refill on your cardiac medications before your next appointment, please call your pharmacy*   Lab Work: NONE If you have labs (blood work) drawn today and your tests are completely normal, you will receive your results only by: MyChart Message (if you have MyChart) OR A paper copy in the mail If you have any lab test that is abnormal or we need to change your treatment, we will call you to review the results.   Testing/Procedures: NONE   Follow-Up: At [facility], you and your health needs are our priority.  As part of our continuing mission to provide you with exceptional heart care, we have created designated Provider Care Teams.  These Care Teams include your primary Cardiologist (physician) and Advanced Practice Providers (APPs -  Physician Assistants and Nurse Practitioners) who all work together to provide you with the care you need, when you need it.  We recommend signing up for the patient portal called "MyChart".  Sign up information is provided on this After Visit Summary.  MyChart is used to connect with patients for Virtual Visits (Telemedicine).  Patients are able to view lab/test results, encounter notes, upcoming appointments, etc.  Non-urgent messages can be sent to your provider as well.   To learn more about what you can do with MyChart, go to [email or web address].    Your next appointment:   1 year(s)  Provider:   Michael Cooper, MD      

## 2022-11-01 DIAGNOSIS — Z981 Arthrodesis status: Secondary | ICD-10-CM | POA: Diagnosis not present

## 2022-11-01 DIAGNOSIS — M4606 Spinal enthesopathy, lumbar region: Secondary | ICD-10-CM | POA: Diagnosis not present

## 2022-11-01 DIAGNOSIS — M51369 Other intervertebral disc degeneration, lumbar region without mention of lumbar back pain or lower extremity pain: Secondary | ICD-10-CM | POA: Diagnosis not present

## 2022-11-10 DIAGNOSIS — Z471 Aftercare following joint replacement surgery: Secondary | ICD-10-CM | POA: Diagnosis not present

## 2022-11-10 DIAGNOSIS — Z96642 Presence of left artificial hip joint: Secondary | ICD-10-CM | POA: Diagnosis not present

## 2022-11-10 DIAGNOSIS — M48061 Spinal stenosis, lumbar region without neurogenic claudication: Secondary | ICD-10-CM | POA: Diagnosis not present

## 2022-11-10 DIAGNOSIS — I480 Paroxysmal atrial fibrillation: Secondary | ICD-10-CM | POA: Diagnosis not present

## 2022-11-10 DIAGNOSIS — I214 Non-ST elevation (NSTEMI) myocardial infarction: Secondary | ICD-10-CM | POA: Diagnosis not present

## 2022-11-14 DIAGNOSIS — M25552 Pain in left hip: Secondary | ICD-10-CM | POA: Diagnosis not present

## 2022-11-14 DIAGNOSIS — M4326 Fusion of spine, lumbar region: Secondary | ICD-10-CM | POA: Diagnosis not present

## 2022-11-15 ENCOUNTER — Other Ambulatory Visit: Payer: Self-pay | Admitting: Physician Assistant

## 2022-11-15 DIAGNOSIS — I4819 Other persistent atrial fibrillation: Secondary | ICD-10-CM

## 2022-11-15 NOTE — Telephone Encounter (Signed)
Prescription refill request for Eliquis received. Indication: Afib  Last office visit: 10/23/22 Excell Seltzer)  Scr: 0.9 (09/25/22)  Age: 72 Weight: 109.9kg  Appropriate dose. Refill sent.

## 2022-11-28 ENCOUNTER — Ambulatory Visit: Payer: Medicare Other | Admitting: Dermatology

## 2022-11-28 DIAGNOSIS — D1801 Hemangioma of skin and subcutaneous tissue: Secondary | ICD-10-CM

## 2022-11-28 DIAGNOSIS — Z86018 Personal history of other benign neoplasm: Secondary | ICD-10-CM | POA: Diagnosis not present

## 2022-11-28 DIAGNOSIS — Z1283 Encounter for screening for malignant neoplasm of skin: Secondary | ICD-10-CM | POA: Diagnosis not present

## 2022-11-28 DIAGNOSIS — Z85828 Personal history of other malignant neoplasm of skin: Secondary | ICD-10-CM

## 2022-11-28 DIAGNOSIS — D229 Melanocytic nevi, unspecified: Secondary | ICD-10-CM

## 2022-11-28 DIAGNOSIS — L814 Other melanin hyperpigmentation: Secondary | ICD-10-CM | POA: Diagnosis not present

## 2022-11-28 DIAGNOSIS — W908XXA Exposure to other nonionizing radiation, initial encounter: Secondary | ICD-10-CM | POA: Diagnosis not present

## 2022-11-28 DIAGNOSIS — L570 Actinic keratosis: Secondary | ICD-10-CM

## 2022-11-28 DIAGNOSIS — L821 Other seborrheic keratosis: Secondary | ICD-10-CM | POA: Diagnosis not present

## 2022-11-28 DIAGNOSIS — L578 Other skin changes due to chronic exposure to nonionizing radiation: Secondary | ICD-10-CM | POA: Diagnosis not present

## 2022-11-28 DIAGNOSIS — L820 Inflamed seborrheic keratosis: Secondary | ICD-10-CM

## 2022-11-28 NOTE — Progress Notes (Signed)
Follow-Up Visit   Subjective  Paul Acosta is a 72 y.o. male who presents for the following: Skin Cancer Screening and Full Body Skin Exam  The patient presents for Total-Body Skin Exam (TBSE) for skin cancer screening and mole check. The patient has spots, moles and lesions to be evaluated, some may be new or changing and the patient may have concern these could be cancer.  The following portions of the chart were reviewed this encounter and updated as appropriate: medications, allergies, medical history  Review of Systems:  No other skin or systemic complaints except as noted in HPI or Assessment and Plan.  Objective  Well appearing patient in no apparent distress; mood and affect are within normal limits.  A full examination was performed including scalp, head, eyes, ears, nose, lips, neck, chest, axillae, abdomen, back, buttocks, bilateral upper extremities, bilateral lower extremities, hands, feet, fingers, toes, fingernails, and toenails. All findings within normal limits unless otherwise noted below.   Relevant physical exam findings are noted in the Assessment and Plan.  Scalp x 1 Erythematous thin papules/macules with gritty scale.   R lat clavicle x 1 Erythematous stuck-on, waxy papule or plaque    Assessment & Plan   SKIN CANCER SCREENING PERFORMED TODAY.  ACTINIC DAMAGE - Chronic condition, secondary to cumulative UV/sun exposure - diffuse scaly erythematous macules with underlying dyspigmentation - Recommend daily broad spectrum sunscreen SPF 30+ to sun-exposed areas, reapply every 2 hours as needed.  - Staying in the shade or wearing long sleeves, sun glasses (UVA+UVB protection) and wide brim hats (4-inch brim around the entire circumference of the hat) are also recommended for sun protection.  - Call for new or changing lesions.  LENTIGINES, SEBORRHEIC KERATOSES, HEMANGIOMAS - Benign normal skin lesions - Benign-appearing - Call for any  changes  MELANOCYTIC NEVI - Tan-brown and/or pink-flesh-colored symmetric macules and papules - Benign appearing on exam today - Observation - Call clinic for new or changing moles - Recommend daily use of broad spectrum spf 30+ sunscreen to sun-exposed areas.   HISTORY OF BASAL CELL CARCINOMA OF THE SKIN - No evidence of recurrence today - Recommend regular full body skin exams - Recommend daily broad spectrum sunscreen SPF 30+ to sun-exposed areas, reapply every 2 hours as needed.  - Call if any new or changing lesions are noted between office visits  HISTORY OF DYSPLASTIC NEVUS No evidence of recurrence today Recommend regular full body skin exams Recommend daily broad spectrum sunscreen SPF 30+ to sun-exposed areas, reapply every 2 hours as needed.  Call if any new or changing lesions are noted between office visits  AK (actinic keratosis) Scalp x 1  Actinic keratoses are precancerous spots that appear secondary to cumulative UV radiation exposure/sun exposure over time. They are chronic with expected duration over 1 year. A portion of actinic keratoses will progress to squamous cell carcinoma of the skin. It is not possible to reliably predict which spots will progress to skin cancer and so treatment is recommended to prevent development of skin cancer.  Recommend daily broad spectrum sunscreen SPF 30+ to sun-exposed areas, reapply every 2 hours as needed.  Recommend staying in the shade or wearing long sleeves, sun glasses (UVA+UVB protection) and wide brim hats (4-inch brim around the entire circumference of the hat). Call for new or changing lesions.   Destruction of lesion - Scalp x 1 Complexity: simple   Destruction method: cryotherapy   Informed consent: discussed and consent obtained   Timeout:  patient name, date of birth, surgical site, and procedure verified Lesion destroyed using liquid nitrogen: Yes   Region frozen until ice ball extended beyond lesion: Yes    Outcome: patient tolerated procedure well with no complications   Post-procedure details: wound care instructions given    Inflamed seborrheic keratosis R lat clavicle x 1  Symptomatic, irritating, patient would like treated.   Destruction of lesion - R lat clavicle x 1 Complexity: simple   Destruction method: cryotherapy   Informed consent: discussed and consent obtained   Timeout:  patient name, date of birth, surgical site, and procedure verified Lesion destroyed using liquid nitrogen: Yes   Region frozen until ice ball extended beyond lesion: Yes   Outcome: patient tolerated procedure well with no complications   Post-procedure details: wound care instructions given     Return in about 1 year (around 11/28/2023) for TBSE.  Maylene Roes, CMA, am acting as scribe for Armida Sans, MD .   Documentation: I have reviewed the above documentation for accuracy and completeness, and I agree with the above.  Armida Sans, MD

## 2022-11-28 NOTE — Patient Instructions (Signed)

## 2022-11-29 ENCOUNTER — Other Ambulatory Visit: Payer: Self-pay | Admitting: Cardiovascular Disease

## 2022-12-05 ENCOUNTER — Encounter: Payer: Self-pay | Admitting: Dermatology

## 2022-12-26 NOTE — Progress Notes (Unsigned)
  Electrophysiology Office Note:   Date:  12/27/2022  ID:  ELBER GOSCINSKI, DOB 1950/11/01, MRN [id number]  Primary Cardiologist: Tonny Bollman, MD Electrophysiologist: Regan Lemming, MD      History of Present Illness:   Paul Acosta is a 71 y.o. male with h/o coronary artery disease post LAD stent, dilated aortic root, hypertension, hyperlipidemia, atrial fibrillation post ablation 09/29/2021 seen today for routine electrophysiology followup.   Since last being seen in our clinic the patient reports doing well.  He had neck surgery in September and had atrial fibrillation around that time.  Since then, he has noted no further episodes of atrial fibrillation.  He is undergoing rehab for his neck and has been lifting weights and doing cardio.  He has no acute complaints.  he denies chest pain, palpitations, dyspnea, PND, orthopnea, nausea, vomiting, dizziness, syncope, edema, weight gain, or early satiety.   Review of systems complete and found to be negative unless listed in HPI.   EP Information / Studies Reviewed:    EKG is not ordered today. EKG from 10/13/22 reviewed which showed sinus rhythm, RBBB        Risk Assessment/Calculations:    CHA2DS2-VASc Score = 3   This indicates a 3.2% annual risk of stroke. The patient's score is based upon: CHF History: 0 HTN History: 1 Diabetes History: 0 Stroke History: 0 Vascular Disease History: 1 Age Score: 1 Gender Score: 0             Physical Exam:   VS:  BP 124/82 (BP Location: Left Arm, Patient Position: Sitting, Cuff Size: Large)   Pulse 68   Ht 6\' 7"  (2.007 m)   Wt 261 lb 12.8 oz (118.8 kg)   SpO2 95%   BMI 29.49 kg/m    Wt Readings from Last 3 Encounters:  12/27/22 261 lb 12.8 oz (118.8 kg)  10/23/22 242 lb 3.2 oz (109.9 kg)  10/13/22 246 lb (111.6 kg)     GEN: Well nourished, well developed in no acute distress NECK: No JVD; No carotid bruits CARDIAC: Regular rate and rhythm, no murmurs, rubs,  gallops RESPIRATORY:  Clear to auscultation without rales, wheezing or rhonchi  ABDOMEN: Soft, non-tender, non-distended EXTREMITIES:  No edema; No deformity   ASSESSMENT AND PLAN:    1.  Persistent atrial fibrillation: Post ablation 09/29/2021.  He had an episode of atrial fibrillation around the time of a neck surgery.  He has had no further episodes.  He is feeling well.  Jaquavius Hudler stop metoprolol today.  2.  Secondary to coagula state: Currently on Eliquis for atrial fibrillation  3.  Coronary disease: No current chest pain  4.  Hypertension: Currently well-controlled  Follow up with Dr. Elberta Fortis in 12 months  Signed, Kingstin Heims Jorja Loa, MD

## 2022-12-27 ENCOUNTER — Encounter: Payer: Self-pay | Admitting: Cardiology

## 2022-12-27 ENCOUNTER — Ambulatory Visit: Payer: Medicare Other | Attending: Cardiology | Admitting: Cardiology

## 2022-12-27 VITALS — BP 124/82 | HR 68 | Ht 79.0 in | Wt 261.8 lb

## 2022-12-27 DIAGNOSIS — I10 Essential (primary) hypertension: Secondary | ICD-10-CM | POA: Diagnosis not present

## 2022-12-27 DIAGNOSIS — D6869 Other thrombophilia: Secondary | ICD-10-CM | POA: Diagnosis not present

## 2022-12-27 DIAGNOSIS — I2510 Atherosclerotic heart disease of native coronary artery without angina pectoris: Secondary | ICD-10-CM

## 2022-12-27 DIAGNOSIS — I4819 Other persistent atrial fibrillation: Secondary | ICD-10-CM

## 2022-12-27 NOTE — Patient Instructions (Signed)

## 2022-12-27 NOTE — Addendum Note (Signed)
Addended by: Baird Lyons on: 12/27/2022 09:00 AM   Modules accepted: Orders

## 2022-12-28 DIAGNOSIS — M25552 Pain in left hip: Secondary | ICD-10-CM | POA: Diagnosis not present

## 2022-12-28 DIAGNOSIS — M4326 Fusion of spine, lumbar region: Secondary | ICD-10-CM | POA: Diagnosis not present

## 2023-01-02 ENCOUNTER — Encounter: Payer: Self-pay | Admitting: Cardiology

## 2023-01-04 MED ORDER — METOPROLOL SUCCINATE ER 50 MG PO TB24: 50.0000 mg | ORAL_TABLET | Freq: Two times a day (BID) | ORAL | Status: DC

## 2023-01-04 NOTE — Telephone Encounter (Signed)
Added medication back to pt's medication list

## 2023-02-14 DIAGNOSIS — M51369 Other intervertebral disc degeneration, lumbar region without mention of lumbar back pain or lower extremity pain: Secondary | ICD-10-CM | POA: Diagnosis not present

## 2023-02-14 DIAGNOSIS — M533 Sacrococcygeal disorders, not elsewhere classified: Secondary | ICD-10-CM | POA: Diagnosis not present

## 2023-02-14 DIAGNOSIS — Z981 Arthrodesis status: Secondary | ICD-10-CM | POA: Diagnosis not present

## 2023-02-14 DIAGNOSIS — M4606 Spinal enthesopathy, lumbar region: Secondary | ICD-10-CM | POA: Diagnosis not present

## 2023-02-14 DIAGNOSIS — M4326 Fusion of spine, lumbar region: Secondary | ICD-10-CM | POA: Diagnosis not present

## 2023-03-05 DIAGNOSIS — M533 Sacrococcygeal disorders, not elsewhere classified: Secondary | ICD-10-CM | POA: Diagnosis not present

## 2023-04-05 DIAGNOSIS — N529 Male erectile dysfunction, unspecified: Secondary | ICD-10-CM | POA: Diagnosis not present

## 2023-04-05 DIAGNOSIS — E7800 Pure hypercholesterolemia, unspecified: Secondary | ICD-10-CM | POA: Diagnosis not present

## 2023-04-05 DIAGNOSIS — I2510 Atherosclerotic heart disease of native coronary artery without angina pectoris: Secondary | ICD-10-CM | POA: Diagnosis not present

## 2023-04-05 DIAGNOSIS — D6869 Other thrombophilia: Secondary | ICD-10-CM | POA: Diagnosis not present

## 2023-04-05 DIAGNOSIS — I480 Paroxysmal atrial fibrillation: Secondary | ICD-10-CM | POA: Diagnosis not present

## 2023-04-05 DIAGNOSIS — I10 Essential (primary) hypertension: Secondary | ICD-10-CM | POA: Diagnosis not present

## 2023-04-09 ENCOUNTER — Encounter: Payer: Self-pay | Admitting: Dermatology

## 2023-04-09 DIAGNOSIS — L219 Seborrheic dermatitis, unspecified: Secondary | ICD-10-CM

## 2023-04-09 MED ORDER — KETOCONAZOLE 2 % EX CREA: TOPICAL_CREAM | CUTANEOUS | 6 refills | Status: AC

## 2023-05-07 DIAGNOSIS — I10 Essential (primary) hypertension: Secondary | ICD-10-CM | POA: Diagnosis not present

## 2023-05-07 DIAGNOSIS — I480 Paroxysmal atrial fibrillation: Secondary | ICD-10-CM | POA: Diagnosis not present

## 2023-05-07 DIAGNOSIS — I2510 Atherosclerotic heart disease of native coronary artery without angina pectoris: Secondary | ICD-10-CM | POA: Diagnosis not present

## 2023-05-16 DIAGNOSIS — I10 Essential (primary) hypertension: Secondary | ICD-10-CM | POA: Diagnosis not present

## 2023-05-16 DIAGNOSIS — I2510 Atherosclerotic heart disease of native coronary artery without angina pectoris: Secondary | ICD-10-CM | POA: Diagnosis not present

## 2023-05-16 DIAGNOSIS — E7800 Pure hypercholesterolemia, unspecified: Secondary | ICD-10-CM | POA: Diagnosis not present

## 2023-05-16 DIAGNOSIS — I480 Paroxysmal atrial fibrillation: Secondary | ICD-10-CM | POA: Diagnosis not present

## 2023-05-23 ENCOUNTER — Telehealth: Payer: Self-pay | Admitting: Cardiovascular Disease

## 2023-05-23 ENCOUNTER — Other Ambulatory Visit: Payer: Self-pay | Admitting: *Deleted

## 2023-05-23 DIAGNOSIS — I4819 Other persistent atrial fibrillation: Secondary | ICD-10-CM

## 2023-05-23 MED ORDER — APIXABAN 5 MG PO TABS: 5.0000 mg | ORAL_TABLET | Freq: Two times a day (BID) | ORAL | 1 refills | Status: DC

## 2023-05-23 NOTE — Telephone Encounter (Signed)
 Eliquis  5mg  refill request received. Patient is 73 years old, weight-118.8kg, Crea-0.84 on v6/12/24, Diagnosis-Afib, and last seen by Dr. Lawana Pray on 12/27/22. Dose is appropriate based on dosing criteria. Will send in refill to requested pharmacy.

## 2023-05-23 NOTE — Telephone Encounter (Signed)
 Pt c/o of Chest Pain: STAT if active CP, including tightness, pressure, jaw pain, radiating pain to shoulder/upper arm/back, CP unrelieved by Nitro. Symptoms reported of SOB, nausea, vomiting, sweating.  1. Are you having CP right now? No     2. Are you experiencing any other symptoms (ex. SOB, nausea, vomiting, sweating)? No    3. Is your CP continuous or coming and going? Very sporadic    4. Have you taken Nitroglycerin ? No   5. How long have you been experiencing CP? Maybe three times for a brief timeframe and that is over a period of a month-it is very random    6. If NO CP at time of call then end call with telling Pt to call back or call 911 if Chest pain returns prior to return call from triage team.   Answers received via pt schedules. Please advise.

## 2023-05-23 NOTE — Telephone Encounter (Signed)
 Called and spoke to pt regarding the CP. He states it is random and has happened 3 times in the last 30 days. Describes as a "cramp to the right side, then one time on left side" always posterior not anterior. The CP does not radiate and is not accompanied by any other Symptoms (denies SOB, dizziness/lightheadedness). He checks BP daily and it is on average 110/65. He has not had to take any SL NTG for it. He states it is similar (but much less intense) to the CP he was having about 4 years ago when he had a stent placed. Appointment with Cleaver, NP made for 06/12/23 and added to wait list, per request of pt. ER precautions reviewed, pt verbalized understanding.

## 2023-06-05 DIAGNOSIS — I10 Essential (primary) hypertension: Secondary | ICD-10-CM | POA: Diagnosis not present

## 2023-06-05 DIAGNOSIS — I2510 Atherosclerotic heart disease of native coronary artery without angina pectoris: Secondary | ICD-10-CM | POA: Diagnosis not present

## 2023-06-05 DIAGNOSIS — I480 Paroxysmal atrial fibrillation: Secondary | ICD-10-CM | POA: Diagnosis not present

## 2023-06-07 NOTE — Progress Notes (Unsigned)
 Cardiology Clinic Note   Patient Name: Paul Acosta Date of Encounter: 06/12/2023  Primary Care Provider:  Vidal Graven, MD Primary Cardiologist:  Arnoldo Lapping, MD  Patient Profile    Paul Acosta 73 year old male presents to the clinic today for an evaluation of his chest pain.  Past Medical History    Past Medical History:  Diagnosis Date   Basal cell carcinoma 01/28/2015   R post auricular neck - ED&C    Dysplastic nevus 07/27/2009   R med pectoral - mild    Hypertension    Medical history non-contributory    Past Surgical History:  Procedure Laterality Date   ATRIAL FIBRILLATION ABLATION N/A 09/29/2021   Procedure: ATRIAL FIBRILLATION ABLATION;  Surgeon: Lei Pump, MD;  Location: [facility];  Service: Cardiovascular;  Laterality: N/A;   EYE SURGERY     LEFT HEART CATH AND CORONARY ANGIOGRAPHY N/A 04/21/2019   Procedure: LEFT HEART CATH AND CORONARY ANGIOGRAPHY;  Surgeon: Millicent Ally, MD;  Location: [facility];  Service: Cardiovascular;  Laterality: N/A;   TOTAL HIP ARTHROPLASTY Right 11/07/2013   Procedure: RIGHT TOTAL HIP ARTHROPLASTY;  Surgeon: Ilean Mall, MD;  Location: [facility] OR;  Service: Orthopedics;  Laterality: Right;    Allergies  No Known Allergies  History of Present Illness    Paul Acosta has a PMH of NSTEMI 4/21 with PCI and DES to his LAD, AS HK with preserved EF, echocardiogram 3/22 showed an EF of 55-60%, mild LVH, LAE.  He was noted to have dilated aortic root measuring 42 mm and the ascending aorta 41 mm.  His PMH also includes HTN, HLD, paroxysmal atrial fibrillation which was diagnosed 12/22.  He spontaneously converted back to sinus rhythm at follow-up in A-fib clinic.  He had recurrent atrial fibrillation 3/23 and underwent A-fib ablation 9/23.  CHA2DS2-VASc score 3  He was seen in follow-up by Dr. Arlester Ladd on 10/23/2022.  During that time he noted that he had spinal fusion the prior month at [facility] and noted that  it was a long surgery.  Postoperatively he had recurrent atrial fibrillation and was seen by their cardiology team.  He converted back to sinus rhythm and denied further episodes of irregular or accelerated heartbeat.  He denied chest pain, pressure, shortness of breath.  He recovered well and was noted to be about 4 weeks postoperative.  He was hoping to be released back to regular physical activity.  His blood pressure was noted to be 110/72.  He contacted the cardiology clinic on 05/23/2023.  He noted that he was having a cramp on his right side.  He described chest pain 3 times in the past 30 days.  His pain did not radiate and was not accompanied by any other symptoms.  He was monitoring his blood pressure which was well-controlled.  He had not taken any sublingual nitroglycerin .  He reported that his symptoms were similar to his chest pain prior to having his stent placed 4 years ago.  He was added to my schedule.  He presents to the clinic today for evaluation and states he has had a couple episodes of chest discomfort over the last couple months.  These happened on his right side around his left shoulder blade.  His episodes lasted for about 30-45 seconds and then dissipated.  His blood pressure has been well-controlled.  We reviewed his previous cardiac catheterization and symptoms that led up to his stenting.  He denies  exertional chest pain.  He does note that he does exercise 5 times per week.  He lifts 2-3 times per week and also uses a rowing machine.  His diet is well-controlled.  He is concerned about his weight gain.  He notes that he retired in January.  I will order a BMP, CBC and echocardiogram for further prognostication.  We also discussed performing a stress test possible cardiac PET CT which we will defer at this time.  I will plan follow-up after testing.  Today he denies shortness of breath, lower extremity edema, fatigue, palpitations, melena, hematuria, hemoptysis, diaphoresis,  weakness, presyncope, syncope, orthopnea, and PND.       Home Medications    Prior to Admission medications   Medication Sig Start Date End Date Taking? Authorizing Provider  acetaminophen  (TYLENOL ) 650 MG CR tablet Take 1,300 mg by mouth every 8 (eight) hours as needed for pain.    [provider]  amLODipine  (NORVASC ) 5 MG tablet TAKE ONE TABLET BY MOUTH DAILY 12/13/20   Arnoldo Lapping, MD  apixaban  (ELIQUIS ) 5 MG TABS tablet Take 1 tablet (5 mg total) by mouth 2 (two) times daily. 05/23/23   Camnitz, Babetta Lesch, MD  ascorbic acid (VITAMIN C) 1000 MG tablet Take 1,000 mg by mouth daily.    [provider]  Cholecalciferol (VITAMIN D3 GUMMIES PO) Take 5,000 Units by mouth daily.    [provider]  fluticasone (FLONASE) 50 MCG/ACT nasal spray Place 1 spray into both nostrils daily as needed for allergies or rhinitis.    [provider]  ketoconazole  (NIZORAL ) 2 % cream Apply to affected areas at scalp three times weekly at bedtime. 04/09/23   Elta Halter, MD  losartan  (COZAAR ) 50 MG tablet TAKE 1 TABLET BY MOUTH DAILY 11/30/22   Arnoldo Lapping, MD  metoprolol  succinate (TOPROL -XL) 50 MG 24 hr tablet Take 1 tablet (50 mg total) by mouth in the morning and at bedtime. Take with or immediately following a meal. 01/04/23   Camnitz, Babetta Lesch, MD  nitroGLYCERIN  (NITROSTAT ) 0.4 MG SL tablet Place 1 tablet (0.4 mg total) under the tongue every 5 (five) minutes as needed for chest pain. 04/22/19   Swayze, Ava, DO  rosuvastatin  (CRESTOR ) 40 MG tablet TAKE 1 TABLET BY MOUTH DAILY AT 6 IN THE EVENING 11/30/22   Arnoldo Lapping, MD  Turmeric 500 MG CAPS Take 1 capsule by mouth daily.    [provider]  Zinc 100 MG TABS Take 100 mg by mouth daily.    [provider]    Family History    Family History  Problem Relation Age of Onset   Stroke Mother 71   Heart attack Father 70   Atrial fibrillation Brother    He indicated that the  status of his mother is unknown. He indicated that the status of his father is unknown. He indicated that the status of his brother is unknown.  Social History    Social History   Socioeconomic History   Marital status: Married    Spouse name: Not on file   Number of children: Not on file   Years of education: Not on file   Highest education level: Not on file  Occupational History   Not on file  Tobacco Use   Smoking status: Never   Smokeless tobacco: Never   Tobacco comments:    Never smoke 10/27/21  Substance and Sexual Activity   Alcohol use: Not Currently    Alcohol/week: 6.0 standard  drinks of alcohol    Types: 6 Cans of beer per week    Comment: stop drinking 10/13/22   Drug use: Never   Sexual activity: Not on file  Other Topics Concern   Not on file  Social History Narrative   Not on file   Social Drivers of Health   Financial Resource Strain: Low Risk  (09/28/2022)   Received from [facility]   Overall Financial Resource Strain (CARDIA)    Difficulty of Paying Living Expenses: Not hard at all  Food Insecurity: No Food Insecurity (09/28/2022)   Received from [facility]   Hunger Vital Sign    Worried About Running Out of Food in the Last Year: Never true    Ran Out of Food in the Last Year: Never true  Transportation Needs: No Transportation Needs (09/28/2022)   Received from [facility] - Transportation    In the past 12 months, has lack of transportation kept you from medical appointments or from getting medications?: No    Lack of Transportation (Non-Medical): No  Physical Activity: Not on file  Stress: Not on file  Social Connections: Not on file  Intimate Partner Violence: Not on file     Review of Systems    General:  No chills, fever, night sweats or weight changes.  Cardiovascular:  No chest pain, dyspnea on exertion, edema, orthopnea, palpitations, paroxysmal nocturnal  dyspnea. Dermatological: No rash, lesions/masses Respiratory: No cough, dyspnea Urologic: No hematuria, dysuria Abdominal:   No nausea, vomiting, diarrhea, bright red blood per rectum, melena, or hematemesis Neurologic:  No visual changes, wkns, changes in mental status. All other systems reviewed and are otherwise negative except as noted above.  Physical Exam    VS:  BP 122/74   Pulse 65   Ht 6\' 6"  (1.981 m)   Wt 265 lb (120.2 kg)   SpO2 91%   BMI 30.62 kg/m  , BMI Body mass index is 30.62 kg/m. GEN: Well nourished, well developed, in no acute distress. HEENT: normal. Neck: Supple, no JVD, carotid bruits, or masses. Cardiac: RRR, no murmurs, rubs, or gallops. No clubbing, cyanosis, edema.  Radials/DP/PT 2+ and equal bilaterally.  Respiratory:  Respirations regular and unlabored, clear to auscultation bilaterally. GI: Soft, nontender, nondistended, BS + x 4. MS: no deformity or atrophy. Skin: warm and dry, no rash. Neuro:  Strength and sensation are intact. Psych: Normal affect.  Accessory Clinical Findings    Recent Labs: 06/28/2022: BUN 19; Creatinine, Ser 0.84; Hemoglobin 14.6; Platelets 226; Potassium 4.4; Sodium 141   Recent Lipid Panel    Component Value Date/Time   CHOL 112 04/14/2020 0733   TRIG 63 04/14/2020 0733   HDL 50 04/14/2020 0733   CHOLHDL 2.2 04/14/2020 0733   CHOLHDL 3.6 04/19/2019 1819   VLDL 33 04/19/2019 1819   LDLCALC 48 04/14/2020 0733         ECG personally reviewed by me today- EKG Interpretation Date/Time:  Tuesday Jun 12 2023 14:45:06 EDT Ventricular Rate:  73 PR Interval:  226 QRS Duration:  140 QT Interval:  422 QTC Calculation: 464 R Axis:   -23  Text Interpretation: Sinus rhythm with 1st degree A-V block Right bundle branch block When compared with ECG of 13-Oct-2022 09:38, Left anterior fascicular block is no longer Present Confirmed by Lawana Pray [phone]) on 06/12/2023 3:12:31 PM   Echocardiogram 04/14/2020  IMPRESSIONS      1. Left ventricular ejection fraction, by  estimation, is 55 to 60%. Left  ventricular ejection fraction by PLAX is 59 %. The left ventricle has  normal function. The left ventricle has no regional wall motion  abnormalities. There is mild concentric left  ventricular hypertrophy. Left ventricular diastolic parameters were  normal.   2. Right ventricular systolic function is normal. The right ventricular  size is normal.   3. Left atrial size was mildly dilated.   4. The mitral valve is normal in structure. Trivial mitral valve  regurgitation. No evidence of mitral stenosis.   5. The aortic valve is tricuspid. Aortic valve regurgitation is not  visualized. No aortic stenosis is present.   6. Aortic dilatation noted. There is mild dilatation of the aortic root,  measuring 42 mm. There is mild dilatation of the ascending aorta,  measuring 41 mm.   7. The inferior vena cava is normal in size with greater than 50%  respiratory variability, suggesting right atrial pressure of 3 mmHg.   FINDINGS   Left Ventricle: Left ventricular ejection fraction, by estimation, is 55  to 60%. Left ventricular ejection fraction by PLAX is 59 %. The left  ventricle has normal function. The left ventricle has no regional wall  motion abnormalities. The left  ventricular internal cavity size was normal in size. There is mild  concentric left ventricular hypertrophy. Left ventricular diastolic  parameters were normal. Normal left ventricular filling pressure.   Right Ventricle: The right ventricular size is normal. No increase in  right ventricular wall thickness. Right ventricular systolic function is  normal.   Left Atrium: Left atrial size was mildly dilated.   Right Atrium: Right atrial size was normal in size.   Pericardium: There is no evidence of pericardial effusion.   Mitral Valve: The mitral valve is normal in structure. Trivial mitral  valve regurgitation. No evidence of mitral valve  stenosis.   Tricuspid Valve: The tricuspid valve is normal in structure. Tricuspid  valve regurgitation is trivial. No evidence of tricuspid stenosis.   Aortic Valve: The aortic valve is tricuspid. Aortic valve regurgitation is  not visualized. No aortic stenosis is present.   Pulmonic Valve: The pulmonic valve was normal in structure. Pulmonic valve  regurgitation is not visualized. No evidence of pulmonic stenosis.   Aorta: Aortic dilatation noted. There is mild dilatation of the aortic  root, measuring 42 mm. There is mild dilatation of the ascending aorta,  measuring 41 mm.   Venous: The inferior vena cava is normal in size with greater than 50%  respiratory variability, suggesting right atrial pressure of 3 mmHg.   IAS/Shunts: No atrial level shunt detected by color flow Doppler.   Cardiac event monitor 05/16/2021  Patch Wear Time:  14 days and 0 hours (2023-04-07T09:10:03-398 to 2023-04-21T09:10:07-398)   2 Ventricular Tachycardia runs occurred, the run with the fastest interval lasting 5 beats with a max rate of 156 bpm (avg 121 bpm); the run with the fastest interval was also the longest. Atrial Fibrillation occurred continuously (100% burden), ranging  from 41-196 bpm (avg of 82 bpm). Bundle Branch Block/IVCD was present. Isolated VEs were rare (<1.0%, 1969), VE Couplets were rare (<1.0%, 78), and VE Triplets were rare (<1.0%, 3).   SUMMARY: persistent atrial fibrillation throughout recording. Rare PVC's and ventricular runs up to 5 beats.    RECOMMEND: continue current Rx. EP eval later this month with Dr Lawana Pray as scheduled.       Assessment & Plan   1.  Coronary artery disease-no chest pain  today.  Reports 2 episodes of chest discomfort that he describes as right sided chest cramping over the past 2 months.  He reports that he does not feel this is the same pain he had with his previous cardiac catheterization.  He denies exertional chest discomfort. Order  echo Order CBC, BMP Continue metoprolol , amlodipine , losartan , rosuvastatin   Hyperlipidemia-LDL 57 on 04/05/23.  No aspirin  on apixaban  High-fiber diet Continue rosuvastatin   Essential hypertension-BP today 122/74. Maintain blood pressure log Heart healthy low-sodium diet Continue amlodipine , losartan , metoprolol   Persistent atrial fibrillation-EKG today shows sinus rhythm, first-degree AV block 73 bpm.  Denies recent episodes of accelerated or irregular heartbeat.  Compliant with apixaban .  Denies bleeding issues. Continue metoprolol , apixaban   Disposition: Follow-up with Dr. Arlester Ladd or me in 1-2 months.   Paul Acosta. Chauntae Hults NP-C     06/12/2023, 5:09 PM [facility] [address] Office [phone] Fax [phone]    I spent 14 minutes examining this patient, reviewing medications, and using patient centered shared decision making involving their cardiac care.   I spent  20 minutes reviewing past medical history,  medications, and prior cardiac tests.

## 2023-06-12 ENCOUNTER — Encounter: Payer: Self-pay | Admitting: General Practice

## 2023-06-12 ENCOUNTER — Ambulatory Visit: Attending: General Practice | Admitting: General Practice

## 2023-06-12 VITALS — BP 122/74 | HR 65 | Ht 78.0 in | Wt 265.0 lb

## 2023-06-12 DIAGNOSIS — I10 Essential (primary) hypertension: Secondary | ICD-10-CM | POA: Insufficient documentation

## 2023-06-12 DIAGNOSIS — I2510 Atherosclerotic heart disease of native coronary artery without angina pectoris: Secondary | ICD-10-CM | POA: Diagnosis not present

## 2023-06-12 DIAGNOSIS — I480 Paroxysmal atrial fibrillation: Secondary | ICD-10-CM | POA: Insufficient documentation

## 2023-06-12 DIAGNOSIS — I4819 Other persistent atrial fibrillation: Secondary | ICD-10-CM | POA: Diagnosis not present

## 2023-06-12 NOTE — Patient Instructions (Signed)
 Medication Instructions:  The current medical regimen is effective;  continue present plan and medications as directed. Please refer to the Current Medication list given to you today.  *If you need a refill on your cardiac medications before your next appointment, please call your pharmacy*  Lab Work: CBC AND BMET TODAY If you have labs (blood work) drawn today and your tests are completely normal, you will receive your results only by: MyChart Message (if you have MyChart) OR A paper copy in the mail If you have any lab test that is abnormal or we need to change your treatment, we will call you to review the results.  Testing/Procedures: Your physician has requested that you have an echocardiogram. Echocardiography is a painless test that uses sound waves to create images of your heart. It provides your doctor with information about the size and shape of your heart and how well your heart's chambers and valves are working. This procedure takes approximately one hour. There are no restrictions for this procedure. Please do NOT wear cologne, perfume, aftershave, or lotions (deodorant is allowed). Please arrive 15 minutes prior to your appointment time.  Please note: We ask at that you not bring children with you during ultrasound (echo/ vascular) testing. Due to room size and safety concerns, children are not allowed in the ultrasound rooms during exams. Our front office staff cannot provide observation of children in our lobby area while testing is being conducted. An adult accompanying a patient to their appointment will only be allowed in the ultrasound room at the discretion of the ultrasound technician under special circumstances. We apologize for any inconvenience.   Follow-Up: At [facility], you and your health needs are our priority.  As part of our continuing mission to provide you with exceptional heart care, our providers are all part of one team.  This team includes your  primary Cardiologist (physician) and Advanced Practice Providers or APPs (Physician Assistants and Nurse Practitioners) who all work together to provide you with the care you need, when you need it.  Your next appointment:   AFTER ECHO   Provider:   Arnoldo Lapping, MD or Lawana Pray, NP

## 2023-06-13 ENCOUNTER — Ambulatory Visit: Payer: Self-pay | Admitting: General Practice

## 2023-06-13 LAB — BASIC METABOLIC PANEL WITH GFR
BUN/Creatinine Ratio: 20 (ref 10–24)
BUN: 18 mg/dL (ref 8–27)
CO2: 20 mmol/L (ref 20–29)
Calcium: 9 mg/dL (ref 8.6–10.2)
Chloride: 104 mmol/L (ref 96–106)
Creatinine, Ser: 0.9 mg/dL (ref 0.76–1.27)
Glucose: 82 mg/dL (ref 70–99)
Potassium: 4.1 mmol/L (ref 3.5–5.2)
Sodium: 140 mmol/L (ref 134–144)
eGFR: 91 mL/min/{1.73_m2} (ref >59)

## 2023-06-13 LAB — CBC
Hematocrit: 44.5 % (ref 37.5–51.0)
Hemoglobin: 14.9 g/dL (ref 13.0–17.7)
MCH: 31.4 pg (ref 26.6–33.0)
MCHC: 33.5 g/dL (ref 31.5–35.7)
MCV: 94 fL (ref 79–97)
Platelets: 183 10*3/uL (ref 150–450)
RBC: 4.75 x10E6/uL (ref 4.14–5.80)
RDW: 12.9 % (ref 11.6–15.4)
WBC: 5.9 10*3/uL (ref 3.4–10.8)

## 2023-06-16 DIAGNOSIS — I480 Paroxysmal atrial fibrillation: Secondary | ICD-10-CM | POA: Diagnosis not present

## 2023-06-16 DIAGNOSIS — I2510 Atherosclerotic heart disease of native coronary artery without angina pectoris: Secondary | ICD-10-CM | POA: Diagnosis not present

## 2023-06-16 DIAGNOSIS — I10 Essential (primary) hypertension: Secondary | ICD-10-CM | POA: Diagnosis not present

## 2023-06-16 DIAGNOSIS — E7800 Pure hypercholesterolemia, unspecified: Secondary | ICD-10-CM | POA: Diagnosis not present

## 2023-07-11 DIAGNOSIS — I10 Essential (primary) hypertension: Secondary | ICD-10-CM | POA: Diagnosis not present

## 2023-07-11 DIAGNOSIS — I2510 Atherosclerotic heart disease of native coronary artery without angina pectoris: Secondary | ICD-10-CM | POA: Diagnosis not present

## 2023-07-11 DIAGNOSIS — I480 Paroxysmal atrial fibrillation: Secondary | ICD-10-CM | POA: Diagnosis not present

## 2023-07-16 DIAGNOSIS — I480 Paroxysmal atrial fibrillation: Secondary | ICD-10-CM | POA: Diagnosis not present

## 2023-07-16 DIAGNOSIS — I10 Essential (primary) hypertension: Secondary | ICD-10-CM | POA: Diagnosis not present

## 2023-07-16 DIAGNOSIS — E7800 Pure hypercholesterolemia, unspecified: Secondary | ICD-10-CM | POA: Diagnosis not present

## 2023-07-16 DIAGNOSIS — I2510 Atherosclerotic heart disease of native coronary artery without angina pectoris: Secondary | ICD-10-CM | POA: Diagnosis not present

## 2023-07-23 ENCOUNTER — Other Ambulatory Visit: Payer: Self-pay | Admitting: *Deleted

## 2023-07-23 ENCOUNTER — Ambulatory Visit (HOSPITAL_COMMUNITY)
Admission: RE | Admit: 2023-07-23 | Discharge: 2023-07-23 | Disposition: A | Discharge status: Home / Self Care | Source: Ambulatory Visit | Attending: Cardiology | Admitting: Cardiology

## 2023-07-23 DIAGNOSIS — I2510 Atherosclerotic heart disease of native coronary artery without angina pectoris: Secondary | ICD-10-CM | POA: Insufficient documentation

## 2023-07-23 DIAGNOSIS — I4819 Other persistent atrial fibrillation: Secondary | ICD-10-CM | POA: Insufficient documentation

## 2023-07-23 DIAGNOSIS — I10 Essential (primary) hypertension: Secondary | ICD-10-CM | POA: Insufficient documentation

## 2023-07-23 DIAGNOSIS — I480 Paroxysmal atrial fibrillation: Secondary | ICD-10-CM | POA: Diagnosis present

## 2023-07-23 DIAGNOSIS — I77810 Thoracic aortic ectasia: Secondary | ICD-10-CM

## 2023-07-23 LAB — ECHOCARDIOGRAM COMPLETE
AR max vel: 3 cm2
AV Area VTI: 2.93 cm2
AV Area mean vel: 2.91 cm2
AV Mean grad: 4 mmHg
AV Peak grad: 6.7 mmHg
Ao pk vel: 1.3 m/s
Area-P 1/2: 3.41 cm2
MV M vel: 3.95 m/s
MV Peak grad: 62.4 mmHg
S' Lateral: 1.8 cm

## 2023-08-10 DIAGNOSIS — I2510 Atherosclerotic heart disease of native coronary artery without angina pectoris: Secondary | ICD-10-CM | POA: Diagnosis not present

## 2023-08-10 DIAGNOSIS — I480 Paroxysmal atrial fibrillation: Secondary | ICD-10-CM | POA: Diagnosis not present

## 2023-08-10 DIAGNOSIS — I10 Essential (primary) hypertension: Secondary | ICD-10-CM | POA: Diagnosis not present

## 2023-08-12 NOTE — Progress Notes (Unsigned)
 Cardiology Clinic Note   Patient Name: Paul Acosta Date of Encounter: 08/13/2023  Primary Care Provider:  Gib Charleston, MD Primary Cardiologist:  Ozell Fell, MD  Patient Profile    Paul Acosta 73 year old male presents to the clinic today for an evaluation of his chest pain.  Past Medical History    Past Medical History:  Diagnosis Date   Basal cell carcinoma 01/28/2015   R post auricular neck - ED&C    Dysplastic nevus 07/27/2009   R med pectoral - mild    Hypertension    Medical history non-contributory    Past Surgical History:  Procedure Laterality Date   ATRIAL FIBRILLATION ABLATION N/A 09/29/2021   Procedure: ATRIAL FIBRILLATION ABLATION;  Surgeon: Inocencio Soyla Lunger, MD;  Location: [facility] INVASIVE CV LAB;  Service: Cardiovascular;  Laterality: N/A;   EYE SURGERY     LEFT HEART CATH AND CORONARY ANGIOGRAPHY N/A 04/21/2019   Procedure: LEFT HEART CATH AND CORONARY ANGIOGRAPHY;  Surgeon: Burnard Debby LABOR, MD;  Location: [facility] INVASIVE CV LAB;  Service: Cardiovascular;  Laterality: N/A;   TOTAL HIP ARTHROPLASTY Right 11/07/2013   Procedure: RIGHT TOTAL HIP ARTHROPLASTY;  Surgeon: Teem JINNY Sensor, MD;  Location: [facility] OR;  Service: Orthopedics;  Laterality: Right;    Allergies  No Known Allergies  History of Present Illness    Paul Acosta has a PMH of NSTEMI 4/21 with PCI and DES to his LAD, AS HK with preserved EF, echocardiogram 3/22 showed an EF of 55-60%, mild LVH, LAE.  He was noted to have dilated aortic root measuring 42 mm and the ascending aorta 41 mm.  His PMH also includes HTN, HLD, paroxysmal atrial fibrillation which was diagnosed 12/22.  He spontaneously converted back to sinus rhythm at follow-up in A-fib clinic.  He had recurrent atrial fibrillation 3/23 and underwent A-fib ablation 9/23.  CHA2DS2-VASc score 3  He was seen in follow-up by Dr. Fell on 10/23/2022.  During that time he noted that he had spinal fusion the prior month at [facility] and noted that  it was a long surgery.  Postoperatively he had recurrent atrial fibrillation and was seen by their cardiology team.  He converted back to sinus rhythm and denied further episodes of irregular or accelerated heartbeat.  He denied chest pain, pressure, shortness of breath.  He recovered well and was noted to be about 4 weeks postoperative.  He was hoping to be released back to regular physical activity.  His blood pressure was noted to be 110/72.  He contacted the cardiology clinic on 05/23/2023.  He noted that he was having a cramp on his right side.  He described chest pain 3 times in the past 30 days.  His pain did not radiate and was not accompanied by any other symptoms.  He was monitoring his blood pressure which was well-controlled.  He had not taken any sublingual nitroglycerin .  He reported that his symptoms were similar to his chest pain prior to having his stent placed 4 years ago.  He was added to my schedule.  He presented to the clinic 06/12/23 for evaluation and stated he  had a couple episodes of chest discomfort over the last couple months.  These happened on his right side around his left shoulder blade.  His episodes lasted for about 30-45 seconds and then dissipated.  His blood pressure had been well-controlled.  We reviewed his previous cardiac catheterization and symptoms that led up to his stenting.  He denied  exertional chest pain.  He did note that he was exercising 5 times per week.  He lifted 2-3 times per week and also used a rowing machine.  His diet was well-controlled.  He was concerned about his weight gain.  He noted that he retired in January.  I will order a BMP, CBC and echocardiogram for further prognostication.  We also discussed performing a stress test possible cardiac PET CT which we will defer at this time.  I will plan follow-up after testing.  His lab work and echocardiogram were reassuring.  He presents to the clinic today for follow-up evaluation and states he has  been doing well.  He is exercising several days per week.  He uses the treadmill 30 minutes every other day and on and off days he is lifting weights.  He reports that he is not using excessively heavy weight.  We reviewed his echocardiogram.  We reviewed his aortic aneurysms.  He is concerned about his recent weight gain.  We discussed BMI and options for weight loss.  He expressed understanding.  He reports that he is concerned about seeing a concert in [place] due to the extreme heat.  He requests a doctor's note to be able to not attend the concert and claim insurance he has purchased.  I will provide this.  We will continue his current medication regimen and plan follow-up in 6 to 9 months.  Today he denies shortness of breath, lower extremity edema, fatigue, palpitations, melena, hematuria, hemoptysis, diaphoresis, weakness, presyncope, syncope, orthopnea, and PND.     Home Medications    Prior to Admission medications   Medication Sig Start Date End Date Taking? Authorizing Provider  acetaminophen  (TYLENOL ) 650 MG CR tablet Take 1,300 mg by mouth every 8 (eight) hours as needed for pain.    [provider]  amLODipine  (NORVASC ) 5 MG tablet TAKE ONE TABLET BY MOUTH DAILY 12/13/20   Wonda Sharper, MD  apixaban  (ELIQUIS ) 5 MG TABS tablet Take 1 tablet (5 mg total) by mouth 2 (two) times daily. 05/23/23   Camnitz, Soyla Lunger, MD  ascorbic acid (VITAMIN C) 1000 MG tablet Take 1,000 mg by mouth daily.    [provider]  Cholecalciferol (VITAMIN D3 GUMMIES PO) Take 5,000 Units by mouth daily.    [provider]  fluticasone (FLONASE) 50 MCG/ACT nasal spray Place 1 spray into both nostrils daily as needed for allergies or rhinitis.    [provider]  ketoconazole  (NIZORAL ) 2 % cream Apply to affected areas at scalp three times weekly at bedtime. 04/09/23   Hester Alm BROCKS, MD  losartan  (COZAAR ) 50 MG tablet TAKE 1 TABLET BY MOUTH DAILY 11/30/22   Wonda Sharper, MD  metoprolol  succinate (TOPROL -XL) 50 MG 24 hr tablet Take 1 tablet (50 mg total) by mouth in the morning and at bedtime. Take with or immediately following a meal. 01/04/23   Camnitz, Soyla Lunger, MD  nitroGLYCERIN  (NITROSTAT ) 0.4 MG SL tablet Place 1 tablet (0.4 mg total) under the tongue every 5 (five) minutes as needed for chest pain. 04/22/19   Swayze, Ava, DO  rosuvastatin  (CRESTOR ) 40 MG tablet TAKE 1 TABLET BY MOUTH DAILY AT 6 IN THE EVENING 11/30/22   Wonda Sharper, MD  Turmeric 500 MG CAPS Take 1 capsule by mouth daily.    [provider]  Zinc 100 MG TABS Take 100 mg by mouth daily.    [provider]    Family History    Family  History  Problem Relation Age of Onset   Stroke Mother 53   Heart attack Father 6   Atrial fibrillation Brother    He indicated that the status of his mother is unknown. He indicated that the status of his father is unknown. He indicated that the status of his brother is unknown.  Social History    Social History   Socioeconomic History   Marital status: Married    Spouse name: Not on file   Number of children: Not on file   Years of education: Not on file   Highest education level: Not on file  Occupational History   Not on file  Tobacco Use   Smoking status: Never   Smokeless tobacco: Never   Tobacco comments:    Never smoke 10/27/21  Substance and Sexual Activity   Alcohol use: Not Currently    Alcohol/week: 6.0 standard drinks of alcohol    Types: 6 Cans of beer per week    Comment: stop drinking 10/13/22   Drug use: Never   Sexual activity: Not on file  Other Topics Concern   Not on file  Social History Narrative   Not on file   Social Drivers of Health   Financial Resource Strain: Low Risk  (09/28/2022)   Received from [facility]   Overall Financial Resource Strain (CARDIA)    Difficulty of Paying Living Expenses: Not hard at all  Food Insecurity: No Food Insecurity  (09/28/2022)   Received from [facility]   Hunger Vital Sign    Within the past 12 months, you worried that your food would run out before you got the money to buy more.: Never true    Within the past 12 months, the food you bought just didn't last and you didn't have money to get more.: Never true  Transportation Needs: No Transportation Needs (09/28/2022)   Received from [facility] - Transportation    In the past 12 months, has lack of transportation kept you from medical appointments or from getting medications?: No    Lack of Transportation (Non-Medical): No  Physical Activity: Not on file  Stress: Not on file  Social Connections: Not on file  Intimate Partner Violence: Not on file     Review of Systems    General:  No chills, fever, night sweats or weight changes.  Cardiovascular:  No chest pain, dyspnea on exertion, edema, orthopnea, palpitations, paroxysmal nocturnal dyspnea. Dermatological: No rash, lesions/masses Respiratory: No cough, dyspnea Urologic: No hematuria, dysuria Abdominal:   No nausea, vomiting, diarrhea, bright red blood per rectum, melena, or hematemesis Neurologic:  No visual changes, wkns, changes in mental status. All other systems reviewed and are otherwise negative except as noted above.  Physical Exam    VS:  BP (!) 122/52   Pulse 70   Ht 6' 6 (1.981 m)   Wt 267 lb 9.6 oz (121.4 kg)   SpO2 97%   BMI 30.92 kg/m  , BMI Body mass index is 30.92 kg/m. GEN: Well nourished, well developed, in no acute distress. HEENT: normal. Neck: Supple, no JVD, carotid bruits, or masses. Cardiac: RRR, no murmurs, rubs, or gallops. No clubbing, cyanosis, edema.  Radials/DP/PT 2+ and equal bilaterally.  Respiratory:  Respirations regular and unlabored, clear to auscultation bilaterally. GI: Soft, nontender, nondistended, BS + x 4. MS: no deformity or atrophy. Skin: warm and dry, no rash. Neuro:  Strength and sensation  are intact. Psych: Normal  affect.  Accessory Clinical Findings    Recent Labs: 06/12/2023: BUN 18; Creatinine, Ser 0.90; Hemoglobin 14.9; Platelets 183; Potassium 4.1; Sodium 140   Recent Lipid Panel    Component Value Date/Time   CHOL 112 04/14/2020 0733   TRIG 63 04/14/2020 0733   HDL 50 04/14/2020 0733   CHOLHDL 2.2 04/14/2020 0733   CHOLHDL 3.6 04/19/2019 1819   VLDL 33 04/19/2019 1819   LDLCALC 48 04/14/2020 0733         ECG personally reviewed by me today-   none today.  Echocardiogram 04/14/2020  IMPRESSIONS     1. Left ventricular ejection fraction, by estimation, is 55 to 60%. Left  ventricular ejection fraction by PLAX is 59 %. The left ventricle has  normal function. The left ventricle has no regional wall motion  abnormalities. There is mild concentric left  ventricular hypertrophy. Left ventricular diastolic parameters were  normal.   2. Right ventricular systolic function is normal. The right ventricular  size is normal.   3. Left atrial size was mildly dilated.   4. The mitral valve is normal in structure. Trivial mitral valve  regurgitation. No evidence of mitral stenosis.   5. The aortic valve is tricuspid. Aortic valve regurgitation is not  visualized. No aortic stenosis is present.   6. Aortic dilatation noted. There is mild dilatation of the aortic root,  measuring 42 mm. There is mild dilatation of the ascending aorta,  measuring 41 mm.   7. The inferior vena cava is normal in size with greater than 50%  respiratory variability, suggesting right atrial pressure of 3 mmHg.   FINDINGS   Left Ventricle: Left ventricular ejection fraction, by estimation, is 55  to 60%. Left ventricular ejection fraction by PLAX is 59 %. The left  ventricle has normal function. The left ventricle has no regional wall  motion abnormalities. The left  ventricular internal cavity size was normal in size. There is mild  concentric left ventricular hypertrophy. Left  ventricular diastolic  parameters were normal. Normal left ventricular filling pressure.   Right Ventricle: The right ventricular size is normal. No increase in  right ventricular wall thickness. Right ventricular systolic function is  normal.   Left Atrium: Left atrial size was mildly dilated.   Right Atrium: Right atrial size was normal in size.   Pericardium: There is no evidence of pericardial effusion.   Mitral Valve: The mitral valve is normal in structure. Trivial mitral  valve regurgitation. No evidence of mitral valve stenosis.   Tricuspid Valve: The tricuspid valve is normal in structure. Tricuspid  valve regurgitation is trivial. No evidence of tricuspid stenosis.   Aortic Valve: The aortic valve is tricuspid. Aortic valve regurgitation is  not visualized. No aortic stenosis is present.   Pulmonic Valve: The pulmonic valve was normal in structure. Pulmonic valve  regurgitation is not visualized. No evidence of pulmonic stenosis.   Aorta: Aortic dilatation noted. There is mild dilatation of the aortic  root, measuring 42 mm. There is mild dilatation of the ascending aorta,  measuring 41 mm.   Venous: The inferior vena cava is normal in size with greater than 50%  respiratory variability, suggesting right atrial pressure of 3 mmHg.   IAS/Shunts: No atrial level shunt detected by color flow Doppler.   Echocardiogram 07/23/2023  IMPRESSIONS     1. Left ventricular ejection fraction, by estimation, is 60 to 65%. Left  ventricular ejection fraction by 3D volume is 68 %. The left ventricle has  normal function. The left ventricle has no regional wall motion  abnormalities. Left ventricular diastolic   parameters were normal.   2. Right ventricular systolic function is normal. The right ventricular  size is mildly enlarged.   3. The mitral valve is normal in structure. Mild mitral valve  regurgitation. No evidence of mitral stenosis.   4. The aortic valve is  tricuspid. Aortic valve regurgitation is not  visualized. No aortic stenosis is present.   5. Aortic dilatation noted. There is mild dilatation of the ascending  aorta, measuring 41 mm.   6. The inferior vena cava is normal in size with greater than 50%  respiratory variability, suggesting right atrial pressure of 3 mmHg.   FINDINGS   Left Ventricle: Left ventricular ejection fraction, by estimation, is 60  to 65%. Left ventricular ejection fraction by 3D volume is 68 %. The left  ventricle has normal function. The left ventricle has no regional wall  motion abnormalities. The left  ventricular internal cavity size was normal in size. There is no left  ventricular hypertrophy. Left ventricular diastolic parameters were  normal.   Right Ventricle: The right ventricular size is mildly enlarged. No  increase in right ventricular wall thickness. Right ventricular systolic  function is normal.   Left Atrium: Left atrial size was normal in size.   Right Atrium: Right atrial size was normal in size.   Pericardium: There is no evidence of pericardial effusion.   Mitral Valve: The mitral valve is normal in structure. Mild mitral valve  regurgitation. No evidence of mitral valve stenosis.   Tricuspid Valve: The tricuspid valve is normal in structure. Tricuspid  valve regurgitation is trivial. No evidence of tricuspid stenosis.   Aortic Valve: The aortic valve is tricuspid. Aortic valve regurgitation is  not visualized. No aortic stenosis is present. Aortic valve mean gradient  measures 4.0 mmHg. Aortic valve peak gradient measures 6.7 mmHg. Aortic  valve area, by VTI measures 2.93  cm.   Pulmonic Valve: The pulmonic valve was normal in structure. Pulmonic valve  regurgitation is not visualized. No evidence of pulmonic stenosis.   Aorta: The aortic root is normal in size and structure and aortic  dilatation noted. There is mild dilatation of the ascending aorta,  measuring 41 mm.    Venous: The inferior vena cava is normal in size with greater than 50%  respiratory variability, suggesting right atrial pressure of 3 mmHg.   IAS/Shunts: No atrial level shunt detected by color flow Doppler.   Additional Comments: 3D was performed not requiring image post processing  on an independent workstation and was normal.    Cardiac event monitor 05/16/2021  Patch Wear Time:  14 days and 0 hours (2023-04-07T09:10:03-398 to 2023-04-21T09:10:07-398)   2 Ventricular Tachycardia runs occurred, the run with the fastest interval lasting 5 beats with a max rate of 156 bpm (avg 121 bpm); the run with the fastest interval was also the longest. Atrial Fibrillation occurred continuously (100% burden), ranging  from 41-196 bpm (avg of 82 bpm). Bundle Branch Block/IVCD was present. Isolated VEs were rare (<1.0%, 1969), VE Couplets were rare (<1.0%, 78), and VE Triplets were rare (<1.0%, 3).   SUMMARY: persistent atrial fibrillation throughout recording. Rare PVC's and ventricular runs up to 5 beats.    RECOMMEND: continue current Rx. EP eval later this month with Dr Inocencio as scheduled.       Assessment & Plan   1.  Coronary artery disease-denies chest pain today.  Denies exertional  chest discomfort.  Continues to exercise regularly. Heart healthy low-sodium diet Maintain physical activity Continue metoprolol , amlodipine , losartan , rosuvastatin   Hyperlipidemia-LDL 57 on 04/05/23.  No aspirin  on apixaban . High-fiber diet Continue rosuvastatin   Essential hypertension-BP today 122/52 Continue blood pressure log Continue amlodipine , losartan , metoprolol   Persistent atrial fibrillation-heart rate today 70.  Denies recent symptoms of atrial fibrillation.  Has not had any bleeding issues.   Continue metoprolol , apixaban   Disposition: Follow-up with Dr. Wonda or me in 6-9 months.   Josefa HERO. Lulia Schriner NP-C     08/13/2023, 4:24 PM [facility] [address] Office [phone] Fax [phone]    I spent 14 minutes examining this patient, reviewing medications, and using patient centered shared decision making involving their cardiac care.   I spent  20 minutes reviewing past medical history,  medications, and prior cardiac tests.

## 2023-08-13 ENCOUNTER — Encounter: Payer: Self-pay | Admitting: General Practice

## 2023-08-13 ENCOUNTER — Ambulatory Visit: Attending: General Practice | Admitting: General Practice

## 2023-08-13 VITALS — BP 122/52 | HR 70 | Ht 78.0 in | Wt 267.6 lb

## 2023-08-13 DIAGNOSIS — I4819 Other persistent atrial fibrillation: Secondary | ICD-10-CM | POA: Insufficient documentation

## 2023-08-13 DIAGNOSIS — E782 Mixed hyperlipidemia: Secondary | ICD-10-CM | POA: Insufficient documentation

## 2023-08-13 DIAGNOSIS — I2510 Atherosclerotic heart disease of native coronary artery without angina pectoris: Secondary | ICD-10-CM | POA: Diagnosis not present

## 2023-08-13 DIAGNOSIS — I10 Essential (primary) hypertension: Secondary | ICD-10-CM | POA: Diagnosis not present

## 2023-08-13 NOTE — Patient Instructions (Signed)
 Medication Instructions:  No medication changes were made at this visit. Continue current regimen.   *If you need a refill on your cardiac medications before your next appointment, please call your pharmacy*  Lab Work: None ordered today. If you have labs (blood work) drawn today and your tests are completely normal, you will receive your results only by: MyChart Message (if you have MyChart) OR A paper copy in the mail If you have any lab test that is abnormal or we need to change your treatment, we will call you to review the results.  Testing/Procedures: None ordered today.  Follow-Up: At [facility], you and your health needs are our priority.  As part of our continuing mission to provide you with exceptional heart care, our providers are all part of one team.  This team includes your primary Cardiologist (physician) and Advanced Practice Providers or APPs (Physician Assistants and Nurse Practitioners) who all work together to provide you with the care you need, when you need it.  Your next appointment:   6 month(s)  Provider:   Ozell Fell, MD or Josefa Beauvais, NP

## 2023-08-16 DIAGNOSIS — I480 Paroxysmal atrial fibrillation: Secondary | ICD-10-CM | POA: Diagnosis not present

## 2023-08-16 DIAGNOSIS — I10 Essential (primary) hypertension: Secondary | ICD-10-CM | POA: Diagnosis not present

## 2023-08-16 DIAGNOSIS — E7800 Pure hypercholesterolemia, unspecified: Secondary | ICD-10-CM | POA: Diagnosis not present

## 2023-08-16 DIAGNOSIS — I2510 Atherosclerotic heart disease of native coronary artery without angina pectoris: Secondary | ICD-10-CM | POA: Diagnosis not present

## 2023-09-14 DIAGNOSIS — I480 Paroxysmal atrial fibrillation: Secondary | ICD-10-CM | POA: Diagnosis not present

## 2023-09-14 DIAGNOSIS — I10 Essential (primary) hypertension: Secondary | ICD-10-CM | POA: Diagnosis not present

## 2023-09-14 DIAGNOSIS — I2510 Atherosclerotic heart disease of native coronary artery without angina pectoris: Secondary | ICD-10-CM | POA: Diagnosis not present

## 2023-09-16 DIAGNOSIS — I10 Essential (primary) hypertension: Secondary | ICD-10-CM | POA: Diagnosis not present

## 2023-09-16 DIAGNOSIS — I2510 Atherosclerotic heart disease of native coronary artery without angina pectoris: Secondary | ICD-10-CM | POA: Diagnosis not present

## 2023-09-16 DIAGNOSIS — I480 Paroxysmal atrial fibrillation: Secondary | ICD-10-CM | POA: Diagnosis not present

## 2023-09-16 DIAGNOSIS — E7800 Pure hypercholesterolemia, unspecified: Secondary | ICD-10-CM | POA: Diagnosis not present

## 2023-10-09 DIAGNOSIS — N529 Male erectile dysfunction, unspecified: Secondary | ICD-10-CM | POA: Diagnosis not present

## 2023-10-09 DIAGNOSIS — G459 Transient cerebral ischemic attack, unspecified: Secondary | ICD-10-CM | POA: Diagnosis not present

## 2023-10-09 DIAGNOSIS — I2510 Atherosclerotic heart disease of native coronary artery without angina pectoris: Secondary | ICD-10-CM | POA: Diagnosis not present

## 2023-10-09 DIAGNOSIS — Z23 Encounter for immunization: Secondary | ICD-10-CM | POA: Diagnosis not present

## 2023-10-09 DIAGNOSIS — Z1331 Encounter for screening for depression: Secondary | ICD-10-CM | POA: Diagnosis not present

## 2023-10-09 DIAGNOSIS — Z125 Encounter for screening for malignant neoplasm of prostate: Secondary | ICD-10-CM | POA: Diagnosis not present

## 2023-10-09 DIAGNOSIS — Z0000 Encounter for general adult medical examination without abnormal findings: Secondary | ICD-10-CM | POA: Diagnosis not present

## 2023-10-09 DIAGNOSIS — Z85828 Personal history of other malignant neoplasm of skin: Secondary | ICD-10-CM | POA: Diagnosis not present

## 2023-10-09 DIAGNOSIS — I10 Essential (primary) hypertension: Secondary | ICD-10-CM | POA: Diagnosis not present

## 2023-10-09 DIAGNOSIS — E7800 Pure hypercholesterolemia, unspecified: Secondary | ICD-10-CM | POA: Diagnosis not present

## 2023-10-14 DIAGNOSIS — I10 Essential (primary) hypertension: Secondary | ICD-10-CM | POA: Diagnosis not present

## 2023-10-14 DIAGNOSIS — I480 Paroxysmal atrial fibrillation: Secondary | ICD-10-CM | POA: Diagnosis not present

## 2023-10-14 DIAGNOSIS — I2510 Atherosclerotic heart disease of native coronary artery without angina pectoris: Secondary | ICD-10-CM | POA: Diagnosis not present

## 2023-10-16 DIAGNOSIS — E7800 Pure hypercholesterolemia, unspecified: Secondary | ICD-10-CM | POA: Diagnosis not present

## 2023-10-16 DIAGNOSIS — I2510 Atherosclerotic heart disease of native coronary artery without angina pectoris: Secondary | ICD-10-CM | POA: Diagnosis not present

## 2023-10-16 DIAGNOSIS — I10 Essential (primary) hypertension: Secondary | ICD-10-CM | POA: Diagnosis not present

## 2023-10-16 DIAGNOSIS — I480 Paroxysmal atrial fibrillation: Secondary | ICD-10-CM | POA: Diagnosis not present

## 2023-10-19 ENCOUNTER — Ambulatory Visit (INDEPENDENT_AMBULATORY_CARE_PROVIDER_SITE_OTHER)

## 2023-10-19 ENCOUNTER — Encounter: Payer: Self-pay | Admitting: Podiatry

## 2023-10-19 ENCOUNTER — Ambulatory Visit (INDEPENDENT_AMBULATORY_CARE_PROVIDER_SITE_OTHER): Admitting: Podiatry

## 2023-10-19 DIAGNOSIS — L84 Corns and callosities: Secondary | ICD-10-CM

## 2023-10-19 DIAGNOSIS — M2042 Other hammer toe(s) (acquired), left foot: Secondary | ICD-10-CM

## 2023-10-19 DIAGNOSIS — S91302A Unspecified open wound, left foot, initial encounter: Secondary | ICD-10-CM | POA: Diagnosis not present

## 2023-10-19 DIAGNOSIS — M205X2 Other deformities of toe(s) (acquired), left foot: Secondary | ICD-10-CM | POA: Diagnosis not present

## 2023-10-19 MED ORDER — SILVER SULFADIAZINE 1 % EX CREA: 1.0000 | TOPICAL_CREAM | Freq: Every day | CUTANEOUS | 0 refills | Status: AC

## 2023-10-19 NOTE — Patient Instructions (Signed)
 More silicone pads can be purchased from:  [email or web address]

## 2023-10-19 NOTE — Progress Notes (Signed)
 Chief Complaint  Patient presents with   Toe Pain    L 2nd toe pain 2 weeks used OTC pads no help. Not diabetic Eliquis     HPI: 73 y.o. male presents today painful lesion left second toe interdigital aspect related to hammertoe and first toe deformity.  He reports getting a blister in this area several weeks ago and has been trying use over-the-counter pads to help.  Does relate history of right flatfoot reconstruction with joint fusions.  Does have some cardiac history, he is otherwise relatively healthy and likes to be active.  On Eliquis .  Past Medical History:  Diagnosis Date   Basal cell carcinoma 01/28/2015   R post auricular neck - ED&C    Dysplastic nevus 07/27/2009   R med pectoral - mild    Hypertension    Medical history non-contributory     Past Surgical History:  Procedure Laterality Date   ATRIAL FIBRILLATION ABLATION N/A 09/29/2021   Procedure: ATRIAL FIBRILLATION ABLATION;  Surgeon: Inocencio Soyla Lunger, MD;  Location: [facility];  Service: Cardiovascular;  Laterality: N/A;   EYE SURGERY     LEFT HEART CATH AND CORONARY ANGIOGRAPHY N/A 04/21/2019   Procedure: LEFT HEART CATH AND CORONARY ANGIOGRAPHY;  Surgeon: Burnard Debby LABOR, MD;  Location: [facility];  Service: Cardiovascular;  Laterality: N/A;   TOTAL HIP ARTHROPLASTY Right 11/07/2013   Procedure: RIGHT TOTAL HIP ARTHROPLASTY;  Surgeon: Hartinger JINNY Sensor, MD;  Location: [facility];  Service: Orthopedics;  Laterality: Right;    No Known Allergies  ROS    Physical Exam: There were no vitals filed for this visit.  General: The patient is alert and oriented x3 in no acute distress.  Dermatology: Left second toe medial PIPJ there is macerated callus present with underlying skin breakdown level of dermal tissue.  No drainage, no erythema.  Kissing lesion present right first toe.  Vascular: Palpable pedal pulses bilaterally. Capillary refill within normal limits.  No appreciable edema.  No erythema or  calor.  Neurological: Light touch sensation somewhat decreased to dorsum of feet.  Decreased vibratory sensation  Musculoskeletal Exam: Left foot hallux rigidus present with some valgus drift of first toe.  Hammertoe contractures of lesser digits, second toe somewhat rigid with lateral deviation.  Lesser toes reducible.  Pes planus foot type.  Radiographic Exam: Left 3 views weightbearing 10/19/2023 Normal osseous mineralization.  Significant arthritic changes to the first MPJ with periarticular spurring.  Lateral angulation of first toe interphalangeal joint.  Second toe hammertoe deformity noted.  Pes planus foot type with [place] joint sag.  Elevated first ray.  Assessment/Plan of Care: 1. Hallux limitus, left   2. Heloma molle   3. Hammer toe of left foot      Meds ordered this encounter  Medications   silver sulfADIAZINE (SILVADENE) 1 % cream    Sig: Apply 1 Application topically daily.    Dispense:  50 g    Refill:  0   None  Discussed clinical findings with patient today.  Radiographs reviewed with patient # Left hallux limitus # Hammertoe left foot - Underlying cause of the helloma molle a left 2nd toe - Discussed use of various toe spacers, shoe modification, use of custom inserts - Did briefly discuss surgical intervention as well.  Likely would require first MPJ arthrodesis, second toe hammertoe correction, possible capsular tendon balancing of third, possible fourth toe - Patient interested in conservative therapy at this time.  He would like to hold off on custom inserts, we will revisit this going forward.  # Interdigital callus left second toe - Underlying skin breakdown -Did palliatively debride the loose adhered macerated overlying callus using a #15 blade without incident -Silvadene bandage applied - Prescription for Silvadene cream sent to patient's pharmacy.   Follow-up as needed symptoms do not improve or worsen.   Chantry Headen L. Lamount MAUL, AACFAS [facility]     [address] [place], [place] [address]                Office [phone]  Fax [phone]

## 2023-10-22 ENCOUNTER — Telehealth: Payer: Self-pay

## 2023-10-22 DIAGNOSIS — I4819 Other persistent atrial fibrillation: Secondary | ICD-10-CM

## 2023-10-22 MED ORDER — METOPROLOL SUCCINATE ER 50 MG PO TB24: 50.0000 mg | ORAL_TABLET | Freq: Two times a day (BID) | ORAL | 1 refills | Status: DC

## 2023-10-22 NOTE — Telephone Encounter (Signed)
 See my chart message

## 2023-10-23 ENCOUNTER — Other Ambulatory Visit: Payer: Self-pay | Admitting: Cardiovascular Disease

## 2023-10-24 MED ORDER — METOPROLOL SUCCINATE ER 50 MG PO TB24: 50.0000 mg | ORAL_TABLET | Freq: Two times a day (BID) | ORAL | 0 refills | Status: DC

## 2023-11-04 IMAGING — CT CT SHOULDER*R* W/O CM
2 of 3 series · 13 of 29 positions shown, 16 images · non-contrast
Comparison: None.

CLINICAL DATA: Preoperative examination on 04/16/2021. Right
shoulder pain with limited range of motion for 3 years.

EXAM:
CT OF THE UPPER RIGHT EXTREMITY WITHOUT CONTRAST
TECHNIQUE: Multidetector CT imaging of the upper right extremity was performed
according to the standard protocol.
RADIATION DOSE REDUCTION: This exam was performed according to the
departmental dose-optimization program which includes automated
exposure control, adjustment of the mA and/or kV according to
patient size and/or use of iterative reconstruction technique.

[Series 10: shoulder 2.00 br40 s3 sag soft · sagittal · 0.41mm/px · 5 of 155 slices shown, 6 images]
[im 52/155  bone]
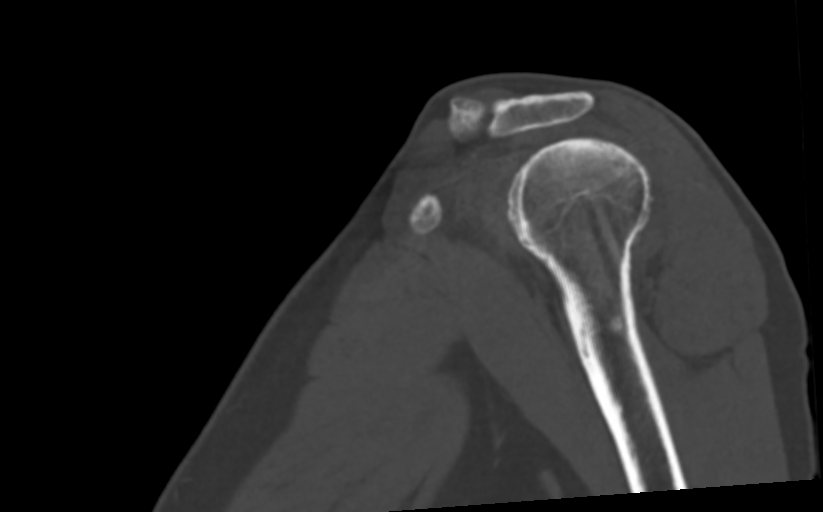
[im 65/155  bone]
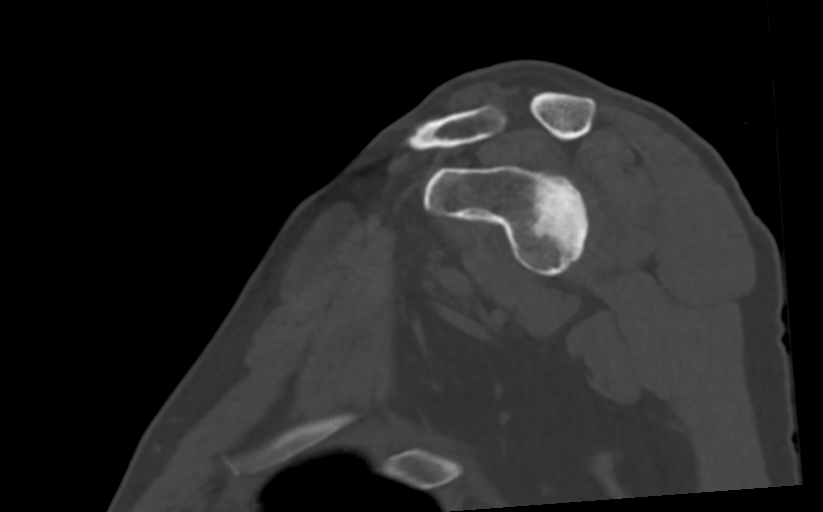
[im 78/155  soft-tissue]
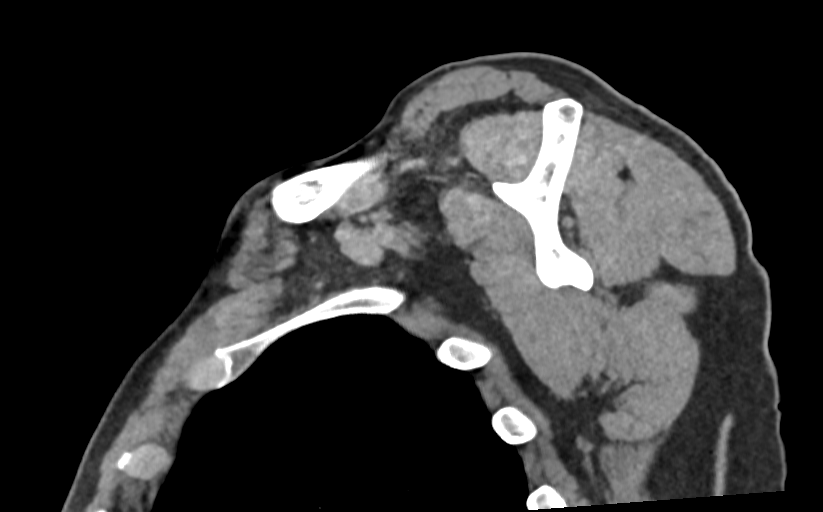
[im 78/155  bone]
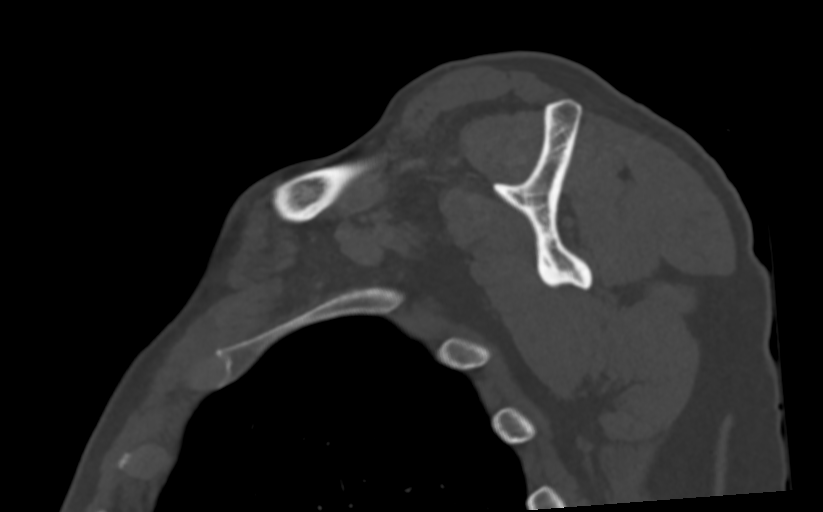
[im 90/155  bone]
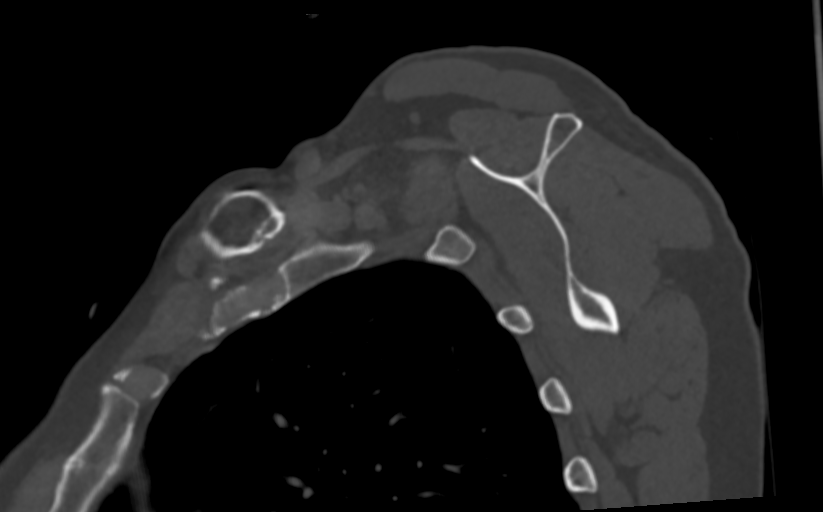
[im 103/155  bone]
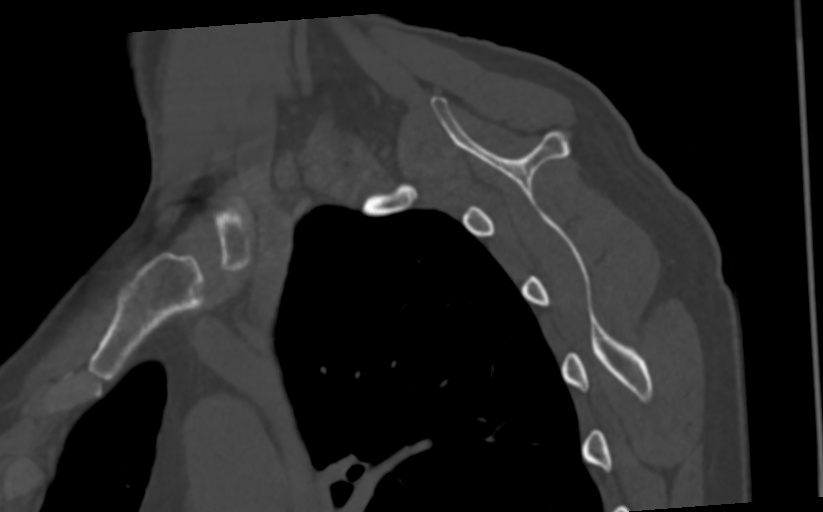

[Series 14: shoulder 0.60 br40 s3 thins soft · axial · 0.61mm/px · z∈[-947,-781]mm · 8 of 346 slices shown, 10 images]
[im 35/346  soft-tissue]
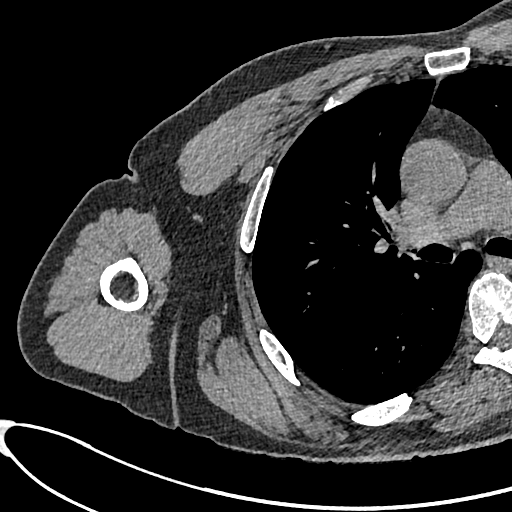
[im 35/346  bone]
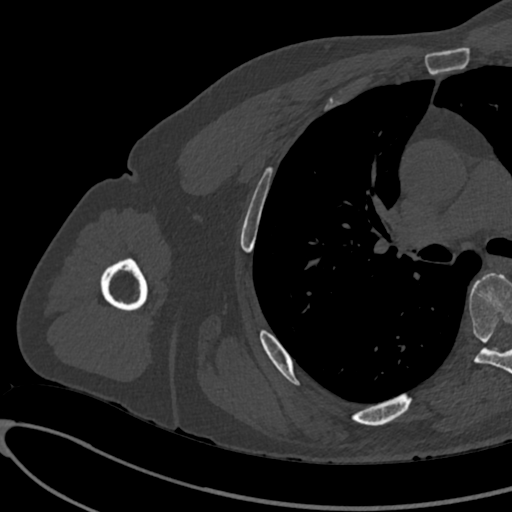
[im 70/346  bone]
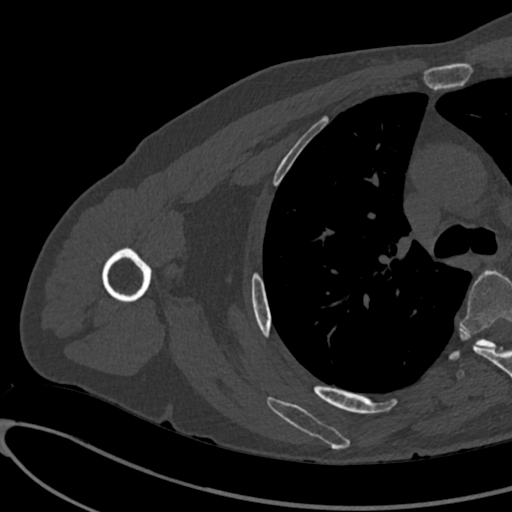
[im 104/346  bone]
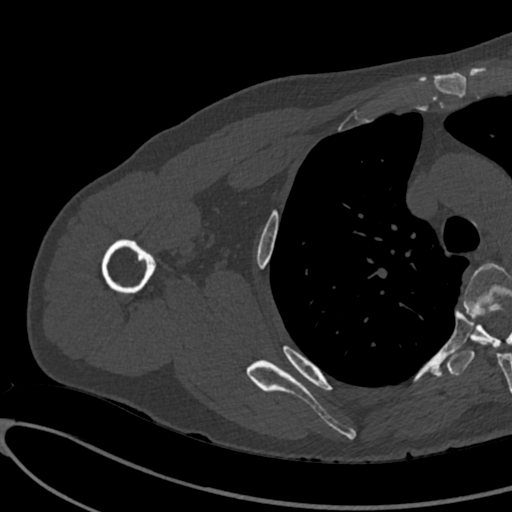
[im 139/346  bone]
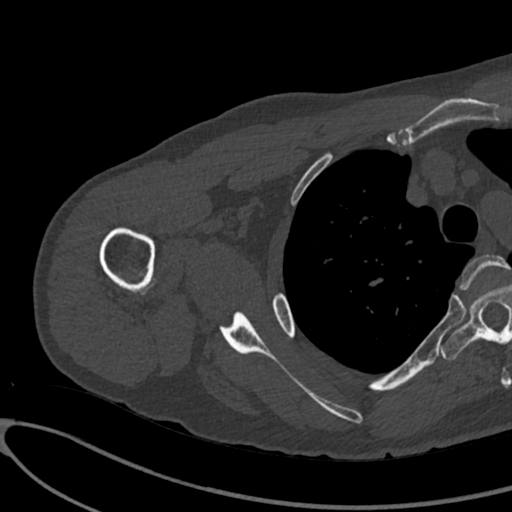
[im 208/346  soft-tissue]
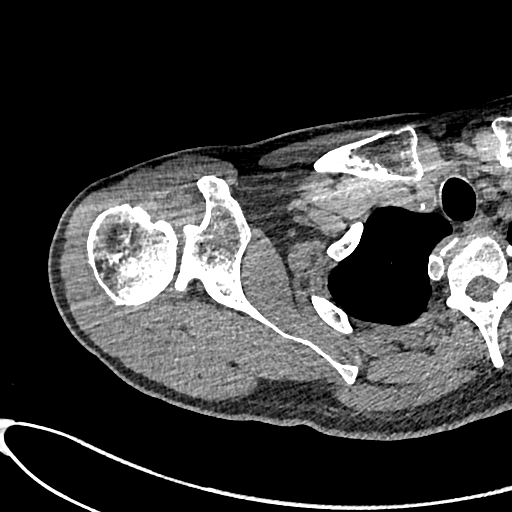
[im 208/346  bone]
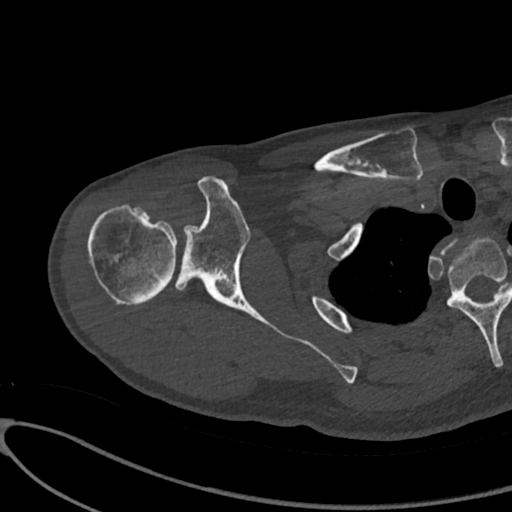
[im 242/346  bone]
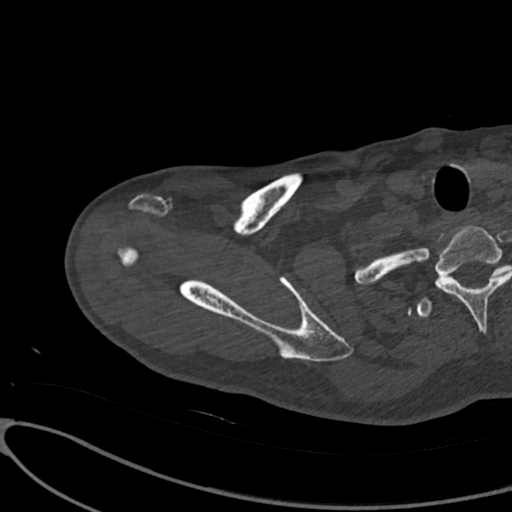
[im 277/346  bone]
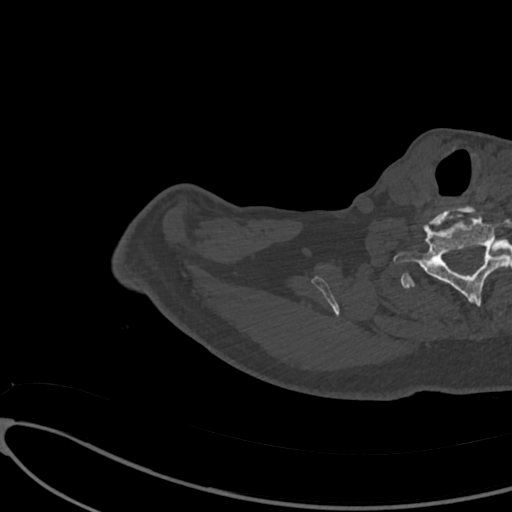
[im 311/346  bone]
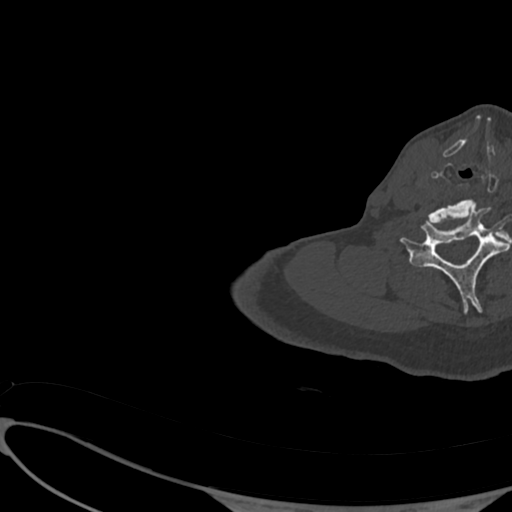

[13 of 29 positions shown; findings below may reference images not displayed]

FINDINGS: Bones/Joint/Cartilage

No fracture or dislocation. Normal alignment. No joint effusion.

Severe osteoarthritis of the glenohumeral joint with joint space
narrowing, subchondral sclerosis, subchondral cystic changes and
marginal osteophytosis.

Mild arthropathy of the acromioclavicular joint.

Right uncovertebral degenerative changes at C6-7 with foraminal
encroachment.

Ligaments

Ligaments are suboptimally evaluated by CT.

Muscles and Tendons
Muscles are normal.  No muscle atrophy.

Soft tissue
No fluid collection or hematoma. No soft tissue mass. Visualized
portions of the lung are clear. Thoracic aortic atherosclerosis.
IMPRESSION: 1. Severe osteoarthritis of the glenohumeral joint.
2. Right uncovertebral degenerative changes at C6-7 with foraminal
encroachment.
3. Aortic Atherosclerosis ([id number]).

## 2023-11-13 DIAGNOSIS — I480 Paroxysmal atrial fibrillation: Secondary | ICD-10-CM | POA: Diagnosis not present

## 2023-11-13 DIAGNOSIS — I2510 Atherosclerotic heart disease of native coronary artery without angina pectoris: Secondary | ICD-10-CM | POA: Diagnosis not present

## 2023-11-13 DIAGNOSIS — I10 Essential (primary) hypertension: Secondary | ICD-10-CM | POA: Diagnosis not present

## 2023-11-14 MED ORDER — APIXABAN 5 MG PO TABS: 5.0000 mg | ORAL_TABLET | Freq: Two times a day (BID) | ORAL | 1 refills | Status: AC

## 2023-11-14 NOTE — Addendum Note (Signed)
 Addended by: BLUFORD RAMP D on: 11/14/2023 12:23 PM   Modules accepted: Orders

## 2023-11-14 NOTE — Telephone Encounter (Signed)
 Prescription refill request for Eliquis  received. Indication:  A-Fib Last office visit:   08/13/2023 Scr:  0.90  last lab on 06/12/2023 Age: 73 yrs. Weight: 121.4 kg  Pt meet protocol and pt's medication was sent to pt's pharmacy for 6 mos.

## 2023-11-16 DIAGNOSIS — I10 Essential (primary) hypertension: Secondary | ICD-10-CM | POA: Diagnosis not present

## 2023-11-16 DIAGNOSIS — I480 Paroxysmal atrial fibrillation: Secondary | ICD-10-CM | POA: Diagnosis not present

## 2023-11-16 DIAGNOSIS — I2510 Atherosclerotic heart disease of native coronary artery without angina pectoris: Secondary | ICD-10-CM | POA: Diagnosis not present

## 2023-11-16 DIAGNOSIS — E7800 Pure hypercholesterolemia, unspecified: Secondary | ICD-10-CM | POA: Diagnosis not present

## 2023-11-19 DIAGNOSIS — Z860101 Personal history of adenomatous and serrated colon polyps: Secondary | ICD-10-CM | POA: Diagnosis not present

## 2023-11-22 ENCOUNTER — Telehealth (HOSPITAL_BASED_OUTPATIENT_CLINIC_OR_DEPARTMENT_OTHER): Payer: Self-pay

## 2023-11-22 NOTE — Telephone Encounter (Signed)
 Please advise holding Eliquis  prior to colonoscopy on 11/19.   Thank you!  DW

## 2023-11-22 NOTE — Telephone Encounter (Signed)
   Pre-operative Risk Assessment    Patient Name: Paul Acosta  DOB: 1950/05/26 MRN: [id number]   Date of last office visit: 08/13/2023 - Josefa Beauvais, NP Date of next office visit: 12/19/2023 - Dr. Inocencio   Request for Surgical Clearance    Procedure:  colonoscopy   Date of Surgery:  Clearance 12/05/23                                 Surgeon:  Dr. Jerrell Sol Surgeon's Group or Practice Name:  [facility]  Phone number:  [phone] Fax number:  [phone]   Type of Clearance Requested:   - Medical  - Pharmacy:  Hold Rivaroxaban (Xarelto) - does not specify   Type of Anesthesia:  Propofol    Additional requests/questions:  N/A  Bonney Patrcia Iverson LITTIE   11/22/2023, 11:54 AM

## 2023-11-23 NOTE — Telephone Encounter (Signed)
   Name: Paul Acosta  DOB: [birth year]  MRN: [id number]  Primary Cardiologist: Ozell Fell, MD   Preoperative team, please contact this patient and set up a phone call appointment for further preoperative risk assessment. Please obtain consent and complete medication review. Thank you for your help.  I confirm that guidance regarding antiplatelet and oral anticoagulation therapy has been completed and, if necessary, noted below.  Per Pharmacy:11/23/2023 Per office protocol, patient can hold Xarelto for 2 days prior to procedure.   Patient will not need bridging with Lovenox  (enoxaparin ) around procedure.  I also confirmed the patient resides in the state of [place] . As per [facility] Medical Board telemedicine laws, the patient must reside in the state in which the provider is licensed.   Paul Satterfield, NP 11/23/2023, 1:41 PM  [facility]

## 2023-11-23 NOTE — Telephone Encounter (Addendum)
 Patient with diagnosis of afib on Eliquis  for anticoagulation.    Procedure:  colonoscopy    Date of Surgery:  Clearance 12/05/23   CHA2DS2-VASc Score = 3   This indicates a 3.2% annual risk of stroke. The patient's score is based upon: CHF History: 0 HTN History: 1 Diabetes History: 0 Stroke History: 0 Vascular Disease History: 1 Age Score: 1 Gender Score: 0    CrCl 108 ml/min  Platelet count 183 K  Patient has not had an Afib/aflutter ablation in the last 3 months, DCCV within the last 4 weeks or a watchman implanted in the last 45 days    Per office protocol, patient can hold Eliquis  for 2 days prior to procedure.   Patient will not need bridging with Lovenox  (enoxaparin ) around procedure.  **This guidance is not considered finalized until pre-operative APP has relayed final recommendations.**

## 2023-11-23 NOTE — Telephone Encounter (Signed)
 Attempted to call patient to schedule for a televisit for a pre-op clearance. No answer left a vm to call back.

## 2023-11-26 ENCOUNTER — Telehealth (HOSPITAL_BASED_OUTPATIENT_CLINIC_OR_DEPARTMENT_OTHER): Payer: Self-pay | Admitting: *Deleted

## 2023-11-26 NOTE — Telephone Encounter (Signed)
 Pt returning call

## 2023-11-26 NOTE — Telephone Encounter (Signed)
 Pt has been scheduled tele preop appt 12/03/23. Pt was advised to begin to hold his blood thinner 12/03/23 as procedure is 12/05/23. Pt verbalized understanding. Med rec and consent are done.    Preop APP Barnie ORN. NP aware pt has been advised to hold blood thinner as of 12/03/23 and resume once surgeon feels it is safe.      Patient Consent for Virtual Visit        Paul Acosta has provided verbal consent on 11/26/2023 for a virtual visit (video or telephone).   CONSENT FOR VIRTUAL VISIT FOR:  Paul Acosta  By participating in this virtual visit I agree to the following:  I hereby voluntarily request, consent and authorize [facility] and its employed or contracted physicians, physician assistants, nurse practitioners or other licensed health care professionals (the Practitioner), to provide me with telemedicine health care services (the "Services) as deemed necessary by the treating Practitioner. I acknowledge and consent to receive the Services by the Practitioner via telemedicine. I understand that the telemedicine visit will involve communicating with the Practitioner through live audiovisual communication technology and the disclosure of certain medical information by electronic transmission. I acknowledge that I have been given the opportunity to request an in-person assessment or other available alternative prior to the telemedicine visit and am voluntarily participating in the telemedicine visit.  I understand that I have the right to withhold or withdraw my consent to the use of telemedicine in the course of my care at any time, without affecting my right to future care or treatment, and that the Practitioner or I may terminate the telemedicine visit at any time. I understand that I have the right to inspect all information obtained and/or recorded in the course of the telemedicine visit and may receive copies of available information for a reasonable fee.  I understand  that some of the potential risks of receiving the Services via telemedicine include:  Delay or interruption in medical evaluation due to technological equipment failure or disruption; Information transmitted may not be sufficient (e.g. poor resolution of images) to allow for appropriate medical decision making by the Practitioner; and/or  In rare instances, security protocols could fail, causing a breach of personal health information.  Furthermore, I acknowledge that it is my responsibility to provide information about my medical history, conditions and care that is complete and accurate to the best of my ability. I acknowledge that Practitioner's advice, recommendations, and/or decision may be based on factors not within their control, such as incomplete or inaccurate data provided by me or distortions of diagnostic images or specimens that may result from electronic transmissions. I understand that the practice of medicine is not an exact science and that Practitioner makes no warranties or guarantees regarding treatment outcomes. I acknowledge that a copy of this consent can be made available to me via my patient portal [facility] MyChart), or I can request a printed copy by calling the office of  [facility].    I understand that my insurance will be billed for this visit.   I have read or had this consent read to me. I understand the contents of this consent, which adequately explains the benefits and risks of the Services being provided via telemedicine.  I have been provided ample opportunity to ask questions regarding this consent and the Services and have had my questions answered to my satisfaction. I give my informed consent for the services to be provided through the use of telemedicine  in my medical care

## 2023-11-26 NOTE — Telephone Encounter (Signed)
 Pt has been scheduled tele preop appt 12/03/23. Pt was advised to begin to hold his blood thinner 12/03/23 as procedure is 12/05/23. Pt verbalized understanding. Med rec and consent are done.    Preop APP Barnie ORN. NP aware pt has been advised to hold blood thinner as of 12/03/23 and resume once surgeon feels it is safe.

## 2023-11-28 ENCOUNTER — Ambulatory Visit: Payer: Medicare Other | Admitting: Dermatology

## 2023-11-28 ENCOUNTER — Encounter: Payer: Self-pay | Admitting: Dermatology

## 2023-11-28 DIAGNOSIS — Z86018 Personal history of other benign neoplasm: Secondary | ICD-10-CM

## 2023-11-28 DIAGNOSIS — L814 Other melanin hyperpigmentation: Secondary | ICD-10-CM | POA: Diagnosis not present

## 2023-11-28 DIAGNOSIS — Z7189 Other specified counseling: Secondary | ICD-10-CM

## 2023-11-28 DIAGNOSIS — W908XXA Exposure to other nonionizing radiation, initial encounter: Secondary | ICD-10-CM | POA: Diagnosis not present

## 2023-11-28 DIAGNOSIS — L578 Other skin changes due to chronic exposure to nonionizing radiation: Secondary | ICD-10-CM

## 2023-11-28 DIAGNOSIS — B351 Tinea unguium: Secondary | ICD-10-CM | POA: Diagnosis not present

## 2023-11-28 DIAGNOSIS — L570 Actinic keratosis: Secondary | ICD-10-CM | POA: Diagnosis not present

## 2023-11-28 DIAGNOSIS — L820 Inflamed seborrheic keratosis: Secondary | ICD-10-CM | POA: Diagnosis not present

## 2023-11-28 DIAGNOSIS — D229 Melanocytic nevi, unspecified: Secondary | ICD-10-CM

## 2023-11-28 DIAGNOSIS — L603 Nail dystrophy: Secondary | ICD-10-CM

## 2023-11-28 DIAGNOSIS — L821 Other seborrheic keratosis: Secondary | ICD-10-CM | POA: Diagnosis not present

## 2023-11-28 DIAGNOSIS — Z1283 Encounter for screening for malignant neoplasm of skin: Secondary | ICD-10-CM | POA: Diagnosis not present

## 2023-11-28 DIAGNOSIS — D1801 Hemangioma of skin and subcutaneous tissue: Secondary | ICD-10-CM | POA: Diagnosis not present

## 2023-11-28 DIAGNOSIS — Z85828 Personal history of other malignant neoplasm of skin: Secondary | ICD-10-CM | POA: Diagnosis not present

## 2023-11-28 DIAGNOSIS — B359 Dermatophytosis, unspecified: Secondary | ICD-10-CM | POA: Diagnosis not present

## 2023-11-28 NOTE — Patient Instructions (Addendum)

## 2023-11-28 NOTE — Progress Notes (Signed)
 Follow-Up Visit   Subjective  Paul Acosta is a 73 y.o. male who presents for the following: Skin Cancer Screening and Full Body Skin Exam. Hx of BCC. Hx of dysplastic nevus. Hx of AKs.   The patient presents for Total-Body Skin Exam (TBSE) for skin cancer screening and mole check. The patient has spots, moles and lesions to be evaluated, some may be new or changing and the patient may have concern these could be cancer.  The following portions of the chart were reviewed this encounter and updated as appropriate: medications, allergies, medical history  Review of Systems:  No other skin or systemic complaints except as noted in HPI or Assessment and Plan.  Objective  Well appearing patient in no apparent distress; mood and affect are within normal limits.  A full examination was performed including scalp, head, eyes, ears, nose, lips, neck, chest, axillae, abdomen, back, buttocks, bilateral upper extremities, bilateral lower extremities, hands, feet, fingers, toes, fingernails, and toenails. All findings within normal limits unless otherwise noted below.   Relevant physical exam findings are noted in the Assessment and Plan.     Scalp x4 (4) Erythematous thin papules/macules with gritty scale.  Left Popliteal Fossa x1, L forearm x1 (2) Erythematous keratotic or waxy stuck-on papule or plaque.  Assessment & Plan   SKIN CANCER SCREENING PERFORMED TODAY.  HISTORY OF BASAL CELL CARCINOMA OF THE SKIN - No evidence of recurrence today - Recommend regular full body skin exams - Recommend daily broad spectrum sunscreen SPF 30+ to sun-exposed areas, reapply every 2 hours as needed.  - Call if any new or changing lesions are noted between office visits   HISTORY OF DYSPLASTIC NEVUS No evidence of recurrence today Recommend regular full body skin exams Recommend daily broad spectrum sunscreen SPF 30+ to sun-exposed areas, reapply every 2 hours as needed.  Call if any new or  changing lesions are noted between office visits  ACTINIC DAMAGE - Chronic condition, secondary to cumulative UV/sun exposure - diffuse scaly erythematous macules with underlying dyspigmentation - Recommend daily broad spectrum sunscreen SPF 30+ to sun-exposed areas, reapply every 2 hours as needed.  - Staying in the shade or wearing long sleeves, sun glasses (UVA+UVB protection) and wide brim hats (4-inch brim around the entire circumference of the hat) are also recommended for sun protection.  - Call for new or changing lesions.  LENTIGINES, SEBORRHEIC KERATOSES, HEMANGIOMAS - Benign normal skin lesions - Benign-appearing - Call for any changes  MELANOCYTIC NEVI - Tan-brown and/or pink-flesh-colored symmetric macules and papules - Benign appearing on exam today - Observation - Call clinic for new or changing moles - Recommend daily use of broad spectrum spf 30+ sunscreen to sun-exposed areas.    Toenail dystrophy Exam: Thickened toenails with subungal debris c/w onychomycosis Chronic and persistent condition with duration or expected duration over one year. Condition is symptomatic/ bothersome to patient. Not currently at goal. Treatment Plan: Nail-Fungal-ID Molecular Diagnostic test performed today.  Discussed with patient their insurance will be billed.  Advised the patient they may get a bill for a portion that's not covered by their insurance.  Should the patient have any issues with their remaining responsibility they will not be sent to collections but KRISTINE will work with them internally on any remaining balance.   Plan treatment pending results.   AK (ACTINIC KERATOSIS) (4) Scalp x4 (4) Actinic keratoses are precancerous spots that appear secondary to cumulative UV radiation exposure/sun exposure over time. They are chronic with  expected duration over 1 year. A portion of actinic keratoses will progress to squamous cell carcinoma of the skin. It is not possible to  reliably predict which spots will progress to skin cancer and so treatment is recommended to prevent development of skin cancer.  Recommend daily broad spectrum sunscreen SPF 30+ to sun-exposed areas, reapply every 2 hours as needed.  Recommend staying in the shade or wearing long sleeves, sun glasses (UVA+UVB protection) and wide brim hats (4-inch brim around the entire circumference of the hat). Call for new or changing lesions. Destruction of lesion - Scalp x4 (4) Complexity: simple   Destruction method: cryotherapy   Informed consent: discussed and consent obtained   Timeout:  patient name, date of birth, surgical site, and procedure verified Lesion destroyed using liquid nitrogen: Yes   Region frozen until ice ball extended beyond lesion: Yes   Outcome: patient tolerated procedure well with no complications   Post-procedure details: wound care instructions given   Additional details:  Prior to procedure, discussed risks of blister formation, small wound, skin dyspigmentation, or rare scar following cryotherapy. Recommend Vaseline ointment to treated areas while healing.   INFLAMED SEBORRHEIC KERATOSIS (2) Left Popliteal Fossa x1, L forearm x1 (2) Symptomatic, irritating, patient would like treated. Destruction of lesion - Left Popliteal Fossa x1, L forearm x1 (2) Complexity: simple   Destruction method: cryotherapy   Informed consent: discussed and consent obtained   Timeout:  patient name, date of birth, surgical site, and procedure verified Lesion destroyed using liquid nitrogen: Yes   Region frozen until ice ball extended beyond lesion: Yes   Outcome: patient tolerated procedure well with no complications   Post-procedure details: wound care instructions given   Additional details:  Prior to procedure, discussed risks of blister formation, small wound, skin dyspigmentation, or rare scar following cryotherapy. Recommend Vaseline ointment to treated areas while healing.   Return  in about 1 year (around 11/27/2024) for TBSE, HxBCC, HxDN, HxAK.  I, Jill Parcell, CMA, am acting as scribe for Alm Rhyme, MD.   Documentation: I have reviewed the above documentation for accuracy and completeness, and I agree with the above.  Alm Rhyme, MD

## 2023-12-01 ENCOUNTER — Other Ambulatory Visit: Payer: Self-pay | Admitting: Cardiovascular Disease

## 2023-12-03 ENCOUNTER — Ambulatory Visit: Attending: Cardiovascular Disease

## 2023-12-03 ENCOUNTER — Telehealth: Payer: Self-pay

## 2023-12-03 DIAGNOSIS — Z01810 Encounter for preprocedural cardiovascular examination: Secondary | ICD-10-CM | POA: Diagnosis not present

## 2023-12-03 DIAGNOSIS — Z79899 Other long term (current) drug therapy: Secondary | ICD-10-CM

## 2023-12-03 NOTE — Progress Notes (Signed)
 Virtual Visit via Telephone Note   Because of TYRA MICHELLE co-morbid illnesses, he is at least at moderate risk for complications without adequate follow up.  This format is felt to be most appropriate for this patient at this time.  Due to technical limitations with video connection (technology), today's appointment will be conducted as an audio only telehealth visit, and Paul Acosta verbally agreed to proceed in this manner.   All issues noted in this document were discussed and addressed.  No physical exam could be performed with this format.  Evaluation Performed:  Preoperative cardiovascular risk assessment _____________   Date:  12/03/2023   Patient ID:  Paul Acosta, DOB 07/13/1950, MRN [id number] Patient Location:  Home Provider location:   Office  Primary Care Provider:  Cristopher Bottcher, NP Primary Cardiologist:  Ozell Fell, MD  Chief Complaint / Patient Profile   73 y.o. y/o male with a h/o coronary artery disease, hyperlipidemia, hypertension, atrial fibrillation who is pending colonoscopy and presents today for telephonic preoperative cardiovascular risk assessment.  History of Present Illness    Paul Acosta is a 72 y.o. male who presents via audio/video conferencing for a telehealth visit today.  Pt was last seen in cardiology clinic on 08/13/2023 by Josefa Beauvais, NP.  At that time Paul Acosta was doing well .  The patient is now pending procedure as outlined above. Since his last visit, he remains stable from a cardiac standpoint.  Today he denies chest pain, shortness of breath, lower extremity edema, fatigue, palpitations, melena, hematuria, hemoptysis, diaphoresis, weakness, presyncope, syncope, orthopnea, and PND.   Past Medical History    Past Medical History:  Diagnosis Date   Basal cell carcinoma 01/28/2015   R post auricular neck - ED&C    Dysplastic nevus 07/27/2009   R med pectoral - mild    Hypertension    Medical history non-contributory     Past Surgical History:  Procedure Laterality Date   ATRIAL FIBRILLATION ABLATION N/A 09/29/2021   Procedure: ATRIAL FIBRILLATION ABLATION;  Surgeon: Inocencio Soyla Lunger, MD;  Location: [facility];  Service: Cardiovascular;  Laterality: N/A;   EYE SURGERY     LEFT HEART CATH AND CORONARY ANGIOGRAPHY N/A 04/21/2019   Procedure: LEFT HEART CATH AND CORONARY ANGIOGRAPHY;  Surgeon: Burnard Debby LABOR, MD;  Location: [facility];  Service: Cardiovascular;  Laterality: N/A;   TOTAL HIP ARTHROPLASTY Right 11/07/2013   Procedure: RIGHT TOTAL HIP ARTHROPLASTY;  Surgeon: Marchuk JINNY Sensor, MD;  Location: [facility] OR;  Service: Orthopedics;  Laterality: Right;    Allergies  No Known Allergies  Home Medications    Prior to Admission medications   Medication Sig Start Date End Date Taking? Authorizing Provider  amLODipine  (NORVASC ) 5 MG tablet TAKE ONE TABLET BY MOUTH DAILY 12/13/20   Fell Ozell, MD  apixaban  (ELIQUIS ) 5 MG TABS tablet Take 1 tablet (5 mg total) by mouth 2 (two) times daily. 11/14/23   Beauvais Josefa CHRISTELLA, NP  ascorbic acid (VITAMIN C) 1000 MG tablet Take 1,000 mg by mouth daily.    [provider]  Cholecalciferol (VITAMIN D3 GUMMIES PO) Take 5,000 Units by mouth daily.    [provider]  fluticasone (FLONASE) 50 MCG/ACT nasal spray Place 1 spray into both nostrils daily as needed for allergies or rhinitis.    [provider]  ketoconazole  (NIZORAL ) 2 % cream Apply to affected areas at scalp three times weekly at bedtime. 04/09/23   Hester,  Alm BROCKS, MD  losartan  (COZAAR ) 50 MG tablet TAKE 1 TABLET BY MOUTH DAILY 11/30/22   Wonda Sharper, MD  metoprolol  succinate (TOPROL -XL) 50 MG 24 hr tablet Take 1 tablet (50 mg total) by mouth in the morning and at bedtime. Take with or immediately following a meal. 10/24/23   Camnitz, Soyla Lunger, MD  nitroGLYCERIN  (NITROSTAT ) 0.4 MG SL tablet Place 1 tablet (0.4 mg total) under the tongue every 5 (five) minutes  as needed for chest pain. 04/22/19   Swayze, Ava, DO  rosuvastatin  (CRESTOR ) 40 MG tablet TAKE 1 TABLET BY MOUTH DAILY AT 6 IN THE EVENING 11/30/22   Wonda Sharper, MD  sildenafil (REVATIO) 20 MG tablet Take 20 mg by mouth.    [provider]  silver sulfADIAZINE (SILVADENE) 1 % cream Apply 1 Application topically daily. 10/19/23   Lamount Ethan CROME, DPM  Turmeric 500 MG CAPS Take 1 capsule by mouth daily.    [provider]  Zinc 100 MG TABS Take 100 mg by mouth daily.    [provider]    Physical Exam    Vital Signs:  Paul Acosta does not have vital signs available for review today.  Given telephonic nature of communication, physical exam is limited. AAOx3. NAD. Normal affect.  Speech and respirations are unlabored.  Accessory Clinical Findings    None  Assessment & Plan    1.  Preoperative Cardiovascular Risk Assessment:colonoscopy    Date of Surgery:  Clearance 12/05/23                                  Surgeon:  Dr. Jerrell Sol Surgeon's Group or Practice Name:  [facility]  Phone number:  [phone] Fax number:  [phone]      Primary Cardiologist: Sharper Wonda, MD  Chart reviewed as part of pre-operative protocol coverage. Given past medical history and time since last visit, based on ACC/AHA guidelines, Paul Acosta would be at acceptable risk for the planned procedure without further cardiovascular testing.   Patient was advised that if he develops new symptoms prior to surgery to contact our office to arrange a follow-up appointment.  He verbalized understanding.  Per office protocol, patient can hold Eliquis  for 2 days prior to procedure.   Patient will not need bridging with Lovenox  (enoxaparin ) around procedure.  I will route this recommendation to the requesting party via Epic fax function and remove from pre-op pool.       Time:   Today, I have spent 5 minutes with the patient with telehealth technology  discussing medical history, symptoms, and management plan.  I spent 10 minutes reviewing patient's past cardiac history and cardiac medications.    Josefa CHRISTELLA Beauvais, NP  12/03/2023, 7:56 AM

## 2023-12-03 NOTE — Telephone Encounter (Signed)
 Patient's molecular pathogen report scanned under media. Please advise.

## 2023-12-04 NOTE — Addendum Note (Signed)
 Addended by: WILLMA KNEE A on: 12/04/2023 11:02 AM   Modules accepted: Orders

## 2023-12-05 DIAGNOSIS — K5730 Diverticulosis of large intestine without perforation or abscess without bleeding: Secondary | ICD-10-CM | POA: Diagnosis not present

## 2023-12-05 DIAGNOSIS — Z09 Encounter for follow-up examination after completed treatment for conditions other than malignant neoplasm: Secondary | ICD-10-CM | POA: Diagnosis not present

## 2023-12-05 DIAGNOSIS — D128 Benign neoplasm of rectum: Secondary | ICD-10-CM | POA: Diagnosis not present

## 2023-12-05 DIAGNOSIS — D125 Benign neoplasm of sigmoid colon: Secondary | ICD-10-CM | POA: Diagnosis not present

## 2023-12-05 DIAGNOSIS — Z860101 Personal history of adenomatous and serrated colon polyps: Secondary | ICD-10-CM | POA: Diagnosis not present

## 2023-12-07 DIAGNOSIS — D125 Benign neoplasm of sigmoid colon: Secondary | ICD-10-CM | POA: Diagnosis not present

## 2023-12-07 DIAGNOSIS — D128 Benign neoplasm of rectum: Secondary | ICD-10-CM | POA: Diagnosis not present

## 2023-12-11 NOTE — Telephone Encounter (Signed)
 Left voicemail to return my call

## 2023-12-16 DIAGNOSIS — I10 Essential (primary) hypertension: Secondary | ICD-10-CM | POA: Diagnosis not present

## 2023-12-16 DIAGNOSIS — I2510 Atherosclerotic heart disease of native coronary artery without angina pectoris: Secondary | ICD-10-CM | POA: Diagnosis not present

## 2023-12-16 DIAGNOSIS — I480 Paroxysmal atrial fibrillation: Secondary | ICD-10-CM | POA: Diagnosis not present

## 2023-12-18 NOTE — Progress Notes (Unsigned)
  Electrophysiology Office Note:   Date:  12/19/2023  ID:  MYRTLE BARNHARD, DOB 16-Aug-1950, MRN [id number]  Primary Cardiologist: Ozell Fell, MD Primary Heart Failure: None Electrophysiologist: Equan Cogbill Gladis Norton, MD      History of Present Illness:   Paul Acosta is a 73 y.o. male with h/o coronary artery disease post LAD stent, dilated aortic root, hypertension, hyperlipidemia, atrial fibrillation seen today for routine electrophysiology followup.   Discussed the use of AI scribe software for clinical note transcription with the patient, who gave verbal consent to proceed.  History of Present Illness Paul Acosta is a 73 year old male with atrial fibrillation who presents for routine follow-up after ablation.  He has not experienced any episodes of atrial fibrillation since undergoing an ablation procedure in 2023. Prior to the procedure, he did not have symptoms of atrial fibrillation. He monitors his blood pressure daily with a device provided by [employer], which reports normal readings.  He is currently on anticoagulation therapy, stating 'if I bump against anything, I look like my mother.' He is frustrated with this side effect.  He has resumed gym activities and is actively trying to maintain his health. He has not experienced any known episodes of atrial fibrillation recently.  he denies chest pain, palpitations, dyspnea, PND, orthopnea, nausea, vomiting, dizziness, syncope, edema, weight gain, or early satiety.   Review of systems complete and found to be negative unless listed in HPI.   EP Information / Studies Reviewed:    EKG is not ordered today. EKG from 06/12/2023 reviewed which showed sinus rhythm, right bundle branch block       Risk Assessment/Calculations:    CHA2DS2-VASc Score = 3   This indicates a 3.2% annual risk of stroke. The patient's score is based upon: CHF History: 0 HTN History: 1 Diabetes History: 0 Stroke History: 0 Vascular Disease  History: 1 Age Score: 1 Gender Score: 0            Physical Exam:   VS:  BP 110/70 (BP Location: Right Arm, Patient Position: Sitting, Cuff Size: Large)   Pulse 73   Ht 6' 7 (2.007 m)   Wt 256 lb 14.4 oz (116.5 kg)   SpO2 96%   BMI 28.94 kg/m    Wt Readings from Last 3 Encounters:  12/19/23 256 lb 14.4 oz (116.5 kg)  08/13/23 267 lb 9.6 oz (121.4 kg)  06/12/23 265 lb (120.2 kg)     GEN: Well nourished, well developed in no acute distress NECK: No JVD; No carotid bruits CARDIAC: Regular rate and rhythm, no murmurs, rubs, gallops RESPIRATORY:  Clear to auscultation without rales, wheezing or rhonchi  ABDOMEN: Soft, non-tender, non-distended EXTREMITIES:  No edema; No deformity   ASSESSMENT AND PLAN:    1.  Persistent atrial fibrillation: Post ablation 09/29/2021.  Had an episode of atrial fibrillation around the time of neck surgery.  No further episodes.  Annell Canty continue current management.  As he has been stable over the last 2 years, we Scherry Laverne see him on an as needed basis.  2.  Secondary hypercoagulable state: On Eliquis   3.  Coronary artery disease: No current chest pain.  Plan per primary cardiology.  4.  Hypertension: Well-controlled  Follow up with EP Team as needed   Signed, Tiras Bianchini Gladis Norton, MD

## 2023-12-19 ENCOUNTER — Encounter: Payer: Self-pay | Admitting: Cardiology

## 2023-12-19 ENCOUNTER — Ambulatory Visit: Attending: Cardiology | Admitting: Cardiology

## 2023-12-19 VITALS — BP 110/70 | HR 73 | Ht 79.0 in | Wt 256.9 lb

## 2023-12-19 DIAGNOSIS — I4819 Other persistent atrial fibrillation: Secondary | ICD-10-CM | POA: Diagnosis present

## 2023-12-19 DIAGNOSIS — I10 Essential (primary) hypertension: Secondary | ICD-10-CM | POA: Diagnosis present

## 2023-12-19 DIAGNOSIS — D6869 Other thrombophilia: Secondary | ICD-10-CM | POA: Diagnosis present

## 2023-12-19 DIAGNOSIS — I2510 Atherosclerotic heart disease of native coronary artery without angina pectoris: Secondary | ICD-10-CM | POA: Diagnosis present

## 2023-12-28 DIAGNOSIS — Z79899 Other long term (current) drug therapy: Secondary | ICD-10-CM | POA: Diagnosis not present

## 2023-12-29 LAB — COMPREHENSIVE METABOLIC PANEL WITH GFR
ALT: 30 IU/L (ref 0–44)
AST: 37 IU/L (ref 0–40)
Albumin: 4.6 g/dL (ref 3.8–4.8)
Alkaline Phosphatase: 55 IU/L (ref 47–123)
BUN/Creatinine Ratio: 19 (ref 10–24)
BUN: 20 mg/dL (ref 8–27)
Bilirubin Total: 0.5 mg/dL (ref 0.0–1.2)
CO2: 25 mmol/L (ref 20–29)
Calcium: 9.7 mg/dL (ref 8.6–10.2)
Chloride: 103 mmol/L (ref 96–106)
Creatinine, Ser: 1.06 mg/dL (ref 0.76–1.27)
Globulin, Total: 2 g/dL (ref 1.5–4.5)
Glucose: 88 mg/dL (ref 70–99)
Potassium: 5.2 mmol/L (ref 3.5–5.2)
Sodium: 141 mmol/L (ref 134–144)
Total Protein: 6.6 g/dL (ref 6.0–8.5)
eGFR: 75 mL/min/1.73 (ref >59)

## 2023-12-31 ENCOUNTER — Ambulatory Visit: Payer: Self-pay | Admitting: Dermatology

## 2023-12-31 DIAGNOSIS — B351 Tinea unguium: Secondary | ICD-10-CM

## 2024-01-01 MED ORDER — TERBINAFINE HCL 250 MG PO TABS: 250.0000 mg | ORAL_TABLET | Freq: Every day | ORAL | 0 refills | Status: AC

## 2024-01-01 NOTE — Telephone Encounter (Addendum)
 Called and discussed results and medication instructions with patient. He verbalized understanding and will send mychart once he has completed 1 month of medication.   Rx sent to Arloa Prior at [facility]    ----- Message from Alm Rhyme, MD sent at 12/31/2023  5:55 PM EST ----- Lab from 12/28/2023 show: CMP is normal.  Liver and kidney tests all norma.  Pt has fungus of feet and toenails per Molecular study and clinical appearance.  May send in oral Lamisil  (generic OK) 250 mg 1 po every day #30 0rf. After finishing this, contact office through [facility] and let us  know if tolerated OK. If OK, may send 2 Rf for a total 3 mos tx.

## 2024-01-04 ENCOUNTER — Encounter: Payer: Self-pay | Admitting: Cardiovascular Disease

## 2024-01-14 ENCOUNTER — Encounter: Payer: Self-pay | Admitting: Cardiology

## 2024-01-14 MED ORDER — METOPROLOL SUCCINATE ER 50 MG PO TB24: 50.0000 mg | ORAL_TABLET | Freq: Two times a day (BID) | ORAL | 2 refills | Status: AC

## 2024-02-04 ENCOUNTER — Ambulatory Visit: Admitting: Cardiovascular Disease

## 2024-02-04 ENCOUNTER — Encounter: Payer: Self-pay | Admitting: Cardiovascular Disease

## 2024-02-04 VITALS — BP 118/60 | HR 61 | Ht 79.0 in | Wt 265.0 lb

## 2024-02-04 DIAGNOSIS — I4819 Other persistent atrial fibrillation: Secondary | ICD-10-CM | POA: Insufficient documentation

## 2024-02-04 DIAGNOSIS — E782 Mixed hyperlipidemia: Secondary | ICD-10-CM | POA: Insufficient documentation

## 2024-02-04 DIAGNOSIS — I10 Essential (primary) hypertension: Secondary | ICD-10-CM | POA: Insufficient documentation

## 2024-02-04 DIAGNOSIS — I2510 Atherosclerotic heart disease of native coronary artery without angina pectoris: Secondary | ICD-10-CM | POA: Diagnosis present

## 2024-02-04 NOTE — Assessment & Plan Note (Addendum)
 Treated with rosuvastatin  40 mg daily.

## 2024-02-04 NOTE — Progress Notes (Signed)
 " Cardiology Office Note:    Date:  02/04/2024   ID:  Paul Acosta, DOB June 24, 1950, MRN [id number]  PCP:  Cristopher Bottcher, NP (Inactive)   [facility] Providers Cardiologist:  Ozell Fell, MD Electrophysiologist:  Will Gladis Norton, MD     Referring MD: Cristopher Bottcher, NP   Chief Complaint  Patient presents with   Atrial Fibrillation    History of Present Illness:    Paul Acosta is a 74 y.o. male presenting for follow-up of coronary artery disease and atrial fibrillation.  The patient underwent A-fib ablation in 2023.  His CHADS2 Vascor is 3.  In 2021 he underwent PCI of the LAD with a drug-eluting stent.  LVEF has been normal, estimated at 55 to 60%.  The patient is here alone today.  He has been doing fine with no exertional symptoms.  He has not had any heart palpitations or any feelings of his prior atrial fibrillation ever since undergoing ablation.  He has had 2 episodes of sharp right sided chest pain that have been fleeting and have occurred at rest.  He has not had any chest pressure like he experienced at the time of his non-STEMI.  He is doing well on apixaban  and had some questions about watchman implantation, but he has not had any bleeding problems or other concerns.  No edema, orthopnea, or PND.  His biggest issue of late has been boredom associated with his retirement.  He is trying to find things to do so that he feels productive.  He exercises regularly.  Current Medications: Active Medications[1]   Allergies:   Patient has no known allergies.   ROS:   Please see the history of present illness.    All other systems reviewed and are negative.  EKGs/Labs/Other Studies Reviewed:    The following studies were reviewed today: Cardiac Studies & Procedures   ______________________________________________________________________________________________ CARDIAC CATHETERIZATION  CARDIAC CATHETERIZATION 04/21/2019  Conclusion  Prox LAD lesion is 95%  stenosed.  Mid LAD-1 lesion is 80% stenosed.  Mid LAD-2 lesion is 30% stenosed.  Ost Cx lesion is 20% stenosed.  Prox Cx to Mid Cx lesion is 40% stenosed.  Mid Cx to Dist Cx lesion is 20% stenosed.  Prox RCA to Mid RCA lesion is 20% stenosed.  A stent was successfully placed.  Post intervention, there is a 0% residual stenosis.  Post intervention, there is a 0% residual stenosis.  Multivessel CAD with 95% proximal LAD stenosis followed by 80% stenosis after the first septal perforating artery and 30% mid LAD stenosis in a large LAD vessel which wraps around the LV apex; 20% smooth ostial stenosis of the circumflex coronary artery followed by 40% somewhat eccentric proximal stenosis and 20% mid stenosis; and dominant RCA with 20% mid stenosis.  Successful percutaneous coronary intervention to the proximal to mid LAD lesions with ultimate insertion of a 3.0 x 32 mm Synergy DES stent postdilated to 3.25 mm with the 95 and 80% stenoses being reduced to 0%.  There is brisk TIMI-3 flow and no evidence for dissection.  LVEDP 31 mm Hg.  RECOMMENDATION: DAPT therapy for minimum of 12 months.  Medical therapy for mild concomitant CAD with optimal blood pressure lowering with target less than 130/80 and ideal less than 120/80, aggressive lipid-lowering therapy with target LDL less than 70.  Findings Coronary Findings Diagnostic  Dominance: Right  Left Anterior Descending Prox LAD lesion is 95% stenosed. Mid LAD-1 lesion is 80% stenosed. Mid LAD-2 lesion is 30%  stenosed.  Left Circumflex Ost Cx lesion is 20% stenosed. Prox Cx to Mid Cx lesion is 40% stenosed. Mid Cx to Dist Cx lesion is 20% stenosed.  Right Coronary Artery Prox RCA to Mid RCA lesion is 20% stenosed.  Intervention  Prox LAD lesion Stent (Also treats lesions: Mid LAD-1) A stent was successfully placed. Post-Intervention Lesion Assessment The intervention was successful. Pre-interventional TIMI flow is 3.  Post-intervention TIMI flow is 3. No complications occurred at this lesion. There is a 0% residual stenosis post intervention.  Mid LAD-1 lesion Stent (Also treats lesions: Prox LAD) See details in Prox LAD lesion. Post-Intervention Lesion Assessment The intervention was successful. Pre-interventional TIMI flow is 3. Post-intervention TIMI flow is 3. No complications occurred at this lesion. There is a 0% residual stenosis post intervention.     ECHOCARDIOGRAM  ECHOCARDIOGRAM COMPLETE 07/23/2023  Narrative ECHOCARDIOGRAM REPORT    Patient Name:   Paul Acosta Date of Exam: 07/23/2023 Medical Rec #:  [id number]     Height:       78.0 in Accession #:    [id number]    Weight:       265.0 lb Date of Birth:  [birth year]     BSA:          2.544 m Patient Age:    72 years      BP:           122/74 mmHg Patient Gender: M             HR:           71 bpm. Exam Location:  [address]  Procedure: 2D Echo, 3D Echo, Cardiac Doppler and Color Doppler (Both Spectral and Color Flow Doppler were utilized during procedure).  Indications:    I71 Ascending Aortic Aneurysm  History:        Patient has prior history of Echocardiogram examinations, most recent 04/14/2020. CAD and Previous Myocardial Infarction, Arrythmias:Atrial Fibrillation, Signs/Symptoms:Dizziness/Lightheadedness; Risk Factors:Family History of Coronary Artery Disease, Hypertension and Dyslipidemia. Atrial Fibrillation status post Ablation (2023), Ascending Aortic Aneurysm.  Sonographer:    Heather Hawks RDCS Referring Phys: JOSEFA CHRISTELLA CLEAVER  IMPRESSIONS   1. Left ventricular ejection fraction, by estimation, is 60 to 65%. Left ventricular ejection fraction by 3D volume is 68 %. The left ventricle has normal function. The left ventricle has no regional wall motion abnormalities. Left ventricular diastolic parameters were normal. 2. Right ventricular systolic function is normal. The right ventricular size is mildly  enlarged. 3. The mitral valve is normal in structure. Mild mitral valve regurgitation. No evidence of mitral stenosis. 4. The aortic valve is tricuspid. Aortic valve regurgitation is not visualized. No aortic stenosis is present. 5. Aortic dilatation noted. There is mild dilatation of the ascending aorta, measuring 41 mm. 6. The inferior vena cava is normal in size with greater than 50% respiratory variability, suggesting right atrial pressure of 3 mmHg.  FINDINGS Left Ventricle: Left ventricular ejection fraction, by estimation, is 60 to 65%. Left ventricular ejection fraction by 3D volume is 68 %. The left ventricle has normal function. The left ventricle has no regional wall motion abnormalities. The left ventricular internal cavity size was normal in size. There is no left ventricular hypertrophy. Left ventricular diastolic parameters were normal.  Right Ventricle: The right ventricular size is mildly enlarged. No increase in right ventricular wall thickness. Right ventricular systolic function is normal.  Left Atrium: Left atrial size was normal in size.  Right Atrium: Right atrial size  was normal in size.  Pericardium: There is no evidence of pericardial effusion.  Mitral Valve: The mitral valve is normal in structure. Mild mitral valve regurgitation. No evidence of mitral valve stenosis.  Tricuspid Valve: The tricuspid valve is normal in structure. Tricuspid valve regurgitation is trivial. No evidence of tricuspid stenosis.  Aortic Valve: The aortic valve is tricuspid. Aortic valve regurgitation is not visualized. No aortic stenosis is present. Aortic valve mean gradient measures 4.0 mmHg. Aortic valve peak gradient measures 6.7 mmHg. Aortic valve area, by VTI measures 2.93 cm.  Pulmonic Valve: The pulmonic valve was normal in structure. Pulmonic valve regurgitation is not visualized. No evidence of pulmonic stenosis.  Aorta: The aortic root is normal in size and structure and  aortic dilatation noted. There is mild dilatation of the ascending aorta, measuring 41 mm.  Venous: The inferior vena cava is normal in size with greater than 50% respiratory variability, suggesting right atrial pressure of 3 mmHg.  IAS/Shunts: No atrial level shunt detected by color flow Doppler.  Additional Comments: 3D was performed not requiring image post processing on an independent workstation and was normal.   LEFT VENTRICLE PLAX 2D LVIDd:         4.40 cm         Diastology LVIDs:         1.80 cm         LV e' medial:    6.96 cm/s LV PW:         1.10 cm         LV E/e' medial:  9.0 LV IVS:        1.00 cm         LV e' lateral:   13.30 cm/s LVOT diam:     2.50 cm         LV E/e' lateral: 4.7 LV SV:         81 LV SV Index:   32 LVOT Area:     4.91 cm        3D Volume EF LV 3D EF:    Left ventricul ar ejection fraction by 3D volume is 68 %.  3D Volume EF: 3D EF:        68 % LV EDV:       157 ml LV ESV:       50 ml LV SV:        108 ml  RIGHT VENTRICLE RV Basal diam:  4.70 cm RV Mid diam:    3.40 cm RV S prime:     11.60 cm/s TAPSE (M-mode): 2.4 cm  LEFT ATRIUM             Index        RIGHT ATRIUM           Index LA diam:        3.90 cm 1.53 cm/m   RA Pressure: 3.00 mmHg LA Vol (A2C):   71.3 ml 28.02 ml/m  RA Area:     21.50 cm LA Vol (A4C):   56.0 ml 22.01 ml/m  RA Volume:   67.80 ml  26.65 ml/m LA Biplane Vol: 67.3 ml 26.45 ml/m AORTIC VALVE AV Area (Vmax):    3.00 cm AV Area (Vmean):   2.91 cm AV Area (VTI):     2.93 cm AV Vmax:           129.50 cm/s AV Vmean:          88.250 cm/s AV VTI:  0.278 m AV Peak Grad:      6.7 mmHg AV Mean Grad:      4.0 mmHg LVOT Vmax:         79.10 cm/s LVOT Vmean:        52.400 cm/s LVOT VTI:          0.166 m LVOT/AV VTI ratio: 0.60  AORTA Ao Root diam: 4.30 cm Ao Asc diam:  4.10 cm  MITRAL VALVE               TRICUSPID VALVE MV Area (PHT): cm         Estimated RAP:  3.00 mmHg MV Decel Time:  223 msec MR Peak grad: 62.4 mmHg    SHUNTS MR Mean grad: 47.0 mmHg    Systemic VTI:  0.17 m MR Vmax:      395.00 cm/s  Systemic Diam: 2.50 cm MR Vmean:     334.0 cm/s MV E velocity: 62.57 cm/s MV A velocity: 53.77 cm/s MV E/A ratio:  1.16  Morene Brownie Electronically signed by Morene Brownie Signature Date/Time: 07/23/2023/10:57:23 AM    Final    MONITORS  LONG TERM MONITOR (3-14 DAYS) 05/16/2021  Narrative Patch Wear Time:  14 days and 0 hours (2023-04-07T09:10:03-398 to 2023-04-21T09:10:07-398)  2 Ventricular Tachycardia runs occurred, the run with the fastest interval lasting 5 beats with a max rate of 156 bpm (avg 121 bpm); the run with the fastest interval was also the longest. Atrial Fibrillation occurred continuously (100% burden), ranging from 41-196 bpm (avg of 82 bpm). Bundle Branch Block/IVCD was present. Isolated VEs were rare (<1.0%, 1969), VE Couplets were rare (<1.0%, 78), and VE Triplets were rare (<1.0%, 3).  SUMMARY: persistent atrial fibrillation throughout recording. Rare PVC's and ventricular runs up to 5 beats.  RECOMMEND: continue current Rx. EP eval later this month with Dr Inocencio as scheduled.       ______________________________________________________________________________________________      EKG:        Recent Labs: 06/12/2023: Hemoglobin 14.9; Platelets 183 12/28/2023: ALT 30; BUN 20; Creatinine, Ser 1.06; Potassium 5.2; Sodium 141  Recent Lipid Panel    Component Value Date/Time   CHOL 112 04/14/2020 0733   TRIG 63 04/14/2020 0733   HDL 50 04/14/2020 0733   CHOLHDL 2.2 04/14/2020 0733   CHOLHDL 3.6 04/19/2019 1819   VLDL 33 04/19/2019 1819   LDLCALC 48 04/14/2020 0733     Risk Assessment/Calculations:    CHA2DS2-VASc Score = 3   This indicates a 3.2% annual risk of stroke. The patient's score is based upon: CHF History: 0 HTN History: 1 Diabetes History: 0 Stroke History: 0 Vascular Disease History: 1 Age Score:  1 Gender Score: 0               Physical Exam:    VS:  BP 118/60 (BP Location: Left Arm)   Pulse 61   Ht 6' 7 (2.007 m)   Wt 265 lb (120.2 kg)   SpO2 96%   BMI 29.85 kg/m     Wt Readings from Last 3 Encounters:  02/04/24 265 lb (120.2 kg)  12/19/23 256 lb 14.4 oz (116.5 kg)  08/13/23 267 lb 9.6 oz (121.4 kg)     GEN:  Well nourished, well developed in no acute distress HEENT: Normal NECK: No JVD; No carotid bruits LYMPHATICS: No lymphadenopathy CARDIAC: RRR, no murmurs, rubs, gallops RESPIRATORY:  Clear to auscultation without rales, wheezing or rhonchi  ABDOMEN: Soft, non-tender, non-distended MUSCULOSKELETAL:  No edema;  No deformity  SKIN: Warm and dry NEUROLOGIC:  Alert and oriented x 3 PSYCHIATRIC:  Normal affect   Assessment & Plan Persistent atrial fibrillation (HCC) Tolerating apixaban , on metoprolol  succinate. No recurrence since his ablation.  Essential hypertension BP well-controlled. He cut back on his metoprolol  because he was having a lot of lightheadedness, now feeling much better.  Mixed hyperlipidemia Treated with rosuvastatin  40 mg daily.  Coronary artery disease involving native coronary artery of native heart without angina pectoris Two episodes of highly atypical sharp right-sided pain has occurred twice, resolving spontaneously, and felt nothing like his prior ischemic event.  Low suspicion for ischemic symptoms.  Patient reassured.  Follow-up 1 year.     Medication Adjustments/Labs and Tests Ordered: Current medicines are reviewed at length with the patient today.  Concerns regarding medicines are outlined above.  No orders of the defined types were placed in this encounter.  No orders of the defined types were placed in this encounter.   Patient Instructions  Medication Instructions:  No medication changes were made at this visit. Continue current regimen.   *If you need a refill on your cardiac medications before your next  appointment, please call your pharmacy*  Lab Work: None ordered today. If you have labs (blood work) drawn today and your tests are completely normal, you will receive your results only by: MyChart Message (if you have MyChart) OR A paper copy in the mail If you have any lab test that is abnormal or we need to change your treatment, we will call you to review the results.  Testing/Procedures: None ordered today.  Follow-Up: At [facility] you and your health needs are our priority.  As part of our continuing mission to provide you with exceptional heart care, our providers are all part of one team.  This team includes your primary Cardiologist (physician) and Advanced Practice Providers or APPs (Physician Assistants and Nurse Practitioners) who all work together to provide you with the care you need, when you need it.  Your next appointment:   1 year(s)  Provider:   Ozell Fell, MD     Signed, Ozell Fell, MD  02/04/2024 2:03 PM     [facility]     [1]  Current Meds  Medication Sig   amLODipine  (NORVASC ) 5 MG tablet TAKE ONE TABLET BY MOUTH DAILY   apixaban  (ELIQUIS ) 5 MG TABS tablet Take 1 tablet (5 mg total) by mouth 2 (two) times daily.   ascorbic acid (VITAMIN C) 1000 MG tablet Take 1,000 mg by mouth daily.   Cholecalciferol (VITAMIN D3 GUMMIES PO) Take 5,000 Units by mouth daily.   fluticasone (FLONASE) 50 MCG/ACT nasal spray Place 1 spray into both nostrils daily as needed for allergies or rhinitis.   ketoconazole  (NIZORAL ) 2 % cream Apply to affected areas at scalp three times weekly at bedtime.   losartan  (COZAAR ) 50 MG tablet TAKE 1 TABLET BY MOUTH DAILY   metoprolol  succinate (TOPROL -XL) 50 MG 24 hr tablet Take 1 tablet (50 mg total) by mouth in the morning and at bedtime. Take with or immediately following a meal. (Patient taking differently: Take 25 mg by mouth daily. Take with or immediately following a meal.)   nitroGLYCERIN  (NITROSTAT )  0.4 MG SL tablet Place 1 tablet (0.4 mg total) under the tongue every 5 (five) minutes as needed for chest pain.   rosuvastatin  (CRESTOR ) 40 MG tablet TAKE 1 TABLET BY MOUTH DAILY AT 6PM   sildenafil (REVATIO) 20 MG tablet Take  20 mg by mouth.   silver  sulfADIAZINE  (SILVADENE ) 1 % cream Apply 1 Application topically daily.   terbinafine  (LAMISIL ) 250 MG tablet Take 1 tablet (250 mg total) by mouth daily.   Turmeric 500 MG CAPS Take 1 capsule by mouth daily.   Zinc 100 MG TABS Take 100 mg by mouth daily.   "

## 2024-02-04 NOTE — Assessment & Plan Note (Addendum)
 Two episodes of highly atypical sharp right-sided pain has occurred twice, resolving spontaneously, and felt nothing like his prior ischemic event.  Low suspicion for ischemic symptoms.  Patient reassured.  Follow-up 1 year.

## 2024-02-04 NOTE — Assessment & Plan Note (Addendum)
 Tolerating apixaban , on metoprolol  succinate. No recurrence since his ablation.

## 2024-02-04 NOTE — Patient Instructions (Signed)

## 2024-04-09 ENCOUNTER — Ambulatory Visit: Admitting: Cardiovascular Disease

## 2024-12-02 ENCOUNTER — Ambulatory Visit: Admitting: Dermatology
